# Patient Record
Sex: Male | Born: 1935 | Race: White | Hispanic: No | Marital: Married | Smoking: Former smoker
Health system: Southern US, Community
[De-identification: ages and names within clinical notes are randomized; demographics above are authoritative.]

## PROBLEM LIST (undated history)

## (undated) DIAGNOSIS — R32 Unspecified urinary incontinence: Secondary | ICD-10-CM

## (undated) DIAGNOSIS — I10 Essential (primary) hypertension: Secondary | ICD-10-CM

## (undated) DIAGNOSIS — I5032 Chronic diastolic (congestive) heart failure: Secondary | ICD-10-CM

## (undated) DIAGNOSIS — E785 Hyperlipidemia, unspecified: Secondary | ICD-10-CM

## (undated) DIAGNOSIS — I350 Nonrheumatic aortic (valve) stenosis: Secondary | ICD-10-CM

## (undated) DIAGNOSIS — M1990 Unspecified osteoarthritis, unspecified site: Secondary | ICD-10-CM

## (undated) DIAGNOSIS — R5382 Chronic fatigue, unspecified: Secondary | ICD-10-CM

## (undated) DIAGNOSIS — F0150 Vascular dementia without behavioral disturbance: Secondary | ICD-10-CM

## (undated) DIAGNOSIS — M1712 Unilateral primary osteoarthritis, left knee: Secondary | ICD-10-CM

## (undated) DIAGNOSIS — I351 Nonrheumatic aortic (valve) insufficiency: Secondary | ICD-10-CM

## (undated) DIAGNOSIS — F0390 Unspecified dementia without behavioral disturbance: Secondary | ICD-10-CM

## (undated) DIAGNOSIS — R0989 Other specified symptoms and signs involving the circulatory and respiratory systems: Secondary | ICD-10-CM

## (undated) DIAGNOSIS — N3281 Overactive bladder: Secondary | ICD-10-CM

## (undated) DIAGNOSIS — R233 Spontaneous ecchymoses: Secondary | ICD-10-CM

## (undated) DIAGNOSIS — R238 Other skin changes: Secondary | ICD-10-CM

## (undated) DIAGNOSIS — F419 Anxiety disorder, unspecified: Secondary | ICD-10-CM

## (undated) DIAGNOSIS — E7800 Pure hypercholesterolemia, unspecified: Secondary | ICD-10-CM

## (undated) DIAGNOSIS — N400 Enlarged prostate without lower urinary tract symptoms: Secondary | ICD-10-CM

## (undated) DIAGNOSIS — J309 Allergic rhinitis, unspecified: Secondary | ICD-10-CM

## (undated) DIAGNOSIS — I2510 Atherosclerotic heart disease of native coronary artery without angina pectoris: Secondary | ICD-10-CM

## (undated) DIAGNOSIS — R2681 Unsteadiness on feet: Secondary | ICD-10-CM

## (undated) DIAGNOSIS — Z952 Presence of prosthetic heart valve: Secondary | ICD-10-CM

## (undated) DIAGNOSIS — R51 Headache: Secondary | ICD-10-CM

## (undated) DIAGNOSIS — K219 Gastro-esophageal reflux disease without esophagitis: Secondary | ICD-10-CM

## (undated) HISTORY — DX: Overactive bladder: N32.81

## (undated) HISTORY — DX: Pure hypercholesterolemia, unspecified: E78.00

## (undated) HISTORY — DX: Other specified symptoms and signs involving the circulatory and respiratory systems: R09.89

## (undated) HISTORY — DX: Unilateral primary osteoarthritis, left knee: M17.12

## (undated) HISTORY — PX: TRANSURETHRAL RESECTION OF PROSTATE: SHX73

## (undated) HISTORY — DX: Essential (primary) hypertension: I10

## (undated) HISTORY — PX: OTHER SURGICAL HISTORY: SHX169

## (undated) HISTORY — PX: CARDIAC CATHETERIZATION: SHX172

## (undated) HISTORY — DX: Nonrheumatic aortic (valve) stenosis: I35.0

## (undated) HISTORY — PX: TURP VAPORIZATION: SUR1397

## (undated) HISTORY — PX: HEMORROIDECTOMY: SUR656

## (undated) HISTORY — DX: Vascular dementia, unspecified severity, without behavioral disturbance, psychotic disturbance, mood disturbance, and anxiety: F01.50

## (undated) HISTORY — PX: EYE SURGERY: SHX253

## (undated) HISTORY — DX: Chronic diastolic (congestive) heart failure: I50.32

## (undated) HISTORY — DX: Hyperlipidemia, unspecified: E78.5

## (undated) HISTORY — DX: Allergic rhinitis, unspecified: J30.9

## (undated) HISTORY — DX: Chronic fatigue, unspecified: R53.82

## (undated) HISTORY — PX: COLONOSCOPY W/ POLYPECTOMY: SHX1380

## (undated) HISTORY — DX: Nonrheumatic aortic (valve) insufficiency: I35.1

## (undated) HISTORY — PX: TONSILLECTOMY: SUR1361

---

## 2003-07-18 ENCOUNTER — Encounter: Admission: RE | Admit: 2003-07-18 | Discharge: 2003-07-18 | Payer: Self-pay | Admitting: Family Medicine

## 2003-09-14 ENCOUNTER — Ambulatory Visit (HOSPITAL_COMMUNITY): Admission: RE | Admit: 2003-09-14 | Discharge: 2003-09-16 | Payer: Self-pay | Admitting: Cardiology

## 2004-06-03 ENCOUNTER — Ambulatory Visit (HOSPITAL_COMMUNITY): Admission: RE | Admit: 2004-06-03 | Discharge: 2004-06-03 | Payer: Self-pay | Admitting: Cardiology

## 2005-05-27 ENCOUNTER — Encounter: Admission: RE | Admit: 2005-05-27 | Discharge: 2005-05-27 | Payer: Self-pay | Admitting: Family Medicine

## 2005-07-15 ENCOUNTER — Ambulatory Visit (HOSPITAL_COMMUNITY): Admission: RE | Admit: 2005-07-15 | Discharge: 2005-07-15 | Payer: Self-pay | Admitting: Cardiology

## 2005-07-15 ENCOUNTER — Encounter: Payer: Self-pay | Admitting: Vascular Surgery

## 2005-12-08 ENCOUNTER — Encounter (INDEPENDENT_AMBULATORY_CARE_PROVIDER_SITE_OTHER): Payer: Self-pay | Admitting: *Deleted

## 2005-12-08 ENCOUNTER — Ambulatory Visit (HOSPITAL_COMMUNITY): Admission: RE | Admit: 2005-12-08 | Discharge: 2005-12-10 | Payer: Self-pay | Admitting: Urology

## 2005-12-11 ENCOUNTER — Emergency Department (HOSPITAL_COMMUNITY): Admission: EM | Admit: 2005-12-11 | Discharge: 2005-12-11 | Payer: Self-pay | Admitting: Emergency Medicine

## 2007-08-12 ENCOUNTER — Inpatient Hospital Stay (HOSPITAL_BASED_OUTPATIENT_CLINIC_OR_DEPARTMENT_OTHER): Admission: RE | Admit: 2007-08-12 | Discharge: 2007-08-12 | Payer: Self-pay | Admitting: Cardiology

## 2007-08-18 ENCOUNTER — Ambulatory Visit (HOSPITAL_COMMUNITY): Admission: AD | Admit: 2007-08-18 | Discharge: 2007-08-19 | Payer: Self-pay | Admitting: Cardiovascular Disease

## 2010-06-25 NOTE — Cardiovascular Report (Signed)
NAMEROOK, MAUE               ACCOUNT NO.:  [id number]   MEDICAL RECORD NO.:  [id number]         PATIENT TYPE:  JCAR   LOCATION:                                 FACILITY:   PHYSICIAN:  Armanda Magic, M.D.     DATE OF BIRTH:  05/09/1933   DATE OF PROCEDURE:  DATE OF DISCHARGE:                            CARDIAC CATHETERIZATION   REFERRING PHYSICIAN:  Donia Guiles, MD   PROCEDURE:  Coronary angiography.   OPERATOR:  Trace Turner, MD   INDICATION:  Exertional shortness of breath and abnormal Cardiolite with  inferoseptal ischemia.   COMPLICATIONS:  None.   IV ACCESS:  Via right femoral artery, 4-French sheath.   IV MEDICATIONS:  1. Versed 1 mg.  2. Fentanyl 25 mcg.   This is a 77 year old male who has a history of coronary artery disease  status post PTCA with stenting of the LAD and circumflex in 2005, who  now presents with exertional shortness of breath.  Stress Cardiolite  study revealed an inferoseptal ischemia.  He now presents for cardiac  catheterization.   PROCEDURE:  The patient was brought to cardiac catheterization  laboratory in a fasting nonsedated state.  Informed consent was  obtained.  The patient was connected to continuous heart rate and pulse  oximetry monitoring and blood pressure monitoring.  The right groin was  prepped and draped in a sterile fashion.  A 1% Xylocaine was used for  local anesthesia.  Using the modified Seldinger technique, a 4-French  sheath was placed in the right femoral artery.  Under fluoroscopic  guidance, a 4-French JL-4 catheter was placed in the vicinity of the  left main coronary artery, but did not engage the ostium.  The catheter  was exchanged out over a guidewire for a 4-French JL-5 catheter, which  successfully engaged the coronary ostium.  Multiple cine films were  taken at 30-degree RAO and 40-degree LAO views.  The catheter was  exchanged out over a guidewire for a 4-French 3-D RCA catheter, which  successfully  engaged the right coronary artery.  Multiple cine films  were taken at 30-degree RAO and 40-degree LAO views.  This catheter was  then exchanged out over a guidewire for a 4-French angled pigtail  catheter, which was placed under fluoroscopic guidance with thinning of  the aortic valve.  After multiple attempts at probing the aortic valve,  the catheter could not cross the aortic valve into the left ventricular  cavity.  The catheter was removed over a guidewire.  At the end of the  procedure, all catheters and sheaths were removed.  Manual compression  was performed until adequate hemostasis was obtained.  The patient was  transferred back to room in stable condition.   RESULTS:  The left main coronary artery is widely patent and bifurcates  into left anterior descending artery and left circumflex artery.   The left anterior descending artery has a stent in the proximal portion  that is widely patent.  It gives rise to a very large first diagonal.  The first diagonal bifurcates into 2 daughter vessels.  The superior  branch then bifurcates again.  The inferior branch also bifurcates  again, and they are all widely patent.  The ongoing LAD has luminal  irregularities and is widely patent and traverses the apex.   The left circumflex gives rise to a very high first obtuse marginal  branch which is widely patent and rather large.  The ongoing circumflex  has luminal irregularities and traverses the AV groove.  It gives rise  to a small obtuse marginal branch and then terminates in a third obtuse  marginal branch, which is widely patent.   The right coronary artery is widely patent throughout the course with  luminal irregularities.  Distally, it bifurcates in a posterior  descending artery and posterior lateral artery.  The posterior lateral  artery is widely patent.  The posterior descending artery shows a 70%  stenosis in the proximal portion.  This does correlate with where the   patient's ischemia was in the inferior septum.   Left ventriculography was not performed because we could not cross the  aortic valve.   ASSESSMENT:  1. One-vessel obstructive coronary artery disease in the posterior      descending artery.  2. Normal left ventricular function by stress Cardiolite study.  3. Aortic valve calcification under fluoroscopy with history of aortic      valve sclerosis and aortic insufficiency.  I could not cross the      aortic valve after multiple attempts.   PLAN:  1. We will discharge to home after IV fluid and bedrest.  2. Check a 2-D echocardiogram to assess the aortic valve as an      outpatient.  3. We will review the films with Dr. Amil Amen in regards to the      posterior descending artery stenosis.  Otherwise, he will follow up      with my nurse practitioner in 2 weeks.      Armanda Magic, M.D.  Electronically Signed     TT/MEDQ  D:  08/12/2007  T:  08/13/2007  Job:  [id number]   cc:   Donia Guiles, M.D.

## 2010-06-28 NOTE — Op Note (Signed)
[name], [name]               ACCOUNT NO.:  [id number]   MEDICAL RECORD NO.:  [id number]          PATIENT TYPE:  OIB   LOCATION:  1416                         FACILITY:  [facility]   PHYSICIAN:  Bertram Millard. Dahlstedt, M.D.DATE OF BIRTH:  November 01, 1933   DATE OF PROCEDURE:  12/08/2005  DATE OF DISCHARGE:                                 OPERATIVE REPORT   PREOPERATIVE DIAGNOSIS:  Benign prostatic hypertrophy.   POSTOPERATIVE DIAGNOSIS:  Benign prostatic hypertrophy.   PROCEDURE:  TURP.   SURGEON:  Bertram Millard. Dahlstedt, M.D.   ANESTHESIA:  General with LMA.   COMPLICATIONS:  None.   SPECIMEN:  Prostate chips to pathology.   ESTIMATED BLOOD LOSS:  Minimal.   BRIEF HISTORY:  A 77 year old gentleman who is status post a TUNA procedure  3 years ago for BPH.  He had mild symptomatic relief.  Since that time, he  has had significant symptoms of an overactive bladder.  Oral  anticholinergics have not been much help.  He recently underwent urodynamic  studies which have shown the patient to have obstruction, but also bladder  instability.  Normally, he does not leak urine.   As the patient has had recurrent symptoms of prostatic outlet obstruction  since his TUNA, it was recommended that we proceed with a more aggressive  gold standard approach.  TURP has been discussed with the patient at  length.  He has received a recent brochure on this, is aware of the risks  and complications which include but are not limited to bleeding, infection,  regrowth of tissue with need for procedure down the road, urinary  incontinence, and overactive bladder symptoms which might necessitate  prolonged use of anticholinergics.  He understands these and desires to  proceed.   DESCRIPTION OF PROCEDURE:  The patient was identified in the holding area,  and received preoperative IV antibiotics there.  He was taken to the  operating room where general anesthetic was administered using LMA.  He was  placed  in the dorsal lithotomy position.  Genitalia and perineum were  prepped and draped.  His urethral meatus was calibrated to 30-French with  Sissy Hoff sounds.  The 28-French resectoscope sheath was placed.  The  bladder was inspected.  Minor trabeculations were noted.  Ureteral orifices  were inspected.  The resectoscope was then placed, and TURP was begun.  The  medium-sized median lobe was resected first from bladder neck to the  verumontanum, preserving the verumontanum as landmark for dissection.  The  dissection was carried down to the surgical capsule.  There was a fair  amount of bleeding.  Before the lateral lobes were taken down hemostasis was  achieved.  The right lobe and anterior prostate were then resected down to  the surgical capsule.  There was a fair amount of apical tissue that was  resected.  Following this, the resection was carried out on the left  prostatic lobe.  Again, dissection was carried down to the surgical capsule.  Chips were evacuated from the bladder.  Careful inspection of the prostatic  fossa was then carried out, and small  bleeders were carefully  electrocoagulated.  Reinspection of the bladder revealed no further chips  present.  The prostatic fossa was again cauterized.  At this point the scope  was removed.  The 22-French Foley catheter with three-way irrigation was  then placed and hooked to CBI.   The patient tolerated procedure well.  Chips were sent for pathologic  review.  He was then awakened and taken to PACU in stable condition.      Bertram Millard. Dahlstedt, M.D.  Electronically Signed     SMD/MEDQ  D:  12/08/2005  T:  12/08/2005  Job:  [id number]

## 2010-06-28 NOTE — Cardiovascular Report (Signed)
[name], [name]               ACCOUNT NO.:  [id number]   MEDICAL RECORD NO.:  [id number]          PATIENT TYPE:  OIB   LOCATION:  2856                         FACILITY:  [facility]   PHYSICIAN:  Armanda Magic, M.D.     DATE OF BIRTH:  August 26, 1933   DATE OF PROCEDURE:  06/04/2004  DATE OF DISCHARGE:  06/03/2004                              CARDIAC CATHETERIZATION   PROCEDURE:  Left heart catheterization ,coronary angiography, left  ventriculography.   OPERATOR:  Armanda Magic, M.D.   INDICATIONS:  Coronary disease. Repeat cardiac catheterization for research  protocol for Costar study.   COMPLICATIONS:  None.   IV ACCESS:  Via right femoral artery 6-French sheath.   This is a very pleasant 77 year old white male with a history of coronary  disease status post PTCA stenting of the mid LAD and proximal OM for  obstructive coronary disease. The patient has been enrolled in the Cozaar  study and now presents for routine cardiac catheterization 9 months status  post angioplasty per the Costar protocol.   The patient is brought to cardiac catheterization laboratory in a fasting  nonsedated state. Informed consent was obtained. The patient was connected  to continuous heart rate and pulse oximetry monitoring and intermittent  blood pressure monitoring. The right groin was prepped and draped in sterile  fashion. One percent Xylocaine was used for local anesthesia. Using the  modified Seldinger technique, a 6-French sheath was placed in the right  femoral artery. Under fluoroscopic guidance, a 6-French JL-4 catheter was  placed in left coronary artery. Multiple cine films were taken in the 30-  degree RAO and 40-degree LAO views. This catheter was exchanged out over a  guidewire for a 6-French JR-4 catheter which was placed under fluoroscopic  guidance in the right coronary artery. Multiple cine films were taken at 30-  degree RAO and 40-degree LAO views. This catheter was exchanged out  over a  guidewire for 6-French angled pigtail catheter which was placed under  fluoroscopic guidance in the left ventricular cavity. Left ventriculography  was performed in 30-degree RAO view using a total of 30 cc of contrast at 15  cc per second. The catheter was then pulled back across the aortic valve  with no significant gradient noted. At the end of procedure, all catheters  and sheaths were removed. Manual compression was performed until adequate  hemostasis was obtained. The patient was transferred back to the room in  stable condition.   RESULTS:  Left main coronary is widely patent and bifurcates into left  anterior descending artery and left circumflex artery. Left anterior  descending artery on initial engagement, the catheter had significant  vasospasm at the ostium, up to about 70%, but on further injection of  contrast, vasospasm resolved, but there was still about a 40-50% narrowing  of the ostial LAD. The ongoing LAD gave rise to two diagonal branches, both  of which are widely patent, and the rest of the LAD traversed the apex and  was widely patent. In  between the first and second diagonals, there is  evidence of a stent in  the mid LAD with a 30% in-stent restenosis. The left  circumflex is widely patent throughout its course, giving rise to a very  high obtuse marginal or ramus branch which has a stent in the ostium and  proximal vessel with 20% in-stent restenosis. The ongoing circumflex is  widely patent and traverses the AV groove, giving rise to a small obtuse  marginal 2 and then a larger obtuse marginal 3 branch which bifurcates into  two daughter branches and is widely patent.   The right coronary is widely patent throughout its course with luminal  irregularities up to 10-20%. It bifurcates distally in a posterior  descending artery and posterior lateral artery, both of which are widely  patent.   Left ventriculography shows normal LV systolic function, EF  60%. Aortic  pressure 120/73 mmHg, LV pressure 131 over 9 mmHg.   ASSESSMENT:  1.  Nonobstructive coronary disease.  2.  Normal left ventricular function.   PLAN:  Discharge home after IV fluid and bedrest. Follow up with nurse  practitioner in 2 weeks for groin check.       TT/MEDQ  D:  06/04/2004  T:  06/04/2004  Job:  [id number]   cc:   [employer]

## 2010-06-28 NOTE — Cardiovascular Report (Signed)
[name], [name]                         ACCOUNT NO.:  [id number]   MEDICAL RECORD NO.:  [id number]                   PATIENT TYPE:  OIB   LOCATION:  6523                                 FACILITY:  [facility]   PHYSICIAN:  Francisca December, M.D.               DATE OF BIRTH:  1933/04/05   DATE OF PROCEDURE:  09/14/2003  DATE OF DISCHARGE:                              CARDIAC CATHETERIZATION   PROCEDURE PERFORMED:  1. PCI/drug-eluting stent implantation mid left anterior descending.  2. PCI/drug-eluting stent implantation proximal ramus intermedius.  3. Percutaneous closure of right femoral artery.   INDICATIONS:  Mr. Viggo Perko is a 77 year old man who Dr. Armanda Magic  has recently completed coronary angiography on revealing a subtotal stenosis  in the mid to distal LAD of the ramus branch.  He has exertional dyspnea and  ischemia by Cardiolite.  He was brought to the catheterization laboratory at  this time to undergo percutaneous revascularization.   PROCEDURAL NOTE:  Through the previously placed 6-French catheter sheath a 6-  French #3.5 CLS guiding catheter was advanced to the ascending aorta where  the left coronary os was engaged.  The patient received initially 5700 units  of heparin resulting in an ACT of 220 and a subsequent dose of 2000 units of  heparin resulting in an ACT of 302 seconds.  He also received a 25  mcg/kg/minute bolus dose of Aggrastat followed by a 0.15 mg/kg/minute  maintenance infusion.  A 0.014 inch SciMed Luge intracoronary guidewire was  passed across the lesion in the mid LAD without difficulty.  Initial balloon  dilatation was performed with a 2.5 x 20 mm SciMed Maverick intracoronary  balloon inflated to 6 atmospheres.  This was deflated and removed.  A 2.75 x  16 mm SciMed Taxus intracoronary stent was advanced into place, carefully  positioned.  It was deployed there to a peak pressure of 9 atmospheres for  approximately 45 seconds.  The Taxus  balloon was then deflated and removed  and a 2.75 x 8 mm SciMed Quantum Maverick intracoronary balloon advanced  into place.  Inflated twice to a peak pressure of 16 atmospheres for not  greater than 40 seconds.  Following this, intravascular ultrasound was  performed with the SciMed Atlantis catheter.  A single mechanical pullback  was obtained while imaging throughout the stented segment.   The Luge wire was then withdrawn and advanced down the ramus intermedius  across the 90% lesion there.  Again, this was without difficulty.  Initial  balloon dilatation was performed with a 2.5 x 20 mm SciMed Maverick  intracoronary balloon.  This was inflated to 6 atmospheres for 55 seconds.  This was deflated and removed and a 2.75 x 24 mm SciMed Taxus intracoronary  drug-eluting stent was advanced into place.  This was carefully positioned  such that the proximal end was at the orifice of the ramus intermedius.  It  was inflated to a peak pressure of 14 atmospheres for 40 seconds.  The stent  balloon was deflated and removed and a 3.0 x 15 mm SciMed Quantum Maverick  intracoronary balloon was advanced into place.  This was inflated to 12  atmospheres for 55 seconds.  Again, intravascular ultrasound was completed  with the Atlantis catheter.  Following confirmation of adequate patency in  orthogonal views both with and without the guidewire in place for both  lesions, the guiding catheter was removed.  A right femoral arteriogram in a  45 degree angulation was performed via the right catheter femoral artery  sheath.  It revealed the femoral artery to be widely patent and the  arteriotomy site to be well above the bifurcation into the profunda femoris  and superficial femoral arteries.  Percutaneous closure was then performed  with the AngioSeal device which provided excellent hemostasis.  The patient  was transported to the recovery area in stable condition with an intact  distal pulse.    Angiography as mentioned.  The initial lesion treated was in the mid portion  of the anterior descending artery and was 80-90% stenotic.  It ended just  before the origin of a moderate sized second diagonal branch.  Following  balloon dilatation and stent implantation there was no visual residual  stenosis.  For the ramus intermedius this was a long lesion measuring about  20 mm in length from the orifice of the vessel down to the mid segment.  The  more tightly stenotic portion which was 90% stenotic was in the distal  portion.  Following balloon dilatation and stent implantation, again, there  was no residual stenosis visible.   Intravascular ultrasound revealed a 2.2 x 2.4 mm lumen distally in the mid  LAD stent.  It was 2.6 x 2.4 proximally in the LAD stent.  In the distal  portion of the ramus intermedius stent it was 2.7 x 2.6 mm and proximally it  was 2.7 x 2.5 mm.  All stents were well opposed to the arterial wall.   FINAL IMPRESSION:  1. Atherosclerotic coronary vascular disease, two vessel.  2. Status post successful PCI and drug-eluting stent implantation mid left     anterior descending and proximal ramus intermedius.  3. Typical angina was not reproduced with device insertion or balloon     inflation.                                               Francisca December, M.D.    JHE/MEDQ  D:  09/14/2003  T:  09/15/2003  Job:  [id number]   cc:   Donia Guiles, M.D.  [address] [place]  [place] [address]  Fax: [phone]

## 2010-06-28 NOTE — Cardiovascular Report (Signed)
NAMEBRYCE, CHEEVER                         ACCOUNT NO.:  [id number]   MEDICAL RECORD NO.:  [id number]                   PATIENT TYPE:  OIB   LOCATION:  2899                                 FACILITY:  [facility]   PHYSICIAN:  Armanda Magic, M.D.                  DATE OF BIRTH:  Apr 17, 1933   DATE OF PROCEDURE:  DATE OF DISCHARGE:                              CARDIAC CATHETERIZATION   REFERRING PHYSICIAN:  Donia Guiles, M.D.   PROCEDURE:  Left heart catheterization, coronary angiography, left  ventriculography.   SURGEON:  Armanda Magic, M.D.   INDICATIONS FOR PROCEDURE:  Shortness of breath, abnormal Cardiolite.   COMPLICATIONS:  None.   IV ACCESS:  Via right femoral artery 6 French sheath.   This is a very pleasant 77 year old white male with a history of  hypertension and chronic fatigue who has been having problems with shortness  of breath recently and was found to have an abnormal Cardiolite.  He was  also found to have some mild to moderate aortic insufficiency by 2-D  echocardiogram.  He now presents for cardiac catheterization because of  abnormal Cardiolite.   The patient was brought to the cardiac catheterization laboratory in a  fasting nonsedated state.  Informed consent was obtained.  The patient was  connected to continuous heart rate and pulse oximeter monitoring and  intermittent blood pressure monitoring.  The right groin was prepped and  draped in a sterile fashion.  Xylocaine 1% was used for local anesthesia.  Using a modified Seldinger technique, a 6 sheath was placed in the right  femoral artery.  Under fluoroscopic guidance, a 6 [place] JL 4 catheter was  placed in the left coronary artery.  Model was made and films were taken at  30 degree RAO, 40 degree LAO views.  Catheter was then exchanged out of a  guidewire for 6 [place] JR4 catheter which was placed under fluoroscopic  guidance in the right coronary artery.  Model was made, films were taken at  30  degree RAO and 40 degree LAO views.  Catheter was then exchanged out of a  guidewire for 6 French angled pigtail catheter which was placed under  fluoroscopy guidance in left ventricular cavity.  Left ventriculography was  performed in 30 degree RAO using a total of 30 mL of contrast at 15  cc/second.  Catheter was then pulled back across the aortic valve with no  significant gradient noted.  By the end of the procedure, the patient went  on to PCI of the LAD and ramus.   RESULTS:  Left main coronary artery is widely patent and trifurcates into a  ramus, left anterior descending and left circumflex branches.  The left  anterior descending coronary artery is patent in its proximal portion and  gives rise to a first small diagonal and then there was a 90% narrowing  between the first and second diagonal.  The second diagonal is patent.  The  ongoing left anterior descending to the apex is patent.  The ramus has a 70%  mid lesion and a moderate size vessel.  The left circumflex is widely patent  throughout its course and gives rise to an obtuse marginal branch which is  fairly large which has a 30% proximal narrowing and bifurcates distally into  two daughter branches.   The right coronary artery has  luminal irregularities up to 20% and  bifurcates distally in a posterior descending artery and posterolateral  artery, both of which are widely patent.   Left ventriculography shows normal left ventricular systolic function,  aortic pressure 133/83 mmHg.  Left ventricular pressure 142/9 mmHg.  LVEDP  in 14 mmHg.   ASSESSMENT:  1. Two vessel obstructive coronary artery disease of the left anterior     descending and ramus.  2. Normal left ventricular function.  3. Dyspnea.   PLAN:  Percutaneous coronary intervention of the left anterior descending  plus or minus ramus.  Start Plavix.  Continue aspirin.  Continue his Mavik,  Altace and hydrochlorothiazide and check a fasting lipid  panel.                                               Armanda Magic, M.D.    TT/MEDQ  D:  09/14/2003  T:  09/15/2003  Job:  [id number]   cc:   Donia Guiles, M.D.  [address] [place]  [place] [address]  Fax: [phone]

## 2010-11-07 LAB — BASIC METABOLIC PANEL
BUN: 13
BUN: 13
CO2: 24
CO2: 26
Chloride: 105
Chloride: 106
Creatinine, Ser: 0.85
Glucose, Bld: 117 — ABNORMAL HIGH
Potassium: 4.1

## 2010-11-07 LAB — CBC
HCT: 42.4
Hemoglobin: 14
MCHC: 34.4
MCHC: 34.6
MCHC: 34.6
MCV: 92.6
MCV: 92.8
MCV: 92.9
Platelets: 165
Platelets: 182
RBC: 4.39
RBC: 4.57
RDW: 13.3
WBC: 4.8

## 2011-04-08 ENCOUNTER — Other Ambulatory Visit: Payer: Self-pay | Admitting: Sports Medicine

## 2011-04-09 ENCOUNTER — Ambulatory Visit
Admission: RE | Admit: 2011-04-09 | Discharge: 2011-04-09 | Disposition: A | Discharge status: Home / Self Care | Payer: Medicare Other | Source: Ambulatory Visit | Attending: Sports Medicine | Admitting: Sports Medicine

## 2011-04-11 ENCOUNTER — Other Ambulatory Visit: Payer: Self-pay

## 2011-04-11 HISTORY — PX: ROTATOR CUFF REPAIR: SHX139

## 2011-05-06 ENCOUNTER — Ambulatory Visit (HOSPITAL_COMMUNITY)
Admission: RE | Admit: 2011-05-06 | Discharge: 2011-05-06 | Disposition: A | Discharge status: Home / Self Care | Payer: Medicare Other | Source: Ambulatory Visit | Attending: Anesthesiology | Admitting: Anesthesiology

## 2011-05-06 ENCOUNTER — Encounter (HOSPITAL_COMMUNITY): Payer: Self-pay

## 2011-05-06 ENCOUNTER — Encounter (HOSPITAL_COMMUNITY)
Admission: RE | Admit: 2011-05-06 | Discharge: 2011-05-06 | Disposition: A | Discharge status: Home / Self Care | Payer: Medicare Other | Source: Ambulatory Visit | Attending: Orthopedic Surgery | Admitting: Orthopedic Surgery

## 2011-05-06 DIAGNOSIS — I517 Cardiomegaly: Secondary | ICD-10-CM | POA: Insufficient documentation

## 2011-05-06 DIAGNOSIS — Z01818 Encounter for other preprocedural examination: Secondary | ICD-10-CM | POA: Insufficient documentation

## 2011-05-06 DIAGNOSIS — Z01812 Encounter for preprocedural laboratory examination: Secondary | ICD-10-CM | POA: Insufficient documentation

## 2011-05-06 DIAGNOSIS — M47814 Spondylosis without myelopathy or radiculopathy, thoracic region: Secondary | ICD-10-CM | POA: Insufficient documentation

## 2011-05-06 HISTORY — DX: Gastro-esophageal reflux disease without esophagitis: K21.9

## 2011-05-06 HISTORY — DX: Headache: R51

## 2011-05-06 HISTORY — DX: Atherosclerotic heart disease of native coronary artery without angina pectoris: I25.10

## 2011-05-06 HISTORY — DX: Essential (primary) hypertension: I10

## 2011-05-06 HISTORY — DX: Anxiety disorder, unspecified: F41.9

## 2011-05-06 HISTORY — DX: Benign prostatic hyperplasia without lower urinary tract symptoms: N40.0

## 2011-05-06 LAB — COMPREHENSIVE METABOLIC PANEL
BUN: 13 mg/dL (ref 6–23)
CO2: 28 mEq/L (ref 19–32)
Calcium: 9.6 mg/dL (ref 8.4–10.5)
Creatinine, Ser: 0.81 mg/dL (ref 0.50–1.35)
GFR calc Af Amer: >90 mL/min (ref >90)
GFR calc non Af Amer: 84 mL/min — ABNORMAL LOW (ref >90)
Glucose, Bld: 107 mg/dL — ABNORMAL HIGH (ref 70–99)
Total Bilirubin: 1.3 mg/dL — ABNORMAL HIGH (ref 0.3–1.2)

## 2011-05-06 LAB — CBC
Hemoglobin: 15.5 g/dL (ref 13.0–17.0)
MCHC: 35.4 g/dL (ref 30.0–36.0)
Platelets: 211 10*3/uL (ref 150–400)
RBC: 4.79 MIL/uL (ref 4.22–5.81)

## 2011-05-06 NOTE — Pre-Procedure Instructions (Signed)
20 [name]  05/06/2011   Your procedure is scheduled on:  May 08, 2011  Report to [facility] at 0800 AM.  Call this number if you have problems the morning of surgery: [phone]   Remember:   Do not eat food:After Midnight.  May have clear liquids: up to 4 Hours before arrival.  Clear liquids include soda, tea, black coffee, apple or grape juice, broth.  Take these medicines the morning of surgery with A SIP OF WATER: Norvasc, Coreg, Prilosec,    Do not wear jewelry, make-up or nail polish.  Do not wear lotions, powders, or perfumes. You may wear deodorant.  Do not shave 48 hours prior to surgery.  Do not bring valuables to the hospital.  Contacts, dentures or bridgework may not be worn into surgery.  Leave suitcase in the car. After surgery it may be brought to your room.  For patients admitted to the hospital, checkout time is 11:00 AM the day of discharge.   Patients discharged the day of surgery will not be allowed to drive home.  Name and phone number of your driver: Francesco Provencal  Special Instructions: Incentive Spirometry - Practice and bring it with you on the day of surgery. and CHG Shower Use Special Wash: 1/2 bottle night before surgery and 1/2 bottle morning of surgery.   Please read over the following fact sheets that you were given: Pain Booklet, Coughing and Deep Breathing and Surgical Site Infection Prevention

## 2011-05-07 MED ORDER — CEFAZOLIN SODIUM-DEXTROSE 2-3 GM-% IV SOLR
2.0000 g | INTRAVENOUS | Status: AC
  Administered 2011-05-08: 2 g via INTRAVENOUS
  Filled 2011-05-07: qty 50

## 2011-05-07 MED ORDER — LACTATED RINGERS IV SOLN
INTRAVENOUS | Status: DC
  Administered 2011-05-08: 50 mL/h via INTRAVENOUS

## 2011-05-07 MED ORDER — CHLORHEXIDINE GLUCONATE 4 % EX LIQD: 60.0000 mL | Freq: Once | CUTANEOUS | Status: DC

## 2011-05-08 ENCOUNTER — Encounter (HOSPITAL_COMMUNITY): Payer: Self-pay | Admitting: Anesthesiology

## 2011-05-08 ENCOUNTER — Encounter (HOSPITAL_COMMUNITY): Payer: Self-pay | Admitting: Surgery

## 2011-05-08 ENCOUNTER — Other Ambulatory Visit: Payer: Self-pay

## 2011-05-08 ENCOUNTER — Ambulatory Visit (HOSPITAL_COMMUNITY)
Admission: RE | Admit: 2011-05-08 | Discharge: 2011-05-08 | Disposition: A | Discharge status: Home / Self Care | Payer: Medicare Other | Source: Ambulatory Visit | Attending: Orthopedic Surgery | Admitting: Orthopedic Surgery

## 2011-05-08 ENCOUNTER — Ambulatory Visit (HOSPITAL_COMMUNITY): Payer: Medicare Other | Admitting: Anesthesiology

## 2011-05-08 ENCOUNTER — Encounter (HOSPITAL_COMMUNITY)
Admission: RE | Disposition: A | Discharge status: Home / Self Care | Payer: Self-pay | Source: Ambulatory Visit | Attending: Orthopedic Surgery

## 2011-05-08 DIAGNOSIS — I10 Essential (primary) hypertension: Secondary | ICD-10-CM | POA: Insufficient documentation

## 2011-05-08 DIAGNOSIS — K219 Gastro-esophageal reflux disease without esophagitis: Secondary | ICD-10-CM | POA: Insufficient documentation

## 2011-05-08 DIAGNOSIS — Z9889 Other specified postprocedural states: Secondary | ICD-10-CM

## 2011-05-08 DIAGNOSIS — I359 Nonrheumatic aortic valve disorder, unspecified: Secondary | ICD-10-CM | POA: Insufficient documentation

## 2011-05-08 DIAGNOSIS — M25819 Other specified joint disorders, unspecified shoulder: Secondary | ICD-10-CM | POA: Insufficient documentation

## 2011-05-08 DIAGNOSIS — M249 Joint derangement, unspecified: Secondary | ICD-10-CM | POA: Insufficient documentation

## 2011-05-08 DIAGNOSIS — I2510 Atherosclerotic heart disease of native coronary artery without angina pectoris: Secondary | ICD-10-CM | POA: Insufficient documentation

## 2011-05-08 DIAGNOSIS — M719 Bursopathy, unspecified: Secondary | ICD-10-CM | POA: Insufficient documentation

## 2011-05-08 DIAGNOSIS — M19019 Primary osteoarthritis, unspecified shoulder: Secondary | ICD-10-CM | POA: Insufficient documentation

## 2011-05-08 DIAGNOSIS — R51 Headache: Secondary | ICD-10-CM | POA: Insufficient documentation

## 2011-05-08 DIAGNOSIS — F411 Generalized anxiety disorder: Secondary | ICD-10-CM | POA: Insufficient documentation

## 2011-05-08 DIAGNOSIS — M67919 Unspecified disorder of synovium and tendon, unspecified shoulder: Secondary | ICD-10-CM | POA: Insufficient documentation

## 2011-05-08 DIAGNOSIS — M24119 Other articular cartilage disorders, unspecified shoulder: Secondary | ICD-10-CM | POA: Insufficient documentation

## 2011-05-08 SURGERY — SHOULDER ARTHROSCOPY WITH ROTATOR CUFF REPAIR AND SUBACROMIAL DECOMPRESSION
Anesthesia: General | Site: Shoulder | Laterality: Right | Wound class: Clean

## 2011-05-08 MED ORDER — MIDAZOLAM HCL 5 MG/5ML IJ SOLN
INTRAMUSCULAR | Status: DC | PRN
  Administered 2011-05-08 (×2): 1 mg via INTRAVENOUS

## 2011-05-08 MED ORDER — CYCLOBENZAPRINE HCL 10 MG PO TABS: 10.0000 mg | ORAL_TABLET | Freq: Three times a day (TID) | ORAL | Status: AC | PRN

## 2011-05-08 MED ORDER — TEMAZEPAM 30 MG PO CAPS: 30.0000 mg | ORAL_CAPSULE | Freq: Every evening | ORAL | Status: DC | PRN

## 2011-05-08 MED ORDER — HYDROMORPHONE HCL PF 1 MG/ML IJ SOLN
0.2500 mg | INTRAMUSCULAR | Status: DC | PRN
  Administered 2011-05-08: 0.5 mg via INTRAVENOUS

## 2011-05-08 MED ORDER — MIDAZOLAM HCL 2 MG/2ML IJ SOLN
INTRAMUSCULAR | Status: AC
  Filled 2011-05-08: qty 2

## 2011-05-08 MED ORDER — GLYCOPYRROLATE 0.2 MG/ML IJ SOLN
INTRAMUSCULAR | Status: DC | PRN
  Administered 2011-05-08: 0.4 mg via INTRAVENOUS

## 2011-05-08 MED ORDER — ROPIVACAINE HCL 5 MG/ML IJ SOLN
INTRAMUSCULAR | Status: DC | PRN
  Administered 2011-05-08: 150 mg via EPIDURAL

## 2011-05-08 MED ORDER — ONDANSETRON HCL 4 MG/2ML IJ SOLN
INTRAMUSCULAR | Status: DC | PRN
  Administered 2011-05-08: 4 mg via INTRAVENOUS

## 2011-05-08 MED ORDER — NEOSTIGMINE METHYLSULFATE 1 MG/ML IJ SOLN
INTRAMUSCULAR | Status: DC | PRN
  Administered 2011-05-08: 3 mg via INTRAVENOUS

## 2011-05-08 MED ORDER — OXYCODONE-ACETAMINOPHEN 5-325 MG PO TABS: 1.0000 | ORAL_TABLET | ORAL | Status: AC | PRN

## 2011-05-08 MED ORDER — PROPOFOL 10 MG/ML IV EMUL
INTRAVENOUS | Status: DC | PRN
  Administered 2011-05-08: 200 mg via INTRAVENOUS

## 2011-05-08 MED ORDER — LACTATED RINGERS IV SOLN
INTRAVENOUS | Status: DC | PRN
  Administered 2011-05-08: 10:00:00 via INTRAVENOUS

## 2011-05-08 MED ORDER — NAPROXEN 500 MG PO TABS: 500.0000 mg | ORAL_TABLET | Freq: Two times a day (BID) | ORAL | Status: DC

## 2011-05-08 MED ORDER — EPINEPHRINE HCL 0.1 MG/ML IJ SOLN
INTRAMUSCULAR | Status: DC | PRN
  Administered 2011-05-08: 15 ug via INTRAVENOUS

## 2011-05-08 MED ORDER — ROCURONIUM BROMIDE 100 MG/10ML IV SOLN
INTRAVENOUS | Status: DC | PRN
  Administered 2011-05-08: 50 mg via INTRAVENOUS

## 2011-05-08 MED ORDER — PHENYLEPHRINE HCL 10 MG/ML IJ SOLN
INTRAMUSCULAR | Status: DC | PRN
  Administered 2011-05-08 (×3): 80 ug via INTRAVENOUS

## 2011-05-08 MED ORDER — FENTANYL CITRATE 0.05 MG/ML IJ SOLN
INTRAMUSCULAR | Status: DC | PRN
  Administered 2011-05-08: 100 ug via INTRAVENOUS
  Administered 2011-05-08: 50 ug via INTRAVENOUS
  Administered 2011-05-08: 100 ug via INTRAVENOUS

## 2011-05-08 MED ORDER — DROPERIDOL 2.5 MG/ML IJ SOLN: 0.6250 mg | INTRAMUSCULAR | Status: DC | PRN

## 2011-05-08 MED ORDER — FENTANYL CITRATE 0.05 MG/ML IJ SOLN
INTRAMUSCULAR | Status: AC
  Filled 2011-05-08: qty 2

## 2011-05-08 SURGICAL SUPPLY — 62 items
ANCH SUT 2 FT CRKSW 14.7X5.5 (Anchor) ×2 IMPLANT
ANCH SUT SWLK 19.1X5.5 CLS EL (Anchor) ×2 IMPLANT
ANCHOR CORKSCREW FIBER 5.5X15 (Anchor) ×2 IMPLANT
ANCHOR PEEK SWIVEL LOCK 5.5 (Anchor) ×2 IMPLANT
BLADE CUTTER GATOR 3.5 (BLADE) ×2 IMPLANT
BLADE GREAT WHITE 4.2 (BLADE) ×2 IMPLANT
BLADE SURG 11 STRL SS (BLADE) ×2 IMPLANT
BOOTCOVER CLEANROOM LRG (PROTECTIVE WEAR) ×4 IMPLANT
BUR 3.5 LG SPHERICAL (BURR) IMPLANT
BUR OVAL 4.0 (BURR) ×2 IMPLANT
BURR 3.5 LG SPHERICAL (BURR)
CANISTER SUCT LVC 12 LTR MEDI- (MISCELLANEOUS) ×2 IMPLANT
CANNULA ACUFLEX KIT 5X76 (CANNULA) ×2 IMPLANT
CANNULA DRILOCK 5.0X75 (CANNULA) ×4 IMPLANT
CLOTH BEACON ORANGE TIMEOUT ST (SAFETY) ×2 IMPLANT
CLSR STERI-STRIP ANTIMIC 1/2X4 (GAUZE/BANDAGES/DRESSINGS) ×1 IMPLANT
CONNECTOR 5 IN 1 STRAIGHT STRL (MISCELLANEOUS) ×2 IMPLANT
DRAPE INCISE 23X17 IOBAN STRL (DRAPES) ×1
DRAPE INCISE 23X17 STRL (DRAPES) ×1 IMPLANT
DRAPE INCISE IOBAN 23X17 STRL (DRAPES) ×1 IMPLANT
DRAPE STERI 35X30 U-POUCH (DRAPES) ×2 IMPLANT
DRAPE SURG 17X11 SM STRL (DRAPES) ×2 IMPLANT
DRAPE U-SHAPE 47X51 STRL (DRAPES) ×2 IMPLANT
DRSG PAD ABDOMINAL 8X10 ST (GAUZE/BANDAGES/DRESSINGS) ×5 IMPLANT
DURAPREP 26ML APPLICATOR (WOUND CARE) ×2 IMPLANT
ELECT REM PT RETURN 9FT ADLT (ELECTROSURGICAL) ×2
ELECTRODE REM PT RTRN 9FT ADLT (ELECTROSURGICAL) ×1 IMPLANT
GLOVE BIO SURGEON STRL SZ7.5 (GLOVE) ×2 IMPLANT
GLOVE BIO SURGEON STRL SZ8 (GLOVE) ×2 IMPLANT
GLOVE EUDERMIC 7 POWDERFREE (GLOVE) ×2 IMPLANT
GLOVE SS BIOGEL STRL SZ 7.5 (GLOVE) ×1 IMPLANT
GLOVE SUPERSENSE BIOGEL SZ 7.5 (GLOVE) ×1
GOWN STRL NON-REIN LRG LVL3 (GOWN DISPOSABLE) ×2 IMPLANT
GOWN STRL REIN XL XLG (GOWN DISPOSABLE) ×8 IMPLANT
KIT BASIN OR (CUSTOM PROCEDURE TRAY) ×2 IMPLANT
KIT ROOM TURNOVER OR (KITS) ×2 IMPLANT
KIT SHOULDER TRACTION (DRAPES) ×2 IMPLANT
MANIFOLD NEPTUNE II (INSTRUMENTS) ×2 IMPLANT
NDL SPNL 18GX3.5 QUINCKE PK (NEEDLE) ×1 IMPLANT
NDL SUT 6 .5 CRC .975X.05 MAYO (NEEDLE) IMPLANT
NEEDLE MAYO TAPER (NEEDLE)
NEEDLE SPNL 18GX3.5 QUINCKE PK (NEEDLE) ×2 IMPLANT
NS IRRIG 1000ML POUR BTL (IV SOLUTION) ×2 IMPLANT
PACK SHOULDER (CUSTOM PROCEDURE TRAY) ×2 IMPLANT
PAD ARMBOARD 7.5X6 YLW CONV (MISCELLANEOUS) ×4 IMPLANT
SET ARTHROSCOPY TUBING (MISCELLANEOUS) ×2
SET ARTHROSCOPY TUBING LN (MISCELLANEOUS) ×1 IMPLANT
SLING ARM IMMOBILIZER LRG (SOFTGOODS) ×2 IMPLANT
SLING ARM IMMOBILIZER MED (SOFTGOODS) IMPLANT
SPONGE GAUZE 4X4 12PLY (GAUZE/BANDAGES/DRESSINGS) ×2 IMPLANT
SPONGE LAP 4X18 X RAY DECT (DISPOSABLE) ×2 IMPLANT
STRIP CLOSURE SKIN 1/2X4 (GAUZE/BANDAGES/DRESSINGS) ×2 IMPLANT
SUT MNCRL AB 3-0 PS2 18 (SUTURE) ×2 IMPLANT
SUT PDS AB 0 CT 36 (SUTURE) IMPLANT
SUT RETRIEVER GRASP 30 DEG (SUTURE) ×1 IMPLANT
SYR 20CC LL (SYRINGE) ×2 IMPLANT
TAPE CLOTH SURG 6X10 WHT LF (GAUZE/BANDAGES/DRESSINGS) ×1 IMPLANT
TAPE PAPER 3X10 WHT MICROPORE (GAUZE/BANDAGES/DRESSINGS) ×2 IMPLANT
TOWEL OR 17X24 6PK STRL BLUE (TOWEL DISPOSABLE) ×2 IMPLANT
TOWEL OR 17X26 10 PK STRL BLUE (TOWEL DISPOSABLE) ×2 IMPLANT
WAND SUCTION MAX 4MM 90S (SURGICAL WAND) ×2 IMPLANT
WATER STERILE IRR 1000ML POUR (IV SOLUTION) ×2 IMPLANT

## 2011-05-08 NOTE — Anesthesia Preprocedure Evaluation (Addendum)
Anesthesia Evaluation    Airway Mallampati: II TM Distance: >3 FB Neck ROM: Full    Dental  (+) Teeth Intact and Dental Advisory Given   Pulmonary    Pulmonary exam normal       Cardiovascular hypertension, Pt. on medications + CAD + Valvular Problems/Murmurs AS Rhythm:Regular Rate:Normal - Systolic murmurs    Neuro/Psych  Headaches, Anxiety    GI/Hepatic Neg liver ROS, GERD-  Controlled,  Endo/Other    Renal/GU negative Renal ROS     Musculoskeletal   Abdominal   Peds  Hematology   Anesthesia Other Findings   Reproductive/Obstetrics                          Anesthesia Physical Anesthesia Plan  ASA: III  Anesthesia Plan: General   Post-op Pain Management:    Induction: Intravenous  Airway Management Planned: Oral ETT  Additional Equipment:   Intra-op Plan:   Post-operative Plan:   Informed Consent: I have reviewed the patients History and Physical, chart, labs and discussed the procedure including the risks, benefits and alternatives for the proposed anesthesia with the patient or authorized representative who has indicated his/her understanding and acceptance.   Dental advisory given  Plan Discussed with: CRNA, Anesthesiologist and Surgeon  Anesthesia Plan Comments:         Anesthesia Quick Evaluation

## 2011-05-08 NOTE — Discharge Instructions (Signed)
Vania Rea. Supple, M.D., F.A.A.O.S. Orthopaedic Surgery Specializing in Arthroscopic and Reconstructive Surgery of the Shoulder and Knee [phone] [address] [place], [place] [address] - Fax [phone]  POST-OP SHOULDER ARTHROSCOPIC ROTATOR CUFF AND/OR LABRAL REPAIR INSTRUCTIONS  1. Call the office at [phone] to schedule your first post-op appointment 7-10 days from the date of your surgery.  2. Leave the steri-strips in place over your incisions when performing dressing changes and showering. You may remove your dressings and begin showering 72 hours from surgery. You can expect drainage that is clear to bloody in nature that occasionally will soak through your dressings. If this occurs go ahead and perform a dressing change. The drainage should lessen daily and when there is no drainage from your incisions feel free to go without a dressing.  3. Wear your sling/immobilizer at all times except to perform the exercises below or to occasionally let your arm dangle by your side to stretch your elbow. You also need to sleep in your sling immobilizer until instructed otherwise.  4. Range of motion to your elbow, wrist, and hand are encouraged 3-5 times daily. Exercise to your hand and fingers helps to reduce swelling you may experience.  5. Utilize ice to the shoulder 3-4 times minimum a day and additionally if you are experiencing pain.  6. You may one-armed drive when safely off of narcotics and muscle relaxants. You may use your hand that is in the sling to support the steering wheel only. However, should it be your right arm that is in the sling it is not to be used for gear shifting in a manual transmission.  7. If you had a block pre-operatively to provide post-op pain relief you may want to go ahead and begin utilizing your pain meds as your arm begins to wake up. Blocks can sometimes last up to 16-18 hours. If you are still pain-free prior to going to bed you may  want to strongly consider taking a pain medication to avoid being awakened in the night with the onset of pain. A muscle relaxant is also provided for you should you experience muscle spasms. It is recommended that if you are experiencing pain that your pain medication alone is not controlling, add the muscle relaxant along with the pain medication which can give additional pain relief. The first one to two days is generally the most severe of your pain and then should gradually decrease. As your pain lessens it is recommended that you decrease your use of the pain medications to an "as needed basis" only and to always comply with the recommended dosages of the pain medications.  8. Pain medications can produce constipation along with their use. If you experience this, the use of an over the counter stool softener or laxative daily is recommended.   9. For additional questions or concerns, please do not hesitate to call the office. If after hours there is an answering service to forward your concerns to the physician on call.  POST-OP EXERCISES  Pendulum Exercises  Perform pendulum exercises while standing and bending at the waist. Support your uninvolved arm on a table or chair and allow your operated arm to hang freely. Make sure to do these exercises passively - not using you shoulder muscle.  Repeat 20 times. Do 3 sessions per day.   Instructions Following General Anesthetic, Adult A nurse specialized in giving anesthesia (anesthetist) or a doctor specialized in giving anesthesia (anesthesiologist) gave you a medicine that made  you sleep while a procedure was performed. For as long as 24 hours following this procedure, you may feel:  Dizzy.   Weak.   Drowsy.  AFTER THE PROCEDURE After surgery, you will be taken to the recovery area where a nurse will monitor your progress. You will be allowed to go home when you are awake, stable, taking fluids well, and without complications. For the  first 24 hours following an anesthetic:  Have a responsible person with you.   Do not drive a car. If you are alone, do not take public transportation.   Do not drink alcohol.   Do not take medicine that has not been prescribed by your caregiver.   Do not sign important papers or make important decisions.   You may resume normal diet and activities as directed.   Change bandages (dressings) as directed.   Only take over-the-counter or prescription medicines for pain, discomfort, or fever as directed by your caregiver.  If you have questions or problems that seem related to the anesthetic, call the hospital and ask for the anesthetist or anesthesiologist on call. SEEK IMMEDIATE MEDICAL CARE IF:   You develop a rash.   You have difficulty breathing.   You have chest pain.   You develop any allergic problems.  Document Released: 05/05/2000 Document Revised: 01/16/2011 Document Reviewed: 12/14/2006 [facility] Patient Information 2012 [place], [place].

## 2011-05-08 NOTE — Anesthesia Postprocedure Evaluation (Signed)
Anesthesia Post Note  Patient: [name]  Procedure(s) Performed: Procedure(s) (LRB): SHOULDER ARTHROSCOPY WITH ROTATOR CUFF REPAIR AND SUBACROMIAL DECOMPRESSION (Right)  Anesthesia type: general  Patient location: PACU  Post pain: Pain level controlled  Post assessment: Patient's Cardiovascular Status Stable  Last Vitals:  Filed Vitals:   05/08/11 1415  BP: 114/97  Pulse: 82  Temp:   Resp: 18    Post vital signs: Reviewed and stable  Level of consciousness: sedated  Complications: No apparent anesthesia complications

## 2011-05-08 NOTE — Preoperative (Signed)
Beta Blockers   Reason not to administer Beta Blockers:Not Applicable 

## 2011-05-08 NOTE — H&P (Signed)
[name]    Chief Complaint: right shoulder inpingment  HPI: The patient is a 78 y.o. male with large retracted right shoulder rotator cuff tear  Past Medical History  Diagnosis Date  . Hypertension   . Heart murmur   . BPH (benign prostatic hypertrophy)   . GERD (gastroesophageal reflux disease)   . Anxiety   . Coronary artery disease     Dr. Mayford Knife cardiologist, echo 02/17/11 stress 12/11/09  . Headache     relieved with OTC's  . Rotator cuff tear, right     Past Surgical History  Procedure Date  . Transurethral resection of prostate   . Turp vaporization   . Cardiac catheterization     3 stents first cath 2 stents placed then additional stent placed  . Hemorroidectomy     Family History  Problem Relation Age of Onset  . Anesthesia problems Neg Hx     Social History:  reports that he quit smoking about 20 years ago. His smoking use included Cigars. He does not have any smokeless tobacco history on file. He reports that he drinks about 3 ounces of alcohol per week. He reports that he does not use illicit drugs.  Allergies:  Allergies  Allergen Reactions  . Sulfa Antibiotics Hives and Rash    Medications Prior to Admission  Medication Dose Route Frequency Provider Last Rate Last Dose  . ceFAZolin (ANCEF) IVPB 2 g/50 mL premix  2 g Intravenous 60 min Pre-Op Ralene Bathe, PA      . chlorhexidine (HIBICLENS) 4 % liquid 4 application  60 mL Topical Once [employer], PA      . lactated ringers infusion   Intravenous Continuous Ralene Bathe, PA       No current outpatient prescriptions on file as of 05/08/2011.     Physical Exam: Right shoulder with painful and restricted right shoulder motion with aexam as noted at 04/16/11 office visit  Vitals  Temp:  [97.6 F (36.4 C)] 97.6 F (36.4 C) (03/28 0810) Pulse Rate:  [61] 61  (03/28 0810) Resp:  [18] 18  (03/28 0810) BP: (160)/(81) 160/81 mmHg (03/28 0810) SpO2:  [97 %] 97 % (03/28  0810)  Assessment/Plan  Impression: right shoulder inpingment with rotator cuff tear  Plan of Action: Procedure(s): SHOULDER ARTHROSCOPY WITH ROTATOR CUFF REPAIR AND SUBACROMIAL DECOMPRESSION, DCR  Deren Degrazia M 05/08/2011, 9:31 AM

## 2011-05-08 NOTE — Transfer of Care (Signed)
Immediate Anesthesia Transfer of Care Note  Patient: [name]  Procedure(s) Performed: Procedure(s) (LRB): SHOULDER ARTHROSCOPY WITH ROTATOR CUFF REPAIR AND SUBACROMIAL DECOMPRESSION (Right)  Patient Location: PACU  Anesthesia Type: General  Level of Consciousness: awake, sedated and patient cooperative  Airway & Oxygen Therapy: Patient Spontanous Breathing and Patient connected to face mask oxygen  Post-op Assessment: Report given to PACU RN, Post -op Vital signs reviewed and stable and Patient moving all extremities X 4  Post vital signs: Reviewed and stable  Complications: No apparent anesthesia complications

## 2011-05-08 NOTE — Op Note (Signed)
05/08/2011  12:22 PM  PATIENT:   [name]  78 y.o. male  PRE-OPERATIVE DIAGNOSIS:  right shoulder impingement   POST-OPERATIVE DIAGNOSIS:  Same with rotator cuff tear and AC joint OA and labral tear and bicep tear PROCEDURE:  RSA,bicep tenotomy,labral debridement, SAD, DCR, RCR SURGEON:  Loree Shehata, Vania Rea M.D.  ASSISTANTS: Shuford pac   ANESTHESIA:   GET + ISB  EBL: min  SPECIMEN:  none  Drains: none   PATIENT DISPOSITION:  PACU - hemodynamically stable.    PLAN OF CARE: Discharge to home after PACU  Dictation# [id number]

## 2011-05-08 NOTE — Progress Notes (Signed)
Dr [name] at bedside and aware pt o2 sats drop to 88 at times.   States pt need to stay in pacu another hour to be monitored.

## 2011-05-08 NOTE — Anesthesia Procedure Notes (Addendum)
Anesthesia Regional Block:  Interscalene brachial plexus block  Pre-Anesthetic Checklist: ,, timeout performed, Correct Patient, Correct Site, Correct Laterality, Correct Procedure,, site marked, risks and benefits discussed, Surgical consent,  Pre-op evaluation,  At surgeon's request and post-op pain management  Laterality: Right  Prep: chloraprep       Needles:  Injection technique: Single-shot  Needle Type: Echogenic Stimulator Needle     Needle Length: 5cm 5 cm Needle Gauge: 22 and 22 G    Additional Needles:  Procedures: ultrasound guided and nerve stimulator Interscalene brachial plexus block  Nerve Stimulator or Paresthesia:  Response: bicep contraction, 0.45 mA,   Additional Responses:   Narrative:  Start time: 05/08/2011 9:34 AM End time: 05/08/2011 9:44 AM Injection made incrementally with aspirations every 5 mL.  Performed by: Personally  Anesthesiologist: J. Adonis Huguenin, MD  Additional Notes: Functioning IV was confirmed and monitors applied.  A 50mm 22ga echogenic arrow stimulator was used. Sterile prep and drape,hand hygiene and sterile gloves were used.Ultrasound guidance: relevent anatomy identified, needle position confirmed, local anesthetic spread visualized around nerve(s)., vascular puncture avoided.  Image printed for medical record.  Negative aspiration and negative test dose prior to incremental administration of local anesthetic. The patient tolerated the procedure well.  Interscalene brachial plexus block Procedure Name: Intubation Date/Time: 05/08/2011 10:23 AM Performed by: Sharlene Dory E Pre-anesthesia Checklist: Patient identified, Emergency Drugs available, Suction available, Patient being monitored and Timeout performed Patient Re-evaluated:Patient Re-evaluated prior to inductionOxygen Delivery Method: Circle system utilized Preoxygenation: Pre-oxygenation with 100% oxygen Intubation Type: IV induction Ventilation: Mask ventilation without  difficulty and Oral airway inserted - appropriate to patient size Laryngoscope Size: Mac and 3 Grade View: Grade II Tube type: Oral Tube size: 7.5 mm Number of attempts: 1 Airway Equipment and Method: Stylet Placement Confirmation: ETT inserted through vocal cords under direct vision,  breath sounds checked- equal and bilateral and positive ETCO2 Secured at: 22 cm Tube secured with: Tape Dental Injury: Injury to lip  Comments: Intubation per Marion Downer, RN. ([facility] nurse)

## 2011-05-09 NOTE — Op Note (Signed)
NAMEMALIKE, FOGLIO               ACCOUNT NO.:  [id number]  MEDICAL RECORD NO.:  [id number]  LOCATION:  [facility]                         FACILITY:  [facility]  PHYSICIAN:  Vania Rea. Brielle Moro, M.D.  DATE OF BIRTH:  [birth year]  DATE OF PROCEDURE:  05/08/2011 DATE OF DISCHARGE:  05/08/2011                              OPERATIVE REPORT   PREOPERATIVE DIAGNOSES: 1. Large retracted right shoulder rotator cuff tear. 2. Right shoulder impingement syndrome. 3. Right shoulder acromioclavicular joint arthrosis.  POSTOPERATIVE DIAGNOSES: 1. Large retracted right shoulder rotator cuff tear. 2. Right shoulder impingement syndrome. 3. Right shoulder acromioclavicular joint arthrosis. 4. Right shoulder biceps tendon tear. 5. Right shoulder labral tear.  PROCEDURE: 1. Right shoulder examination under anesthesia. 2. Right shoulder glenoid joint diagnostic arthroscopy. 3. Arthroscopic biceps tendon tenotomy. 4. Debridement of complex and extensive labral tear. 5. Arthroscopic subacromial decompression bursectomy. 6. Arthroscopic distal clavicle resection. 7. Arthroscopic rotator cuff repair using a double-row suture-bridge     repair construct.  SURGEON:  Vania Rea. Haskell Rihn, M.D.  Threasa HeadsFrench Ana A. Shuford, PA-C.  ANESTHESIA:  General endotracheal as well as an interscalene block.  ESTIMATED BLOOD LOSS:  Minimal.  DRAINS:  None.  HISTORY:  Mr. Chura is a 78 year old gentleman who has had persistent right shoulder pain after an injury in which he sustained a large retracted tear of the rotator cuff.  He subsequently brought to the operating at this time for planned right shoulder arthroscopy as described below.  Preoperatively, I counseled Mr. Shropshire on treatment options as well as risks versus benefits thereof.  Possible surgical complications were reviewed including the potential for bleeding, infection, neurovascular injury, persistent pain, loss of motion, anesthetic  complication, recurrence of rotator cuff tear, possible need for additional surgery. He understands and accepts and agrees with our planned procedure.  PROCEDURE IN DETAIL:  After undergoing routine preop evaluation, the patient received prophylactic antibiotics, and an interscalene block was established in the holding area by the Anesthesia Department.  Placed supine on the op table, underwent smooth induction of general endotracheal anesthesia.  Turned to left lateral decubitus position on the beanbag and appropriately padded and protected.  Right shoulder examination under anesthesia revealed full motion, no instability patterns were noted.  Right arm was suspended at 70 degrees of abduction with 10 pounds of traction.  The right shoulder girdle region was sterilely prepped and draped in standard fashion.  Time-out was called. Posterior portal was established in glenohumeral joint and anterior portal was established under direct visualization.  The articular surfaces were in good condition.  There was, however, severe degenerative tearing of the midportion of the intra-articular segment of the biceps tendon, and given the degree of degeneration, fraying and tearing, we proceeded with biceps tenotomy and completed with the Stryker wand.  There was also a very large posteroinferior labral tear, which we debrided with a shaver back to stable margin.  Diffuse synovitis was noted in the glenohumeral joint and in addition, we found a very large rotator cuff tear involving the entire supra and infraspinatus extending into the upper teres minor.  At this point, fluid and instruments were removed from the glenohumeral joint.  The arm was  dropped down to 30 degrees of abduction with the arthroscope introduced in the subacromial space through the posterior portal and a direct lateral port was established in the subacromial space.  Abundant dense bursal tissue and multiple adhesions were  encountered, and these were divided and excised with the combination of shaver and Stryker wand.  The wand was then used to remove the periosteum from the undersurface of the anterior half of the acromion.  The subacromial decompression was performed with a bur creating type 1 morphology. Portal was then established directly anterior to the distal clavicle and distal clavicle resection was performed with a bur.  Care was taken to confirm visualization of the entire circumference of distal clavicle to ensure adequate removal of bone.  We then completed the subacromial/subdeltoid bursectomy.  We then mobilized the rotator cuff by releasing adhesions on both the bursal and articular sides. Extensive releases were necessary to gain proper immobilization.  We were ultimately able to bring the margin of the rotator cuff tear back to the tuberosity.  We then established an accessory portal device from the 3-step drill in the lateral margin of the acromion and placed 2 Arthrex PEEK Corkscrew suture anchors.  The series of suture limbs were then passed through the free margin of the rotator cuff in a horizontal- mattress pattern traversing the width of the tear.  These all were then tied sequentially from posterior to anterior with sliding-locking knots followed by multiple overhand throws and alternating posts.  We then created a "suture bridge" with 2 SwiveLock suture anchors incorporating all of the suture limbs at the lateral aspect of the tuberosity, which reinforced repair and compression of the margin of the rotator cuff against the bony-bed and tuberosity.  Overall construct was much to our satisfaction.  At this point, final inspection and irrigation was completed.  Suture limbs were clipped.  Fluid and instruments were removed.  Ralene Bathe, PA-C, was utilized throughout this case, essential for surgical assistant to assist with positioning of the extremity, management of the  arthroscopic equipment, tissue management, suture management, wound closure, and intraoperative decision making.  The wounds were closed with Monocryl and Steri-Strips.  A bulky dry dressing was then taped at the right shoulder.  Right arm was placed in a sling.  The patient was awakened, extubated, and taken to the recovery room in stable condition.    Vania Rea. Dylin Breeden, M.D.    KMS/MEDQ  D:  05/08/2011  T:  05/09/2011  Job:  [id number]

## 2011-05-13 ENCOUNTER — Ambulatory Visit: Payer: Medicare Other | Attending: Orthopedic Surgery | Admitting: Physical Therapy

## 2011-05-13 DIAGNOSIS — M25619 Stiffness of unspecified shoulder, not elsewhere classified: Secondary | ICD-10-CM | POA: Insufficient documentation

## 2011-05-13 DIAGNOSIS — R5381 Other malaise: Secondary | ICD-10-CM | POA: Insufficient documentation

## 2011-05-13 DIAGNOSIS — M25519 Pain in unspecified shoulder: Secondary | ICD-10-CM | POA: Insufficient documentation

## 2011-05-13 DIAGNOSIS — IMO0001 Reserved for inherently not codable concepts without codable children: Secondary | ICD-10-CM | POA: Insufficient documentation

## 2011-05-16 ENCOUNTER — Ambulatory Visit: Payer: Medicare Other | Admitting: *Deleted

## 2011-05-19 ENCOUNTER — Ambulatory Visit: Payer: Medicare Other | Admitting: Physical Therapy

## 2011-05-21 ENCOUNTER — Ambulatory Visit: Payer: Medicare Other | Admitting: Physical Therapy

## 2011-05-23 ENCOUNTER — Ambulatory Visit: Payer: Medicare Other | Admitting: *Deleted

## 2011-05-26 ENCOUNTER — Ambulatory Visit: Payer: Medicare Other | Admitting: Physical Therapy

## 2011-05-28 ENCOUNTER — Encounter: Payer: Medicare Other | Admitting: Physical Therapy

## 2011-05-28 ENCOUNTER — Ambulatory Visit: Payer: Medicare Other | Admitting: Physical Therapy

## 2011-05-30 ENCOUNTER — Ambulatory Visit: Payer: Medicare Other | Admitting: Physical Therapy

## 2011-06-02 ENCOUNTER — Ambulatory Visit: Payer: Medicare Other | Admitting: Physical Therapy

## 2011-06-04 ENCOUNTER — Ambulatory Visit: Payer: Medicare Other | Admitting: Physical Therapy

## 2011-06-06 ENCOUNTER — Ambulatory Visit: Payer: Medicare Other | Admitting: *Deleted

## 2011-06-09 ENCOUNTER — Ambulatory Visit: Payer: Medicare Other | Admitting: Physical Therapy

## 2011-06-11 ENCOUNTER — Ambulatory Visit: Payer: Medicare Other | Attending: Orthopedic Surgery | Admitting: Physical Therapy

## 2011-06-11 DIAGNOSIS — R5381 Other malaise: Secondary | ICD-10-CM | POA: Insufficient documentation

## 2011-06-11 DIAGNOSIS — M25519 Pain in unspecified shoulder: Secondary | ICD-10-CM | POA: Insufficient documentation

## 2011-06-11 DIAGNOSIS — IMO0001 Reserved for inherently not codable concepts without codable children: Secondary | ICD-10-CM | POA: Insufficient documentation

## 2011-06-11 DIAGNOSIS — M25619 Stiffness of unspecified shoulder, not elsewhere classified: Secondary | ICD-10-CM | POA: Insufficient documentation

## 2011-06-13 ENCOUNTER — Ambulatory Visit: Payer: Medicare Other | Admitting: Physical Therapy

## 2011-06-16 ENCOUNTER — Ambulatory Visit: Payer: Medicare Other | Admitting: Physical Therapy

## 2011-06-18 ENCOUNTER — Ambulatory Visit: Payer: Medicare Other | Admitting: Physical Therapy

## 2011-06-20 ENCOUNTER — Encounter: Payer: Medicare Other | Admitting: *Deleted

## 2011-06-23 ENCOUNTER — Ambulatory Visit: Payer: Medicare Other | Admitting: Physical Therapy

## 2011-06-25 ENCOUNTER — Ambulatory Visit: Payer: Medicare Other | Admitting: Physical Therapy

## 2011-06-27 ENCOUNTER — Encounter: Payer: Medicare Other | Admitting: Physical Therapy

## 2011-06-30 ENCOUNTER — Encounter: Payer: Medicare Other | Admitting: Physical Therapy

## 2011-07-02 ENCOUNTER — Ambulatory Visit: Payer: Medicare Other | Admitting: Physical Therapy

## 2011-07-04 ENCOUNTER — Ambulatory Visit: Payer: Medicare Other | Admitting: Physical Therapy

## 2011-07-09 ENCOUNTER — Encounter: Payer: Medicare Other | Admitting: Physical Therapy

## 2011-07-11 ENCOUNTER — Ambulatory Visit: Payer: Medicare Other | Admitting: Physical Therapy

## 2011-08-05 ENCOUNTER — Other Ambulatory Visit: Payer: Self-pay | Admitting: Family Medicine

## 2011-08-05 DIAGNOSIS — R270 Ataxia, unspecified: Secondary | ICD-10-CM

## 2011-08-07 ENCOUNTER — Ambulatory Visit
Admission: RE | Admit: 2011-08-07 | Discharge: 2011-08-07 | Disposition: A | Discharge status: Home / Self Care | Payer: Medicare Other | Source: Ambulatory Visit | Attending: Family Medicine | Admitting: Family Medicine

## 2011-08-07 DIAGNOSIS — R270 Ataxia, unspecified: Secondary | ICD-10-CM

## 2011-08-07 MED ORDER — GADOBENATE DIMEGLUMINE 529 MG/ML IV SOLN
20.0000 mL | Freq: Once | INTRAVENOUS | Status: AC | PRN
  Administered 2011-08-07: 20 mL via INTRAVENOUS

## 2011-08-21 ENCOUNTER — Emergency Department (HOSPITAL_BASED_OUTPATIENT_CLINIC_OR_DEPARTMENT_OTHER): Payer: Medicare Other

## 2011-08-21 ENCOUNTER — Emergency Department (HOSPITAL_BASED_OUTPATIENT_CLINIC_OR_DEPARTMENT_OTHER)
Admission: EM | Admit: 2011-08-21 | Discharge: 2011-08-21 | Disposition: A | Discharge status: Home / Self Care | Payer: Medicare Other | Attending: Emergency Medicine | Admitting: Emergency Medicine

## 2011-08-21 ENCOUNTER — Encounter (HOSPITAL_BASED_OUTPATIENT_CLINIC_OR_DEPARTMENT_OTHER): Payer: Self-pay

## 2011-08-21 DIAGNOSIS — Z87891 Personal history of nicotine dependence: Secondary | ICD-10-CM | POA: Insufficient documentation

## 2011-08-21 DIAGNOSIS — M25569 Pain in unspecified knee: Secondary | ICD-10-CM | POA: Insufficient documentation

## 2011-08-21 DIAGNOSIS — I2510 Atherosclerotic heart disease of native coronary artery without angina pectoris: Secondary | ICD-10-CM | POA: Insufficient documentation

## 2011-08-21 DIAGNOSIS — F411 Generalized anxiety disorder: Secondary | ICD-10-CM | POA: Insufficient documentation

## 2011-08-21 DIAGNOSIS — K219 Gastro-esophageal reflux disease without esophagitis: Secondary | ICD-10-CM | POA: Insufficient documentation

## 2011-08-21 DIAGNOSIS — Z7982 Long term (current) use of aspirin: Secondary | ICD-10-CM | POA: Insufficient documentation

## 2011-08-21 DIAGNOSIS — I10 Essential (primary) hypertension: Secondary | ICD-10-CM | POA: Insufficient documentation

## 2011-08-21 DIAGNOSIS — Z79899 Other long term (current) drug therapy: Secondary | ICD-10-CM | POA: Insufficient documentation

## 2011-08-21 LAB — BASIC METABOLIC PANEL
CO2: 24 mEq/L (ref 19–32)
Calcium: 9.1 mg/dL (ref 8.4–10.5)
Creatinine, Ser: 0.9 mg/dL (ref 0.50–1.35)
GFR calc non Af Amer: 81 mL/min — ABNORMAL LOW (ref >90)
Sodium: 143 mEq/L (ref 135–145)

## 2011-08-21 LAB — CBC WITH DIFFERENTIAL/PLATELET
Basophils Absolute: 0 10*3/uL (ref 0.0–0.1)
Eosinophils Absolute: 0.2 10*3/uL (ref 0.0–0.7)
Eosinophils Relative: 3 % (ref 0–5)
HCT: 43.3 % (ref 39.0–52.0)
Lymphocytes Relative: 21 % (ref 12–46)
MCH: 30.5 pg (ref 26.0–34.0)
MCHC: 34.9 g/dL (ref 30.0–36.0)
MCV: 87.5 fL (ref 78.0–100.0)
Monocytes Absolute: 0.6 10*3/uL (ref 0.1–1.0)
Platelets: 199 10*3/uL (ref 150–400)
RDW: 13.8 % (ref 11.5–15.5)

## 2011-08-21 MED ORDER — OXYCODONE-ACETAMINOPHEN 5-325 MG PO TABS: 1.0000 | ORAL_TABLET | Freq: Four times a day (QID) | ORAL | Status: AC | PRN

## 2011-08-21 MED ORDER — OXYCODONE-ACETAMINOPHEN 5-325 MG PO TABS
1.0000 | ORAL_TABLET | Freq: Once | ORAL | Status: AC
  Administered 2011-08-21: 1 via ORAL
  Filled 2011-08-21: qty 1

## 2011-08-21 NOTE — ED Provider Notes (Signed)
History     CSN: [id number]  Arrival date & time 08/21/11  1458   First MD Initiated Contact with Patient 08/21/11 1542      Chief Complaint  Patient presents with  . Leg Pain    (Consider location/radiation/quality/duration/timing/severity/associated sxs/prior treatment) Patient is a 78 y.o. male presenting with leg pain. The history is provided by the patient.  Leg Pain  The incident occurred 6 to 12 hours ago. The incident occurred at home. There was no injury mechanism. The pain is present in the left knee. The quality of the pain is described as aching and sharp. The pain is at a severity of 10/10. The pain is moderate. The pain has been constant since onset. Associated symptoms include inability to bear weight. Pertinent negatives include no numbness, no loss of motion, no muscle weakness, no loss of sensation and no tingling.   patient with acute onset of left knee pain is more to the medial side today no injury no past problem with the knee has had some trouble with some bilateral leg pain. Has had a recent evaluation by his primary care Dr. identified multiple small strokes in the past but none that are known to the patient. Patient and family concerned about a stroke or either blood clot.  Past Medical History  Diagnosis Date  . Hypertension   . Heart murmur   . BPH (benign prostatic hypertrophy)   . GERD (gastroesophageal reflux disease)   . Anxiety   . Coronary artery disease     Dr. Mayford Knife cardiologist, echo 02/17/11 stress 12/11/09  . Headache     relieved with OTC's  . Rotator cuff tear, right     Past Surgical History  Procedure Date  . Transurethral resection of prostate   . Turp vaporization   . Cardiac catheterization     3 stents first cath 2 stents placed then additional stent placed  . Hemorroidectomy     Family History  Problem Relation Age of Onset  . Anesthesia problems Neg Hx     History  Substance Use Topics  . Smoking status: Former Smoker    Quit date: 02/11/1991  . Smokeless tobacco: Not on file  . Alcohol Use: 3.0 oz/week    5 Glasses of wine per week     weekly      Review of Systems  Constitutional: Negative for fever and chills.  HENT: Negative for neck pain.   Eyes: Positive for visual disturbance.  Respiratory: Negative for shortness of breath.   Cardiovascular: Negative for chest pain.  Gastrointestinal: Negative for abdominal pain.  Genitourinary: Negative for dysuria.  Musculoskeletal: Positive for joint swelling and gait problem. Negative for myalgias and back pain.  Skin: Negative for rash.  Neurological: Negative for tingling, numbness and headaches.  Hematological: Does not bruise/bleed easily.    Allergies  Sulfa antibiotics  Home Medications   Current Outpatient Rx  Name Route Sig Dispense Refill  . AMLODIPINE BESYLATE 10 MG PO TABS Oral Take 10 mg by mouth daily.    . ASPIRIN 81 MG PO TABS Oral Take 81 mg by mouth daily.    [name] Kitchen CARVEDILOL 12.5 MG PO TABS Oral Take 12.5 mg by mouth 2 (two) times daily with a meal.    . CLOPIDOGREL BISULFATE 75 MG PO TABS Oral Take 75 mg by mouth daily.    [name] Kitchen HYDROCHLOROTHIAZIDE 25 MG PO TABS Oral Take 25 mg by mouth daily.    . IBUPROFEN 200 MG PO TABS Oral  Take 600 mg by mouth every 6 (six) hours as needed. For pain    . MIRABEGRON ER 25 MG PO TB24 Oral Take 50 mg by mouth daily.    . ADULT MULTIVITAMIN W/MINERALS CH Oral Take 1 tablet by mouth daily.    [name] Kitchen OMEPRAZOLE 40 MG PO CPDR Oral Take 40 mg by mouth daily.    . OXYCODONE-ACETAMINOPHEN 5-325 MG PO TABS Oral Take 1 tablet by mouth every 4 (four) hours as needed. For pain    . POTASSIUM CHLORIDE CRYS ER 20 MEQ PO TBCR Oral Take 20 mEq by mouth daily.    [name] Kitchen ROSUVASTATIN CALCIUM 20 MG PO TABS Oral Take 20 mg by mouth daily.    . SERTRALINE HCL 50 MG PO TABS Oral Take 50 mg by mouth daily.     [name] Kitchen NITROGLYCERIN 0.4 MG SL SUBL Sublingual Place 0.4 mg under the tongue every 5 (five) minutes as needed. For chest pain     . OXYCODONE-ACETAMINOPHEN 5-325 MG PO TABS Oral Take 1 tablet by mouth every 6 (six) hours as needed for pain. 15 tablet 0    BP 156/71  Pulse 61  Temp 98.7 F (37.1 C) (Oral)  Resp 16  Ht 6\' 2"  (1.88 m)  Wt 240 lb (108.863 kg)  BMI 30.81 kg/m2  SpO2 97%  Physical Exam  Nursing note and vitals reviewed. Constitutional: He is oriented to person, place, and time. He appears well-developed and well-nourished. No distress.  HENT:  Head: Normocephalic and atraumatic.  Mouth/Throat: Oropharynx is clear and moist.  Eyes: Conjunctivae and EOM are normal. Pupils are equal, round, and reactive to light.  Neck: Normal range of motion. Neck supple.  Cardiovascular: Normal rate, regular rhythm, normal heart sounds and intact distal pulses.   No murmur heard. Abdominal: Soft. Bowel sounds are normal. There is no tenderness.  Musculoskeletal: Normal range of motion. He exhibits tenderness.       Left knee no redness but increased warmth no effusion no swelling pain with range of motion. Distally patient has good cap refill sensation intact motor intact.  Neurological: He is alert and oriented to person, place, and time. No cranial nerve deficit. He exhibits normal muscle tone. Coordination normal.  Skin: Skin is warm. No rash noted. No erythema.    ED Course  Procedures (including critical care time)  Labs Reviewed  BASIC METABOLIC PANEL - Abnormal; Notable for the following:    Glucose, Bld 103 (*)     GFR calc non Af Amer 81 (*)     All other components within normal limits  CBC WITH DIFFERENTIAL   US Venous Img Lower Unilateral Left  08/21/2011  *RADIOLOGY REPORT*  Clinical Data: Left knee pain.  LEFT LOWER EXTREMITY VENOUS DUPLEX ULTRASOUND  Technique:  Gray-scale sonography with graded compression, as well as color Doppler and duplex ultrasound were performed to evaluate the deep venous system of the lower extremity from the level of the common femoral vein through the popliteal and  proximal calf veins. Spectral Doppler was utilized to evaluate flow at rest and with distal augmentation maneuvers.  Comparison:  None.  Findings:  Normal compressibility of the common femoral, superficial femoral, and popliteal veins is demonstrated, as well as the visualized proximal calf veins.  No filling defects to suggest DVT on grayscale or color Doppler imaging.  Doppler waveforms show normal direction of venous flow, normal respiratory phasicity and response to augmentation. The left great saphenous vein is compressible.  There may be a  small amount of subcutaneous edema or fluid in the posterior knee region.  IMPRESSION: No evidence of left lower extremity deep vein thrombosis.  Question a small amount of fluid or edema in the posterior knee.  Original Report Authenticated By: Richarda Overlie, M.D.   Dg Knee Complete 4 Views Left  08/21/2011  *RADIOLOGY REPORT*  Clinical Data: 78 year old male with sudden onset knee pain.  No known injury.  LEFT KNEE - COMPLETE 4+ VIEW  Comparison: None.  Findings: Evidence of a small suprapatellar joint effusion.  There may be a small calcified loose body within the lateral joint space anteriorly.  Joint spaces are preserved.  Patella intact.  No acute fracture.  Calcified atherosclerosis in the left lower extremity.  IMPRESSION: Possible small joint effusion and lateral joint space loose body. No acute osseous abnormality identified about the left knee.  Original Report Authenticated By: Harley Hallmark, M.D.     1. Knee pain, acute       MDM  Workup of the left knee pain is most likely consistent with an arthritic process whether that be gout or pseudogout or inflammatory arthritis or chronic changes within the knee. No evidence of deep vein thrombosis. Patient's pain improved with Percocet in the emergency part. We'll treat with knee immobilizer and referral to his orthopedic doctor Dr. supple for further followup.      Shelda Jakes, MD 08/21/11  [phone]

## 2011-08-21 NOTE — ED Notes (Signed)
Pt reports bilateral leg pain x 3 weeks and developed severe left leg pain this am.

## 2011-09-03 ENCOUNTER — Other Ambulatory Visit (HOSPITAL_COMMUNITY): Payer: Self-pay | Admitting: Neurology

## 2011-09-03 DIAGNOSIS — I2510 Atherosclerotic heart disease of native coronary artery without angina pectoris: Secondary | ICD-10-CM

## 2011-09-04 ENCOUNTER — Ambulatory Visit (HOSPITAL_COMMUNITY): Payer: Medicare Other | Attending: Cardiology | Admitting: Radiology

## 2011-09-04 DIAGNOSIS — I379 Nonrheumatic pulmonary valve disorder, unspecified: Secondary | ICD-10-CM | POA: Insufficient documentation

## 2011-09-04 DIAGNOSIS — I10 Essential (primary) hypertension: Secondary | ICD-10-CM | POA: Insufficient documentation

## 2011-09-04 DIAGNOSIS — I079 Rheumatic tricuspid valve disease, unspecified: Secondary | ICD-10-CM | POA: Insufficient documentation

## 2011-09-04 DIAGNOSIS — E785 Hyperlipidemia, unspecified: Secondary | ICD-10-CM | POA: Insufficient documentation

## 2011-09-04 DIAGNOSIS — G459 Transient cerebral ischemic attack, unspecified: Secondary | ICD-10-CM | POA: Insufficient documentation

## 2011-09-04 DIAGNOSIS — I2510 Atherosclerotic heart disease of native coronary artery without angina pectoris: Secondary | ICD-10-CM

## 2011-09-04 DIAGNOSIS — I359 Nonrheumatic aortic valve disorder, unspecified: Secondary | ICD-10-CM | POA: Insufficient documentation

## 2011-09-04 DIAGNOSIS — E669 Obesity, unspecified: Secondary | ICD-10-CM | POA: Insufficient documentation

## 2011-09-04 NOTE — Progress Notes (Signed)
Echocardiogram performed.  

## 2011-09-08 ENCOUNTER — Encounter (HOSPITAL_COMMUNITY): Payer: Self-pay | Admitting: Neurology

## 2011-09-18 ENCOUNTER — Encounter (HOSPITAL_COMMUNITY): Payer: Self-pay | Admitting: Pharmacy Technician

## 2011-09-18 ENCOUNTER — Encounter (HOSPITAL_COMMUNITY)
Admission: RE | Admit: 2011-09-18 | Discharge: 2011-09-18 | Disposition: A | Discharge status: Home / Self Care | Payer: Medicare Other | Source: Ambulatory Visit | Attending: Orthopedic Surgery | Admitting: Orthopedic Surgery

## 2011-09-18 ENCOUNTER — Encounter (HOSPITAL_COMMUNITY): Payer: Self-pay

## 2011-09-18 HISTORY — DX: Spontaneous ecchymoses: R23.3

## 2011-09-18 HISTORY — DX: Unspecified dementia, unspecified severity, without behavioral disturbance, psychotic disturbance, mood disturbance, and anxiety: F03.90

## 2011-09-18 HISTORY — DX: Other skin changes: R23.8

## 2011-09-18 HISTORY — DX: Unsteadiness on feet: R26.81

## 2011-09-18 LAB — CBC
HCT: 47.8 % (ref 39.0–52.0)
Hemoglobin: 16.6 g/dL (ref 13.0–17.0)
MCH: 30.6 pg (ref 26.0–34.0)
MCHC: 34.7 g/dL (ref 30.0–36.0)
RDW: 13.8 % (ref 11.5–15.5)

## 2011-09-18 LAB — SURGICAL PCR SCREEN: MRSA, PCR: NEGATIVE

## 2011-09-18 NOTE — Progress Notes (Signed)
09/18/11 1014  OBSTRUCTIVE SLEEP APNEA  Have you ever been diagnosed with sleep apnea through a sleep study? No  Do you snore loudly (loud enough to be heard through closed doors)?  0  Do you often feel tired, fatigued, or sleepy during the daytime? 1  Has anyone observed you stop breathing during your sleep? 0  Do you have, or are you being treated for high blood pressure? 1  BMI more than 35 kg/m2? 1  Age over 75 years old? 1  Neck circumference greater than 40 cm/18 inches? 0  Gender: 1  Obstructive Sleep Apnea Score 5   Score 4 or greater  Updated health history;Results sent to PCP

## 2011-09-18 NOTE — H&P (Signed)
  [name] DOB: Jul 20, 1933  Chief Complaint: left knee pain   History of Present Illness The patient is a 78 year old male who is scheduled for a left knee arthroscopy with medial menisectomy by Dr. Darrelyn Hillock on September 19, 2011. The patient is being followed for their left knee pain. They are now 4 weeks out from when symptoms began. Symptoms reported today include pain and pain with weightbearing.  The patient has not gotten any relief of their symptoms with Cortisone injections.  He states he squatted down 4 weeks ago and when he went to stand he felt a sharp pain medially. He has fallen several times since June. MIR revealed medial meniscus tear in the left knee.    Problem List/Past Medical S/P rotator cuff repair (V45.89) Sprain/strain rotator cuff, traumatic (840.4) Bleeding disorder Gastroesophageal Reflux Disease Coronary artery disease Hypercholesterolemia Hypertension Heart Murmur   Allergies Sulfa Drugs. Hives.   Family History Hypertension. brother Diabetes Mellitus. brother Cancer. father   Social History Illicit drug use. no Exercise. Exercises weekly; does other Marital status. married Tobacco / smoke exposure. no Current work status. retired Number of flights of stairs before winded. 2-3 Drug/Alcohol Rehab (Previously). no Drug/Alcohol Rehab (Currently). no Pain Contract. no Alcohol use. current drinker; drinks beer and wine; only occasionally per week Children. 3 Living situation. live with spouse Tobacco use. former smoker   Past Surgical History Colon Polyp Removal - Colonoscopy Prostatectomy; Transurethral Heart Stents Hemorrhoidectomy     Review of Systems General:Present- Fatigue. Not Present- Chills, Fever, Night Sweats, Appetite Loss, Feeling sick, Weight Gain and Weight Loss. Skin:Not Present- Itching, Rash, Skin Color Changes, Ulcer, Psoriasis and Change in Hair or Nails. HEENT:Not  Present- Sensitivity to light, Hearing problems, Nose Bleed and Ringing in the Ears. Neck:Not Present- Swollen Glands and Neck Mass. Respiratory:Present- Snoring and Dyspnea. Not Present- Chronic Cough and Bloody sputum. Cardiovascular:Present- Shortness of Breath and Swelling of Extremities. Not Present- Chest Pain, Leg Cramps and Palpitations. Gastrointestinal:Not Present- Bloody Stool, Heartburn, Abdominal Pain, Vomiting, Nausea and Incontinence of Stool. Male Genitourinary:Present- Frequency, Incontinence and Nocturia. Not Present- Blood in Urine. Musculoskeletal:Present- Muscle Weakness, Muscle Pain, Joint Stiffness and Joint Pain. Not Present- Joint Swelling and Back Pain. Neurological:Present- Burning. Not Present- Tingling, Numbness, Tremor, Headaches and Dizziness. Psychiatric:Not Present- Anxiety, Depression and Memory Loss. Endocrine:Present- Excessive Thirst. Not Present- Cold Intolerance, Heat Intolerance and Excessive hunger. Hematology:Present- Abnormal Bleeding and Easy Bruising. Not Present- Anemia and Blood Clots.  Vitals BP: 135/88 Wt: 246 lbs  Physical Exam He is in significant pain in the left knee. He has a knee effusion. He is reluctant to flex and extend his knee. His cruciate and collateral ligaments appear to be intact. The popliteal space is fine.  Skin: He has marked venous stasis changes in his skin distally. He does have an excellent posterior tibial pulse. Lungs: Clear. Heart: Normal sinus rhythm .  Oral cavity is negative. Neck: supple, no caroid bruits Abdomen: soft and nontender   Assessment & Plan Complex tear of the medial meniscus of left knee Left knee arthroscopy with medial menisectomy       Dimitri Ped, PA-C

## 2011-09-18 NOTE — Patient Instructions (Signed)
22 [name]  09/18/2011   Your procedure is scheduled on:  09/19/11  Friday  Surgery 1400-1500  Report to [facility] at  1130     AM.  Call this number if you have problems the morning of surgery: [phone]     Or PST   [id number]  [facility]   Remember:   Do not eat food:After Midnight.tonight  May have clear liquids:until  0530   Friday  THEN NONE   Clear liquids include soda, tea, black coffee, apple or grape juice, broth.  Take these medicines the morning of surgery with A SIP OF WATER:CARVEDILOL, PROLISEC, SERTALINE                                               MAY TAKE NORCO, NITROGLYCERIN IF NEEDED   Do not wear jewelry, make-up or nail polish.  Do not wear lotions, powders, or perfumes. You may wear deodorant.  Do not shave 48 hours prior to surgery.  Do not bring valuables to the hospital.  Contacts, dentures or bridgework may not be worn into surgery.  Leave suitcase in the car. After surgery it may be brought to your room.  For patients admitted to the hospital, checkout time is 11:00 AM the day of discharge.   Patients discharged the day of surgery will not be allowed to drive home.  Name and phone number of your driver:  wife                                                                    Special Instructions: CHG Shower Use Special Wash: 1/2 bottle night before surgery and 1/2 bottle morning of surgery. REGULAR SOAP FACE AND PRIVATES                              MEN-MAY SHAVE FACE MORNING OF SURGERY  Please read over the following fact sheets that you were given: MRSA Information

## 2011-09-18 NOTE — Pre-Procedure Instructions (Signed)
Clearance note with LOV Dr Mayford Knife 06/23/11 on chart, eccho  1/13 and stress test 11/11 on chart

## 2011-09-19 ENCOUNTER — Ambulatory Visit (HOSPITAL_COMMUNITY): Payer: Medicare Other | Admitting: Anesthesiology

## 2011-09-19 ENCOUNTER — Encounter (HOSPITAL_COMMUNITY): Payer: Self-pay | Admitting: Anesthesiology

## 2011-09-19 ENCOUNTER — Ambulatory Visit (HOSPITAL_COMMUNITY)
Admission: RE | Admit: 2011-09-19 | Discharge: 2011-09-19 | Disposition: A | Discharge status: Home / Self Care | Payer: Medicare Other | Source: Ambulatory Visit | Attending: Orthopedic Surgery | Admitting: Orthopedic Surgery

## 2011-09-19 ENCOUNTER — Encounter (HOSPITAL_COMMUNITY)
Admission: RE | Disposition: A | Discharge status: Home / Self Care | Payer: Self-pay | Source: Ambulatory Visit | Attending: Orthopedic Surgery

## 2011-09-19 ENCOUNTER — Encounter (HOSPITAL_COMMUNITY): Payer: Self-pay | Admitting: *Deleted

## 2011-09-19 DIAGNOSIS — IMO0002 Reserved for concepts with insufficient information to code with codable children: Secondary | ICD-10-CM | POA: Insufficient documentation

## 2011-09-19 DIAGNOSIS — R011 Cardiac murmur, unspecified: Secondary | ICD-10-CM | POA: Insufficient documentation

## 2011-09-19 DIAGNOSIS — I2510 Atherosclerotic heart disease of native coronary artery without angina pectoris: Secondary | ICD-10-CM | POA: Insufficient documentation

## 2011-09-19 DIAGNOSIS — S83289A Other tear of lateral meniscus, current injury, unspecified knee, initial encounter: Secondary | ICD-10-CM | POA: Diagnosis present

## 2011-09-19 DIAGNOSIS — K219 Gastro-esophageal reflux disease without esophagitis: Secondary | ICD-10-CM | POA: Insufficient documentation

## 2011-09-19 DIAGNOSIS — X58XXXA Exposure to other specified factors, initial encounter: Secondary | ICD-10-CM | POA: Insufficient documentation

## 2011-09-19 DIAGNOSIS — E7800 Pure hypercholesterolemia, unspecified: Secondary | ICD-10-CM | POA: Insufficient documentation

## 2011-09-19 DIAGNOSIS — Z01812 Encounter for preprocedural laboratory examination: Secondary | ICD-10-CM | POA: Insufficient documentation

## 2011-09-19 DIAGNOSIS — M1710 Unilateral primary osteoarthritis, unspecified knee: Secondary | ICD-10-CM | POA: Insufficient documentation

## 2011-09-19 DIAGNOSIS — M23329 Other meniscus derangements, posterior horn of medial meniscus, unspecified knee: Secondary | ICD-10-CM | POA: Diagnosis present

## 2011-09-19 DIAGNOSIS — I10 Essential (primary) hypertension: Secondary | ICD-10-CM | POA: Insufficient documentation

## 2011-09-19 DIAGNOSIS — M1712 Unilateral primary osteoarthritis, left knee: Secondary | ICD-10-CM | POA: Diagnosis present

## 2011-09-19 HISTORY — PX: KNEE ARTHROSCOPY: SHX127

## 2011-09-19 SURGERY — ARTHROSCOPY, KNEE
Anesthesia: General | Site: Knee | Laterality: Left | Wound class: Clean

## 2011-09-19 MED ORDER — LIDOCAINE HCL (CARDIAC) 20 MG/ML IV SOLN
INTRAVENOUS | Status: DC | PRN
  Administered 2011-09-19: 75 mg via INTRAVENOUS

## 2011-09-19 MED ORDER — FENTANYL CITRATE 0.05 MG/ML IJ SOLN
INTRAMUSCULAR | Status: DC | PRN
  Administered 2011-09-19 (×2): 100 ug via INTRAVENOUS
  Administered 2011-09-19: 50 ug via INTRAVENOUS

## 2011-09-19 MED ORDER — ONDANSETRON HCL 4 MG/2ML IJ SOLN
INTRAMUSCULAR | Status: DC | PRN
  Administered 2011-09-19: 4 mg via INTRAVENOUS

## 2011-09-19 MED ORDER — LACTATED RINGERS IR SOLN
Status: DC | PRN
  Administered 2011-09-19: 3000 mL

## 2011-09-19 MED ORDER — FENTANYL CITRATE 0.05 MG/ML IJ SOLN: 25.0000 ug | INTRAMUSCULAR | Status: DC | PRN

## 2011-09-19 MED ORDER — CEFAZOLIN SODIUM-DEXTROSE 2-3 GM-% IV SOLR
2.0000 g | INTRAVENOUS | Status: AC
  Administered 2011-09-19: 2 g via INTRAVENOUS

## 2011-09-19 MED ORDER — LACTATED RINGERS IV SOLN
INTRAVENOUS | Status: DC
  Administered 2011-09-19: 1000 mL via INTRAVENOUS

## 2011-09-19 MED ORDER — PROMETHAZINE HCL 25 MG/ML IJ SOLN: 6.2500 mg | INTRAMUSCULAR | Status: DC | PRN

## 2011-09-19 MED ORDER — BUPIVACAINE-EPINEPHRINE 0.25% -1:200000 IJ SOLN
INTRAMUSCULAR | Status: DC | PRN
  Administered 2011-09-19: 20 mL

## 2011-09-19 MED ORDER — BACITRACIN ZINC 500 UNIT/GM EX OINT
TOPICAL_OINTMENT | CUTANEOUS | Status: DC | PRN
  Administered 2011-09-19: 1 via TOPICAL

## 2011-09-19 MED ORDER — ACETAMINOPHEN 10 MG/ML IV SOLN
INTRAVENOUS | Status: AC
  Filled 2011-09-19: qty 100

## 2011-09-19 MED ORDER — CEFAZOLIN SODIUM-DEXTROSE 2-3 GM-% IV SOLR
INTRAVENOUS | Status: AC
  Filled 2011-09-19: qty 50

## 2011-09-19 MED ORDER — BUPIVACAINE-EPINEPHRINE PF 0.25-1:200000 % IJ SOLN
INTRAMUSCULAR | Status: AC
  Filled 2011-09-19: qty 30

## 2011-09-19 MED ORDER — BACITRACIN ZINC 500 UNIT/GM EX OINT
TOPICAL_OINTMENT | CUTANEOUS | Status: AC
  Filled 2011-09-19: qty 15

## 2011-09-19 MED ORDER — LACTATED RINGERS IV SOLN: INTRAVENOUS | Status: DC

## 2011-09-19 MED ORDER — OXYCODONE-ACETAMINOPHEN 10-325 MG PO TABS: 1.0000 | ORAL_TABLET | ORAL | Status: AC | PRN

## 2011-09-19 MED ORDER — MEPERIDINE HCL 50 MG/ML IJ SOLN: 6.2500 mg | INTRAMUSCULAR | Status: DC | PRN

## 2011-09-19 MED ORDER — ACETAMINOPHEN 10 MG/ML IV SOLN
INTRAVENOUS | Status: DC | PRN
  Administered 2011-09-19: 1000 mg via INTRAVENOUS

## 2011-09-19 MED ORDER — PROPOFOL 10 MG/ML IV BOLUS
INTRAVENOUS | Status: DC | PRN
  Administered 2011-09-19: 200 mg via INTRAVENOUS

## 2011-09-19 SURGICAL SUPPLY — 26 items
BANDAGE ELASTIC 4 VELCRO ST LF (GAUZE/BANDAGES/DRESSINGS) ×2 IMPLANT
BANDAGE ELASTIC 6 VELCRO ST LF (GAUZE/BANDAGES/DRESSINGS) ×1 IMPLANT
BANDAGE GAUZE ELAST BULKY 4 IN (GAUZE/BANDAGES/DRESSINGS) ×1 IMPLANT
BLADE GREAT WHITE 4.2 (BLADE) ×2 IMPLANT
BNDG COHESIVE 6X5 TAN STRL LF (GAUZE/BANDAGES/DRESSINGS) ×2 IMPLANT
CLOTH BEACON ORANGE TIMEOUT ST (SAFETY) ×2 IMPLANT
DRAPE LG THREE QUARTER DISP (DRAPES) ×2 IMPLANT
DRSG EMULSION OIL 3X3 NADH (GAUZE/BANDAGES/DRESSINGS) ×1 IMPLANT
DRSG PAD ABDOMINAL 8X10 ST (GAUZE/BANDAGES/DRESSINGS) ×5 IMPLANT
DURAPREP 26ML APPLICATOR (WOUND CARE) ×2 IMPLANT
GLOVE BIOGEL PI IND STRL 8 (GLOVE) ×1 IMPLANT
GLOVE BIOGEL PI INDICATOR 8 (GLOVE) ×1
GLOVE ECLIPSE 8.0 STRL XLNG CF (GLOVE) ×2 IMPLANT
GOWN STRL REIN XL XLG (GOWN DISPOSABLE) ×4 IMPLANT
MANIFOLD NEPTUNE II (INSTRUMENTS) ×2 IMPLANT
PACK ARTHROSCOPY WL (CUSTOM PROCEDURE TRAY) ×2 IMPLANT
PACK ICE MAXI GEL EZY WRAP (MISCELLANEOUS) ×2 IMPLANT
PAD MASON LEG HOLDER (PIN) ×2 IMPLANT
SET ARTHROSCOPY TUBING (MISCELLANEOUS) ×2
SET ARTHROSCOPY TUBING LN (MISCELLANEOUS) ×1 IMPLANT
SPONGE GAUZE 4X4 12PLY (GAUZE/BANDAGES/DRESSINGS) ×1 IMPLANT
SUT ETHILON 3 0 PS 1 (SUTURE) ×2 IMPLANT
TOWEL OR 17X26 10 PK STRL BLUE (TOWEL DISPOSABLE) ×6 IMPLANT
TUBING CONNECTING 10 (TUBING) ×2 IMPLANT
WAND 90 DEG TURBOVAC W/CORD (SURGICAL WAND) IMPLANT
WRAP KNEE MAXI GEL POST OP (GAUZE/BANDAGES/DRESSINGS) ×6 IMPLANT

## 2011-09-19 NOTE — Anesthesia Postprocedure Evaluation (Signed)
  Anesthesia Post-op Note  Patient: [name]  Procedure(s) Performed: Procedure(s) (LRB): ARTHROSCOPY KNEE (Left)  Patient Location: PACU  Anesthesia Type: General  Level of Consciousness: awake and alert   Airway and Oxygen Therapy: Patient Spontanous Breathing  Post-op Pain: mild  Post-op Assessment: Post-op Vital signs reviewed, Patient's Cardiovascular Status Stable, Respiratory Function Stable, Patent Airway and No signs of Nausea or vomiting  Post-op Vital Signs: stable  Complications: No apparent anesthesia complications

## 2011-09-19 NOTE — Anesthesia Preprocedure Evaluation (Addendum)
Anesthesia Evaluation  Patient identified by MRN, date of birth, ID band Patient awake    Reviewed: Allergy & Precautions, H&P , NPO status , Patient's Chart, lab work & pertinent test results  Airway Mallampati: II TM Distance: >3 FB Neck ROM: Full    Dental  (+) Teeth Intact and Dental Advisory Given   Pulmonary neg pulmonary ROS, former smoker,    Pulmonary exam normal       Cardiovascular hypertension, Pt. on medications + CAD and + Cardiac Stents negative cardio ROS  + Valvular Problems/Murmurs (moderate) AS Rhythm:Regular Rate:Normal - Systolic murmurs    Neuro/Psych  Headaches, Anxiety TIAnegative neurological ROS  negative psych ROS   GI/Hepatic negative GI ROS, Neg liver ROS, GERD-  Controlled and Medicated,  Endo/Other  negative endocrine ROS  Renal/GU negative Renal ROS  negative genitourinary   Musculoskeletal negative musculoskeletal ROS (+)   Abdominal   Peds negative pediatric ROS (+)  Hematology negative hematology ROS (+)   Anesthesia Other Findings   Reproductive/Obstetrics negative OB ROS                         Anesthesia Physical  Anesthesia Plan  ASA: III  Anesthesia Plan: General   Post-op Pain Management:    Induction: Intravenous  Airway Management Planned: LMA  Additional Equipment:   Intra-op Plan:   Post-operative Plan:   Informed Consent: I have reviewed the patients History and Physical, chart, labs and discussed the procedure including the risks, benefits and alternatives for the proposed anesthesia with the patient or authorized representative who has indicated his/her understanding and acceptance.   Dental advisory given  Plan Discussed with: CRNA, Anesthesiologist and Surgeon  Anesthesia Plan Comments:       Anesthesia Quick Evaluation

## 2011-09-19 NOTE — Transfer of Care (Signed)
Immediate Anesthesia Transfer of Care Note  Patient: [name]  Procedure(s) Performed: Procedure(s) (LRB): ARTHROSCOPY KNEE (Left)  Patient Location: PACU  Anesthesia Type: General  Level of Consciousness: awake, sedated and patient cooperative  Airway & Oxygen Therapy: Patient Spontanous Breathing and Patient connected to face mask oxygen  Post-op Assessment: Report given to PACU RN and Post -op Vital signs reviewed and stable  Post vital signs: Reviewed and stable  Complications: No apparent anesthesia complications

## 2011-09-19 NOTE — Interval H&P Note (Signed)
History and Physical Interval Note:  09/19/2011 1:45 PM  [name]  has presented today for surgery, with the diagnosis of left knee medial mensical tear  The various methods of treatment have been discussed with the patient and family. After consideration of risks, benefits and other options for treatment, the patient has consented to  Procedure(s) (LRB): ARTHROSCOPY KNEE (Left) as a surgical intervention .  The patient's history has been reviewed, patient examined, no change in status, stable for surgery.  I have reviewed the patient's chart and labs.  Questions were answered to the patient's satisfaction.     Vikrant Pryce A

## 2011-09-19 NOTE — Brief Op Note (Signed)
09/19/2011  3:20 PM  PATIENT:  [name]  78 y.o. male  PRE-OPERATIVE DIAGNOSIS:  left knee medial mensical tear and lateral Meniscus Tear and osteoarthritis  POST-OPERATIVE DIAGNOSIS:  left knee medial mensical tear and Lateral Meniscus tear and Osteoarthritis PROCEDURE:  Procedure(s) (LRB): ARTHROSCOPY KNEE (Left)  SURGEON:  Surgeon(s) and Role:    * Jacki Cones, MD - Primary  :   ASSISTANTS: OR Tech  ANESTHESIA:   general  EBL:     BLOOD ADMINISTERED:none  DRAINS: none   LOCAL MEDICATIONS USED:30cc   MARCAINE 0.25% with Epinephrine   SPECIMEN:  No Specimen  DISPOSITION OF SPECIMEN:  N/A  COUNTS:  YES  TOURNIQUET:  * No tourniquets in log *  DICTATION: .Other Dictation: Dictation Number [id number]  PLAN OF CARE: Discharge to home after PACU  PATIENT DISPOSITION:  PACU - hemodynamically stable.   Delay start of Pharmacological VTE agent (>24hrs) due to surgical blood loss or risk of bleeding: yes

## 2011-09-20 NOTE — Op Note (Signed)
NAMEWILLLIAM, [name]               ACCOUNT NO.:  [id number]  MEDICAL RECORD NO.:  [id number]  LOCATION:  [facility]                         FACILITY:  [facility]  PHYSICIAN:  Georges Lynch. Oddie Kuhlmann, M.D.DATE OF BIRTH:  April 23, 1933  DATE OF PROCEDURE:  09/19/2011 DATE OF DISCHARGE:  09/19/2011                              OPERATIVE REPORT   SURGEON:  Windy Fast A. Darrelyn Hillock, MD  ASSISTANT:  Nurse.  PREOPERATIVE DIAGNOSES: 1. Degenerative arthritis of the left knee. 2. Complex tear of the posterior horn, medial meniscus, left knee. 3. Partial tear of the lateral meniscus, left knee.  POSTOPERATIVE DIAGNOSES: 1. Degenerative arthritis of the left knee. 2. Complex tear of the posterior horn, medial meniscus, left knee. 3. Partial tear of the lateral meniscus, left knee.  PROCEDURES: 1. Diagnostic arthroscopy, left knee. 2. Medial meniscectomy, left knee. 3. Lateral meniscectomy, left knee. 4. Abrasion chondroplasty, medial femoral condyle, left knee. 5. Abrasion chondroplasty of patella, left knee.  PROCEDURE:  Under general anesthesia, routine orthopedic prepping and draping the left lower extremity was carried out.  He had 2 g of IV Ancef.  The appropriate time-out was carried out prior to surgery. Also, marked the appropriate left leg in the holding area.  At this particular time, small punctate incision was made in suprapatellar pouch.  Inflow cannula was inserted and knee was distended with saline. Another small punctate incision was made in the anterolateral joint. The arthroscope was inserted.  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  I did a nice synovectomy as well.  I went down in the lateral joint.  He had peripheral tears in lateral meniscus.  I did a partial lateral meniscectomy.  The lateral joint really showed minimal arthritic changes.   Cruciates were intact.  I went over to medial joint. He had severe chondromalacia of the medial femoral condyle.  I introduced the shaver suction device and did an abrasion chondroplasty of the medial femoral condyle.  He had a severe complex tear of the posterior horn and medial meniscus, and at this particular time, I did a medial meniscectomy.  I thoroughly irrigated out the knee, e reinspected the knee.  There were no other abnormalities noted.  So, brief summary I did: 1. Partial lateral meniscectomy. 2. Partial medial meniscectomy. 3. Abrasion chondroplasty of medial femoral condyle. 4. Abrasion chondroplasty of patella. 5. Synovectomy of suprapatellar pouch.  Following surgery, after irrigating the knee, I removed the fluid.  I closed all 3 punctate incisions with 3-0 nylon suture.  I injected 30 mL of 0.25% Marcaine with epinephrine in the joint.  Postop, he had applied an ice bundle dressing, and he will be maintained on aspirin 325 mg b.i.d. today and for 2 weeks b.i.d. He will be on Percocet 10/250 one every 4 hours p.r.n. for pain.  He will be on a walker or crutches partial weightbearing as tolerated, and I will see him in 10-12 days or prior to if there is a problem.          ______________________________ Georges Lynch Darrelyn Hillock, M.D.  RAG/MEDQ  D:  09/19/2011  T:  09/20/2011  Job:  [id number]

## 2011-09-25 ENCOUNTER — Encounter (HOSPITAL_COMMUNITY): Payer: Self-pay | Admitting: Orthopedic Surgery

## 2012-04-30 ENCOUNTER — Other Ambulatory Visit (HOSPITAL_COMMUNITY): Payer: Self-pay | Admitting: Cardiology

## 2012-04-30 DIAGNOSIS — R0602 Shortness of breath: Secondary | ICD-10-CM

## 2012-05-06 ENCOUNTER — Ambulatory Visit (HOSPITAL_COMMUNITY)
Admission: RE | Admit: 2012-05-06 | Discharge: 2012-05-06 | Disposition: A | Discharge status: Home / Self Care | Payer: Medicare Other | Source: Ambulatory Visit | Attending: Cardiology | Admitting: Cardiology

## 2012-05-06 DIAGNOSIS — R0602 Shortness of breath: Secondary | ICD-10-CM | POA: Insufficient documentation

## 2012-06-22 ENCOUNTER — Other Ambulatory Visit: Payer: Self-pay | Admitting: Gastroenterology

## 2012-11-18 ENCOUNTER — Telehealth: Payer: Self-pay | Admitting: *Deleted

## 2012-11-18 NOTE — Telephone Encounter (Signed)
Pt called requesting refill on Crestor to be sent to [place]. He is out, so he is also asking for a local rx to be sent to [facility] [place].

## 2012-11-19 MED ORDER — ROSUVASTATIN CALCIUM 40 MG PO TABS: 20.0000 mg | ORAL_TABLET | Freq: Every day | ORAL | Status: DC

## 2012-11-19 NOTE — Telephone Encounter (Signed)
Sent both rx in for pt.

## 2012-12-06 ENCOUNTER — Other Ambulatory Visit: Payer: Self-pay | Admitting: Cardiology

## 2012-12-06 NOTE — Telephone Encounter (Signed)
Cancelled Aricept RX with [facility]. They will send to Dr. Clelia Croft.

## 2013-02-18 ENCOUNTER — Other Ambulatory Visit: Payer: Self-pay | Admitting: General Surgery

## 2013-02-18 ENCOUNTER — Telehealth: Payer: Self-pay | Admitting: *Deleted

## 2013-02-18 MED ORDER — CARVEDILOL 12.5 MG PO TABS: 12.5000 mg | ORAL_TABLET | Freq: Two times a day (BID) | ORAL | Status: DC

## 2013-02-18 MED ORDER — ROSUVASTATIN CALCIUM 20 MG PO TABS: 20.0000 mg | ORAL_TABLET | Freq: Every day | ORAL | Status: DC

## 2013-02-18 MED ORDER — LOSARTAN POTASSIUM 50 MG PO TABS: 50.0000 mg | ORAL_TABLET | Freq: Every day | ORAL | Status: DC

## 2013-02-18 MED ORDER — POTASSIUM CHLORIDE CRYS ER 20 MEQ PO TBCR: 20.0000 meq | EXTENDED_RELEASE_TABLET | Freq: Every day | ORAL | Status: DC

## 2013-02-18 MED ORDER — CLOPIDOGREL BISULFATE 75 MG PO TABS: 75.0000 mg | ORAL_TABLET | Freq: Every day | ORAL | Status: DC

## 2013-02-18 MED ORDER — FUROSEMIDE 20 MG PO TABS: 20.0000 mg | ORAL_TABLET | Freq: Every day | ORAL | Status: DC

## 2013-02-18 NOTE — Telephone Encounter (Signed)
Patients wife request a 10 day supply of [facility]ilol [facility] [place] and a 90 day to [facility]. She also requests that his other meds be sent to [facility]. Thanks, MI

## 2013-02-18 NOTE — Telephone Encounter (Signed)
Refilled for pt and pt is aware

## 2013-02-18 NOTE — Telephone Encounter (Signed)
Patients wife request a 10 day supply of [facility]ilol [facility] [place] and a 90 day to [facility]. She also requests that his other meds be sent to [facility]. Thanks, MI  

## 2013-02-23 ENCOUNTER — Ambulatory Visit (HOSPITAL_COMMUNITY): Payer: Medicare Other | Attending: Family Medicine

## 2013-02-23 ENCOUNTER — Other Ambulatory Visit (HOSPITAL_COMMUNITY): Payer: Self-pay | Admitting: Cardiology

## 2013-02-23 DIAGNOSIS — I70219 Atherosclerosis of native arteries of extremities with intermittent claudication, unspecified extremity: Secondary | ICD-10-CM | POA: Insufficient documentation

## 2013-02-23 DIAGNOSIS — I10 Essential (primary) hypertension: Secondary | ICD-10-CM | POA: Insufficient documentation

## 2013-02-23 DIAGNOSIS — M79609 Pain in unspecified limb: Secondary | ICD-10-CM

## 2013-02-23 DIAGNOSIS — E785 Hyperlipidemia, unspecified: Secondary | ICD-10-CM | POA: Insufficient documentation

## 2013-02-23 DIAGNOSIS — I739 Peripheral vascular disease, unspecified: Secondary | ICD-10-CM

## 2013-02-25 ENCOUNTER — Telehealth: Payer: Self-pay

## 2013-02-25 ENCOUNTER — Other Ambulatory Visit: Payer: Self-pay

## 2013-02-25 MED ORDER — ROSUVASTATIN CALCIUM 20 MG PO TABS: ORAL_TABLET | ORAL | Status: DC

## 2013-02-25 NOTE — Telephone Encounter (Signed)
Our old system states Crestor 40 mg 1/2 tablet daily.

## 2013-02-28 ENCOUNTER — Telehealth: Payer: Self-pay

## 2013-03-01 ENCOUNTER — Other Ambulatory Visit: Payer: Self-pay

## 2013-03-01 MED ORDER — ROSUVASTATIN CALCIUM 20 MG PO TABS: ORAL_TABLET | ORAL | Status: DC

## 2013-03-01 NOTE — Telephone Encounter (Signed)
Pt should be taking 20 Mg Daily of Crestor

## 2013-03-03 ENCOUNTER — Other Ambulatory Visit: Payer: Self-pay

## 2013-03-03 MED ORDER — ROSUVASTATIN CALCIUM 20 MG PO TABS: ORAL_TABLET | ORAL | Status: DC

## 2013-03-10 ENCOUNTER — Other Ambulatory Visit: Payer: Self-pay

## 2013-03-10 MED ORDER — ROSUVASTATIN CALCIUM 20 MG PO TABS: ORAL_TABLET | ORAL | Status: DC

## 2013-03-13 ENCOUNTER — Other Ambulatory Visit: Payer: Self-pay | Admitting: *Deleted

## 2013-03-13 DIAGNOSIS — E7800 Pure hypercholesterolemia, unspecified: Secondary | ICD-10-CM

## 2013-03-13 DIAGNOSIS — Z79899 Other long term (current) drug therapy: Secondary | ICD-10-CM

## 2013-03-19 ENCOUNTER — Observation Stay (HOSPITAL_COMMUNITY)
Admission: EM | Admit: 2013-03-19 | Discharge: 2013-03-22 | Disposition: A | Discharge status: Home / Self Care | Payer: Medicare HMO | Attending: Internal Medicine | Admitting: Internal Medicine

## 2013-03-19 ENCOUNTER — Encounter (HOSPITAL_COMMUNITY): Payer: Self-pay | Admitting: Emergency Medicine

## 2013-03-19 ENCOUNTER — Emergency Department (HOSPITAL_COMMUNITY): Payer: Medicare HMO

## 2013-03-19 DIAGNOSIS — Z7902 Long term (current) use of antithrombotics/antiplatelets: Secondary | ICD-10-CM | POA: Insufficient documentation

## 2013-03-19 DIAGNOSIS — J3489 Other specified disorders of nose and nasal sinuses: Secondary | ICD-10-CM | POA: Insufficient documentation

## 2013-03-19 DIAGNOSIS — Z8673 Personal history of transient ischemic attack (TIA), and cerebral infarction without residual deficits: Secondary | ICD-10-CM | POA: Insufficient documentation

## 2013-03-19 DIAGNOSIS — R6889 Other general symptoms and signs: Secondary | ICD-10-CM

## 2013-03-19 DIAGNOSIS — G4730 Sleep apnea, unspecified: Secondary | ICD-10-CM | POA: Insufficient documentation

## 2013-03-19 DIAGNOSIS — I10 Essential (primary) hypertension: Secondary | ICD-10-CM | POA: Insufficient documentation

## 2013-03-19 DIAGNOSIS — N400 Enlarged prostate without lower urinary tract symptoms: Secondary | ICD-10-CM | POA: Insufficient documentation

## 2013-03-19 DIAGNOSIS — R269 Unspecified abnormalities of gait and mobility: Secondary | ICD-10-CM | POA: Insufficient documentation

## 2013-03-19 DIAGNOSIS — R062 Wheezing: Secondary | ICD-10-CM | POA: Insufficient documentation

## 2013-03-19 DIAGNOSIS — J111 Influenza due to unidentified influenza virus with other respiratory manifestations: Secondary | ICD-10-CM | POA: Insufficient documentation

## 2013-03-19 DIAGNOSIS — Z9889 Other specified postprocedural states: Secondary | ICD-10-CM | POA: Insufficient documentation

## 2013-03-19 DIAGNOSIS — R52 Pain, unspecified: Principal | ICD-10-CM | POA: Insufficient documentation

## 2013-03-19 DIAGNOSIS — R0602 Shortness of breath: Secondary | ICD-10-CM | POA: Insufficient documentation

## 2013-03-19 DIAGNOSIS — K219 Gastro-esophageal reflux disease without esophagitis: Secondary | ICD-10-CM | POA: Insufficient documentation

## 2013-03-19 DIAGNOSIS — F0390 Unspecified dementia without behavioral disturbance: Secondary | ICD-10-CM | POA: Insufficient documentation

## 2013-03-19 DIAGNOSIS — Z87891 Personal history of nicotine dependence: Secondary | ICD-10-CM | POA: Insufficient documentation

## 2013-03-19 DIAGNOSIS — R079 Chest pain, unspecified: Secondary | ICD-10-CM | POA: Diagnosis present

## 2013-03-19 DIAGNOSIS — R059 Cough, unspecified: Secondary | ICD-10-CM | POA: Insufficient documentation

## 2013-03-19 DIAGNOSIS — F411 Generalized anxiety disorder: Secondary | ICD-10-CM | POA: Insufficient documentation

## 2013-03-19 DIAGNOSIS — R531 Weakness: Secondary | ICD-10-CM

## 2013-03-19 DIAGNOSIS — R05 Cough: Secondary | ICD-10-CM | POA: Insufficient documentation

## 2013-03-19 DIAGNOSIS — J189 Pneumonia, unspecified organism: Secondary | ICD-10-CM

## 2013-03-19 DIAGNOSIS — R011 Cardiac murmur, unspecified: Secondary | ICD-10-CM | POA: Insufficient documentation

## 2013-03-19 DIAGNOSIS — I2510 Atherosclerotic heart disease of native coronary artery without angina pectoris: Secondary | ICD-10-CM | POA: Insufficient documentation

## 2013-03-19 DIAGNOSIS — Z91040 Latex allergy status: Secondary | ICD-10-CM | POA: Insufficient documentation

## 2013-03-19 DIAGNOSIS — Z79899 Other long term (current) drug therapy: Secondary | ICD-10-CM | POA: Insufficient documentation

## 2013-03-19 LAB — CBC
HCT: 44.3 % (ref 39.0–52.0)
HCT: 43.6 % (ref 39.0–52.0)
HEMOGLOBIN: 15.5 g/dL (ref 13.0–17.0)
Hemoglobin: 15.8 g/dL (ref 13.0–17.0)
MCH: 32.4 pg (ref 26.0–34.0)
MCH: 32.3 pg (ref 26.0–34.0)
MCHC: 35.7 g/dL (ref 30.0–36.0)
MCHC: 35.6 g/dL (ref 30.0–36.0)
MCV: 91 fL (ref 78.0–100.0)
MCV: 90.8 fL (ref 78.0–100.0)
PLATELETS: 135 10*3/uL — AB (ref 150–400)
Platelets: 145 10*3/uL — ABNORMAL LOW (ref 150–400)
RBC: 4.87 MIL/uL (ref 4.22–5.81)
RBC: 4.8 MIL/uL (ref 4.22–5.81)
RDW: 13.7 % (ref 11.5–15.5)
RDW: 13.7 % (ref 11.5–15.5)
WBC: 8.8 10*3/uL (ref 4.0–10.5)
WBC: 9.6 10*3/uL (ref 4.0–10.5)

## 2013-03-19 LAB — URINALYSIS, ROUTINE W REFLEX MICROSCOPIC
Bilirubin Urine: NEGATIVE
Glucose, UA: NEGATIVE mg/dL
Hgb urine dipstick: NEGATIVE
Ketones, ur: 15 mg/dL — AB
Nitrite: NEGATIVE
PROTEIN: 30 mg/dL — AB
Specific Gravity, Urine: 1.022 (ref 1.005–1.030)
UROBILINOGEN UA: 1 mg/dL (ref 0.0–1.0)
pH: 7.5 (ref 5.0–8.0)

## 2013-03-19 LAB — CREATININE, SERUM
CREATININE: 0.72 mg/dL (ref 0.50–1.35)
GFR calc Af Amer: >90 mL/min (ref >90)
GFR calc non Af Amer: 88 mL/min — ABNORMAL LOW (ref >90)

## 2013-03-19 LAB — BASIC METABOLIC PANEL
BUN: 10 mg/dL (ref 6–23)
CO2: 21 meq/L (ref 19–32)
Calcium: 8.6 mg/dL (ref 8.4–10.5)
Chloride: 104 mEq/L (ref 96–112)
Creatinine, Ser: 0.73 mg/dL (ref 0.50–1.35)
GFR calc non Af Amer: 87 mL/min — ABNORMAL LOW (ref >90)
Glucose, Bld: 131 mg/dL — ABNORMAL HIGH (ref 70–99)
Potassium: 3.8 mEq/L (ref 3.7–5.3)
SODIUM: 138 meq/L (ref 137–147)

## 2013-03-19 LAB — URINE MICROSCOPIC-ADD ON

## 2013-03-19 LAB — PRO B NATRIURETIC PEPTIDE: PRO B NATRI PEPTIDE: 517.6 pg/mL — AB (ref 0–450)

## 2013-03-19 LAB — INFLUENZA PANEL BY PCR (TYPE A & B)
H1N1 flu by pcr: NOT DETECTED
Influenza A By PCR: NEGATIVE
Influenza B By PCR: POSITIVE — AB

## 2013-03-19 LAB — POCT I-STAT TROPONIN I: Troponin i, poc: 0 ng/mL (ref 0.00–0.08)

## 2013-03-19 LAB — TROPONIN I
Troponin I: <0.3 ng/mL (ref <0.30)
Troponin I: <0.3 ng/mL (ref <0.30)

## 2013-03-19 MED ORDER — ONDANSETRON HCL 4 MG PO TABS: 4.0000 mg | ORAL_TABLET | Freq: Four times a day (QID) | ORAL | Status: DC | PRN

## 2013-03-19 MED ORDER — CARVEDILOL 12.5 MG PO TABS
12.5000 mg | ORAL_TABLET | Freq: Two times a day (BID) | ORAL | Status: DC
  Administered 2013-03-19 – 2013-03-22 (×6): 12.5 mg via ORAL
  Filled 2013-03-19 (×8): qty 1

## 2013-03-19 MED ORDER — HYDROCODONE-ACETAMINOPHEN 5-325 MG PO TABS: 1.0000 | ORAL_TABLET | ORAL | Status: DC | PRN

## 2013-03-19 MED ORDER — OSELTAMIVIR PHOSPHATE 75 MG PO CAPS
75.0000 mg | ORAL_CAPSULE | Freq: Two times a day (BID) | ORAL | Status: DC
  Administered 2013-03-19 – 2013-03-22 (×6): 75 mg via ORAL
  Filled 2013-03-19 (×8): qty 1

## 2013-03-19 MED ORDER — NITROGLYCERIN 0.4 MG SL SUBL: 0.4000 mg | SUBLINGUAL_TABLET | SUBLINGUAL | Status: DC | PRN

## 2013-03-19 MED ORDER — SODIUM CHLORIDE 0.9 % IJ SOLN
3.0000 mL | Freq: Two times a day (BID) | INTRAMUSCULAR | Status: DC
  Administered 2013-03-20 – 2013-03-22 (×3): 3 mL via INTRAVENOUS

## 2013-03-19 MED ORDER — SODIUM CHLORIDE 0.9 % IV SOLN: INTRAVENOUS | Status: AC

## 2013-03-19 MED ORDER — PANTOPRAZOLE SODIUM 40 MG PO TBEC
40.0000 mg | DELAYED_RELEASE_TABLET | Freq: Every day | ORAL | Status: DC
  Administered 2013-03-19 – 2013-03-22 (×4): 40 mg via ORAL
  Filled 2013-03-19 (×4): qty 1

## 2013-03-19 MED ORDER — SODIUM CHLORIDE 0.9 % IV SOLN
INTRAVENOUS | Status: DC
  Administered 2013-03-19: 09:00:00 via INTRAVENOUS

## 2013-03-19 MED ORDER — ATORVASTATIN CALCIUM 40 MG PO TABS
40.0000 mg | ORAL_TABLET | Freq: Every day | ORAL | Status: DC
  Administered 2013-03-19 – 2013-03-21 (×3): 40 mg via ORAL
  Filled 2013-03-19 (×4): qty 1

## 2013-03-19 MED ORDER — POTASSIUM CHLORIDE CRYS ER 20 MEQ PO TBCR
20.0000 meq | EXTENDED_RELEASE_TABLET | Freq: Every day | ORAL | Status: DC
  Administered 2013-03-19 – 2013-03-22 (×4): 20 meq via ORAL
  Filled 2013-03-19 (×4): qty 1

## 2013-03-19 MED ORDER — OSELTAMIVIR PHOSPHATE 75 MG PO CAPS
75.0000 mg | ORAL_CAPSULE | Freq: Once | ORAL | Status: AC
  Administered 2013-03-19: 75 mg via ORAL
  Filled 2013-03-19: qty 1

## 2013-03-19 MED ORDER — DONEPEZIL HCL 10 MG PO TABS
10.0000 mg | ORAL_TABLET | Freq: Every day | ORAL | Status: DC
  Administered 2013-03-19 – 2013-03-21 (×3): 10 mg via ORAL
  Filled 2013-03-19 (×4): qty 1

## 2013-03-19 MED ORDER — ASPIRIN 81 MG PO CHEW
81.0000 mg | CHEWABLE_TABLET | Freq: Every day | ORAL | Status: DC
  Administered 2013-03-19 – 2013-03-21 (×3): 81 mg via ORAL
  Filled 2013-03-19 (×3): qty 1

## 2013-03-19 MED ORDER — FINASTERIDE 5 MG PO TABS
5.0000 mg | ORAL_TABLET | Freq: Every day | ORAL | Status: DC
  Administered 2013-03-19 – 2013-03-22 (×4): 5 mg via ORAL
  Filled 2013-03-19 (×4): qty 1

## 2013-03-19 MED ORDER — ACETAMINOPHEN 325 MG PO TABS: 650.0000 mg | ORAL_TABLET | Freq: Four times a day (QID) | ORAL | Status: DC | PRN

## 2013-03-19 MED ORDER — HYDROCOD POLST-CHLORPHEN POLST 10-8 MG/5ML PO LQCR
5.0000 mL | Freq: Two times a day (BID) | ORAL | Status: DC | PRN
Start: 2013-03-19 — End: 2013-03-22
  Administered 2013-03-19: 5 mL via ORAL
  Filled 2013-03-19: qty 5

## 2013-03-19 MED ORDER — IPRATROPIUM BROMIDE 0.02 % IN SOLN
0.5000 mg | Freq: Once | RESPIRATORY_TRACT | Status: AC
  Administered 2013-03-19: 0.5 mg via RESPIRATORY_TRACT
  Filled 2013-03-19: qty 2.5

## 2013-03-19 MED ORDER — FUROSEMIDE 20 MG PO TABS
20.0000 mg | ORAL_TABLET | Freq: Every day | ORAL | Status: DC
  Administered 2013-03-19: 20 mg via ORAL
  Filled 2013-03-19 (×3): qty 1

## 2013-03-19 MED ORDER — MIRABEGRON ER 50 MG PO TB24
50.0000 mg | ORAL_TABLET | Freq: Every day | ORAL | Status: DC
  Administered 2013-03-19 – 2013-03-22 (×4): 50 mg via ORAL
  Filled 2013-03-19 (×4): qty 1

## 2013-03-19 MED ORDER — DOCUSATE SODIUM 100 MG PO CAPS
100.0000 mg | ORAL_CAPSULE | Freq: Two times a day (BID) | ORAL | Status: DC
  Administered 2013-03-20 – 2013-03-21 (×3): 100 mg via ORAL
  Filled 2013-03-19 (×8): qty 1

## 2013-03-19 MED ORDER — ALBUTEROL SULFATE (2.5 MG/3ML) 0.083% IN NEBU
5.0000 mg | INHALATION_SOLUTION | Freq: Once | RESPIRATORY_TRACT | Status: AC
  Administered 2013-03-19: 5 mg via RESPIRATORY_TRACT
  Filled 2013-03-19: qty 6

## 2013-03-19 MED ORDER — HEPARIN SODIUM (PORCINE) 5000 UNIT/ML IJ SOLN
5000.0000 [IU] | Freq: Three times a day (TID) | INTRAMUSCULAR | Status: DC
  Filled 2013-03-19 (×9): qty 1

## 2013-03-19 MED ORDER — ONDANSETRON HCL 4 MG/2ML IJ SOLN: 4.0000 mg | Freq: Four times a day (QID) | INTRAMUSCULAR | Status: DC | PRN

## 2013-03-19 MED ORDER — ACETAMINOPHEN 650 MG RE SUPP: 650.0000 mg | Freq: Four times a day (QID) | RECTAL | Status: DC | PRN

## 2013-03-19 MED ORDER — ONDANSETRON HCL 4 MG/2ML IJ SOLN
4.0000 mg | Freq: Once | INTRAMUSCULAR | Status: AC
  Administered 2013-03-19: 4 mg via INTRAVENOUS
  Filled 2013-03-19: qty 2

## 2013-03-19 MED ORDER — LOSARTAN POTASSIUM 50 MG PO TABS
50.0000 mg | ORAL_TABLET | Freq: Every day | ORAL | Status: DC
  Administered 2013-03-19 – 2013-03-22 (×4): 50 mg via ORAL
  Filled 2013-03-19 (×4): qty 1

## 2013-03-19 MED ORDER — SERTRALINE HCL 50 MG PO TABS
50.0000 mg | ORAL_TABLET | Freq: Every day | ORAL | Status: DC
  Administered 2013-03-19 – 2013-03-22 (×4): 50 mg via ORAL
  Filled 2013-03-19 (×4): qty 1

## 2013-03-19 MED ORDER — GUAIFENESIN ER 600 MG PO TB12
600.0000 mg | ORAL_TABLET | Freq: Two times a day (BID) | ORAL | Status: DC
  Administered 2013-03-19 – 2013-03-22 (×6): 600 mg via ORAL
  Filled 2013-03-19 (×8): qty 1

## 2013-03-19 MED ORDER — CLOPIDOGREL BISULFATE 75 MG PO TABS
75.0000 mg | ORAL_TABLET | Freq: Every day | ORAL | Status: DC
  Administered 2013-03-19 – 2013-03-22 (×4): 75 mg via ORAL
  Filled 2013-03-19 (×4): qty 1

## 2013-03-19 MED ORDER — ALBUTEROL SULFATE (2.5 MG/3ML) 0.083% IN NEBU
2.5000 mg | INHALATION_SOLUTION | Freq: Four times a day (QID) | RESPIRATORY_TRACT | Status: DC | PRN
  Administered 2013-03-19 – 2013-03-22 (×7): 2.5 mg via RESPIRATORY_TRACT
  Filled 2013-03-19 (×7): qty 3

## 2013-03-19 NOTE — ED Provider Notes (Signed)
Complaint generalized weakness, cough, shortness of breath onset 2 days ago. Patient had chest pain lasting 5-10 minutes at the Center his chest this morning, results of his without treatment. No nausea or vomiting. On exam patient is ill-appearing, HEENT examination is dry neck supple lungs scant diffuse rhonchi heart regular rate and rhythm abdomen nondistended nontender extremities no edema  Orlie Dakin, MD 03/19/13 [phone]

## 2013-03-19 NOTE — ED Notes (Signed)
Pt states he called 911 because he was having cp, but is currently CP free.  Pt took 324 baby asa prior to EMS arrival.  Pt states he has been having weakness for two weeks, and today he has not been able to walk, but is normally ambulatory.

## 2013-03-19 NOTE — ED Provider Notes (Signed)
Medical screening examination/treatment/procedure(s) were conducted as a shared visit with non-physician practitioner(s) and myself.  I personally evaluated the patient during the encounter.     Orlie Dakin, MD 03/19/13 [phone]

## 2013-03-19 NOTE — ED Provider Notes (Signed)
CSN: [id number]     Arrival date & time 03/19/13  0619 History   First MD Initiated Contact with Patient 03/19/13 0631     Chief Complaint  Patient presents with  . Weakness  . Chest Pain   (Consider location/radiation/quality/duration/timing/severity/associated sxs/prior Treatment) Patient is a 80 y.o. male presenting with weakness and chest pain. The history is provided by the patient and medical records.  Weakness Associated symptoms include chest pain, congestion, coughing and weakness.  Chest Pain Associated symptoms: cough, shortness of breath and weakness    This is a 80 year old male with past medical history significant for hypertension, CAD, anxiety, early dementia, presenting to the ED via EMS for chest pain. Patient states earlier this morning around 0530 he began having midsternal chest pain, described as an ache. Patient states pain lasted less than 10 minutes total. States he took 325 of aspirin prior to EMS arrival. On arrival to the ED patient chest pain-free. Wife notes that he has been having and generalized weakness for the past 2 days.  He has had associated cough, congestion, fever, shortness of breath, wheezing. Some recent orthopnea-- pt has been sleeping upright in recliner for the last several days.  Patient denies recent sick contacts with similar symptoms. He did receive a flu shot this year. He notes decreased by mouth intake over the last several days. Some associated nausea but no vomiting or diarrhea.  This morning pt was unable to ambulate-- usually walks with assistive device.  Pt mildly tachypneic on arrival, VS otherwise stable. Cardiologist-- Dr. Radford Pax PCP-- Dr. Brigitte Pulse  Past Medical History  Diagnosis Date  . Hypertension   . Heart murmur   . BPH (benign prostatic hypertrophy)   . GERD (gastroesophageal reflux disease)   . Anxiety   . Headache(784.0)     relieved with OTC's  . Stroke     possible   . Sleep apnea     STOP BANG SCORE 5  . Dementia    early per pt- recently evaluated at [facility]- eccho7/13  , MRI head EPIC 6/13  . Bruises easily   . Coronary artery disease     eccho 7/13,EKG 3/13, chest 3/13 EPIC, BMET 08/21/11 EPIC  . Unsteady gait    Past Surgical History  Procedure Laterality Date  . Transurethral resection of prostate    . Turp vaporization    . Cardiac catheterization      3 stents first cath 2 stents placed then additional stent placed  . Hemorroidectomy    . Rotator cuff repair  3/13    right  . Tonsillectomy    . Knee arthroscopy  09/19/2011    Procedure: ARTHROSCOPY KNEE;  Surgeon: [name], MD;  Location: [facility];  Service: Orthopedics;  Laterality: Left;   Family History  Problem Relation Age of Onset  . Anesthesia problems Neg Hx    History  Substance Use Topics  . Smoking status: Former Smoker    Quit date: 02/11/1991  . Smokeless tobacco: Never Used  . Alcohol Use: 1.8 oz/week    3 Glasses of wine per week     Comment: weekly/    occ beer    Review of Systems  HENT: Positive for congestion and rhinorrhea.   Respiratory: Positive for cough and shortness of breath.   Cardiovascular: Positive for chest pain.  Neurological: Positive for weakness.  All other systems reviewed and are negative.    Allergies  Latex and Sulfa antibiotics  Home Medications  Current Outpatient Rx  Name  Route  Sig  Dispense  Refill  . carvedilol (COREG) 12.5 MG tablet   Oral   Take 1 tablet (12.5 mg total) by mouth 2 (two) times daily with a meal.   180 tablet   3   . clopidogrel (PLAVIX) 75 MG tablet   Oral   Take 1 tablet (75 mg total) by mouth daily.   90 tablet   3   . donepezil (ARICEPT) 10 MG tablet      TAKE 1 TABLET BY MOUTH EVERY DAY   30 tablet   12   . furosemide (LASIX) 20 MG tablet   Oral   Take 1 tablet (20 mg total) by mouth daily after breakfast.   90 tablet   3   . losartan (COZAAR) 50 MG tablet   Oral   Take 1 tablet (50 mg total) by mouth daily.   90  tablet   3   . mirabegron ER (MYRBETRIQ) 25 MG TB24   Oral   Take 50 mg by mouth daily.         . Multiple Vitamin (MULTIVITAMIN WITH MINERALS) TABS   Oral   Take 1 tablet by mouth daily.         . Multiple Vitamins-Minerals (CENTURY CARDIO HEALTH FORMULA PO)   Oral   Take 1 tablet by mouth 2 (two) times daily.         . nitroGLYCERIN (NITROSTAT) 0.4 MG SL tablet   Sublingual   Place 0.4 mg under the tongue every 5 (five) minutes as needed. For chest pain         . omeprazole (PRILOSEC) 40 MG capsule   Oral   Take 40 mg by mouth daily.         . potassium chloride SA (K-DUR,KLOR-CON) 20 MEQ tablet   Oral   Take 1 tablet (20 mEq total) by mouth daily.   90 tablet   3   . rosuvastatin (CRESTOR) 20 MG tablet      Take  1/2 tablet by mouth  daily   90 tablet   3   . sertraline (ZOLOFT) 50 MG tablet   Oral   Take 50 mg by mouth daily.           BP 160/84  Pulse 82  Temp(Src) 98.8 F (37.1 C) (Oral)  Resp 29  SpO2 93%  Physical Exam  Nursing note and vitals reviewed. Constitutional: He is oriented to person, place, and time. He appears well-developed and well-nourished. No distress.  Appears somewhat pale, weak  HENT:  Head: Normocephalic and atraumatic.  Right Ear: Tympanic membrane and ear canal normal.  Left Ear: Tympanic membrane and ear canal normal.  Nose: Mucosal edema present.  Mouth/Throat: Uvula is midline, oropharynx is clear and moist and mucous membranes are normal. No oropharyngeal exudate, posterior oropharyngeal edema, posterior oropharyngeal erythema or tonsillar abscesses.  Turbinates swollen and erythematous, PND noted in oropharynx  Eyes: Conjunctivae and EOM are normal. Pupils are equal, round, and reactive to light.  Neck: Normal range of motion. Neck supple.  Cardiovascular: Normal rate, regular rhythm and normal heart sounds.   Pulmonary/Chest: Effort normal. No respiratory distress. He has wheezes. He has rhonchi.  End  expiratory wheezes at bilateral bases, slight rhonchi at right base  Abdominal: Soft. Bowel sounds are normal. There is no tenderness. There is no guarding.  Musculoskeletal: Normal range of motion.  Neurological: He is alert and oriented to person, place, and  time. He has normal strength. He displays no tremor. No cranial nerve deficit or sensory deficit. He displays no seizure activity.  AAOx3, answering questions appropriately; equal strength UE and LE bilaterally; CN grossly intact; moves all extremities appropriately without ataxia; no focal neuro deficits or facial asymmetry appreciated  Skin: Skin is warm and dry. He is not diaphoretic.  Psychiatric: He has a normal mood and affect.    ED Course  Procedures (including critical care time) Labs Review Labs Reviewed  CBC - Abnormal; Notable for the following:    Platelets 135 (*)    All other components within normal limits  BASIC METABOLIC PANEL - Abnormal; Notable for the following:    Glucose, Bld 131 (*)    GFR calc non Af Amer 87 (*)    All other components within normal limits  PRO B NATRIURETIC PEPTIDE - Abnormal; Notable for the following:    Pro B Natriuretic peptide (BNP) 517.6 (*)    All other components within normal limits  URINALYSIS, ROUTINE W REFLEX MICROSCOPIC - Abnormal; Notable for the following:    Ketones, ur 15 (*)    Protein, ur 30 (*)    Leukocytes, UA TRACE (*)    All other components within normal limits  INFLUENZA PANEL BY PCR (TYPE A & B, H1N1) - Abnormal; Notable for the following:    Influenza B By PCR POSITIVE (*)    All other components within normal limits  URINE MICROSCOPIC-ADD ON  CBC  CREATININE, SERUM  TROPONIN I  TROPONIN I  TROPONIN I  POCT I-STAT TROPONIN I   Imaging Review Dg Chest 2 View  03/19/2013   CLINICAL DATA:  Chest pain  EXAM: CHEST  2 VIEW  COMPARISON:  CT ABD-PEL WO/W CM dated 02/08/2013; DG CHEST 2 VIEW dated 05/06/2011  FINDINGS: Low lung volumes. Elevated right  hemidiaphragm. No focal regions of consolidation or focal infiltrates. Calcified granuloma projects along the superior lateral apex of the left upper lobe. The osseous structures unremarkable.  IMPRESSION: No active cardiopulmonary disease.   Electronically Signed   By: Margaree Mackintosh M.D.   On: 03/19/2013 07:57    EKG Interpretation   None       MDM   1. Flu-like symptoms   2. Generalized weakness   3. Shortness of breath    EKG NSR with PVC's, no acute ischemic changes.  Trop negative.  CXR clear.  Labs reassuring.  Pt continues to remain CP free, however still appears ill and generally weak.  Will admit to hospitalist for observation.  Discussed with Dr. Daryll Drown-- will evaluate pt in the ED and admit.  Larene Pickett, PA-C 03/19/13 1215

## 2013-03-19 NOTE — H&P (Signed)
[facility] History and Physical  [name] [id number] DOB: [birth year] DOA: 03/19/2013  Referring physician: ED physician PCP: [name], MD   Chief Complaint: Chest pain, cough, congestion  HPI:  [name] is a 80yo man with PMH of HTN, GERD, h/o CVA, CAD, Dementia, GERD who presented due to an episode of chest pain.  During my evaluation, [name] was somewhat sleepy and some of the history was obtained from family (wife in the room) and ED provider.  Per this history, [name] developed midsternal chest pain, early in the morning, which he described as an ache.  He took an aspirin and the pain lasted about 10 minutes.  When he arrived at the ED, he was chest pain free.  Per his family, he wanted to leave the ED at that time.  However, his wife reported that he was also suffering from 2 days of "not being himself."  She notes that he has had subjective fevers, generalized weakness and associated cough, congestion, SOB, wheezing.  He has had to sit in the recliner at night to sleep.  When asked if he had pains or a headache he stated "I ache all over" but he specifically denied neck pain.  Further symptoms include decreased PO intake including fluids, nausea and inability to walk this morning.  His wife also notes that his demeanor is very different than his usual.    Assessment and Plan:  Chest pain, somewhat typical with h/o CAD - Patient not completely clear with history when I saw him, but the symptoms were typically, cleared up with aspirin - CE X 3 for ACS rule out - EKG once on floor - AM EKG - Telemetry - NTG for pain control - may be related to acute illness - Continue home plavix, aspirin.   Influenza with respiratory manifestations - Influenza panel + for flu B - Tamiflu started - Fluids as needed, currently on 125/hr; he has a history of grade 2 diastolic CHF, so will decrease these to 100cc/hr and place a end time.  Can reassess need in AM.  - Allow  plenty of PO intake - Supportive care - O2 if needed to maintain O2 sats > 90% - He does not have an elevated WBC and renal function is stable - Monitor closely - I have a low suspicion that he has meningitis ( tmax 100.3 here, no elevated WBC, no meningeal signs) but would have a low threshold to do LP if he does not improve or acutely worsens.   HTN - BP stable, mildly elevated - Continue home medications including, coreg, lasix, losartan  BPH - Continuing home finasteride b/c BP is stable at the moment, if develops dizziness, orthostasis would stop.   DVT PPx: Heparin SQ   Code Status: Full Family Communication: Pt and family at bedside Disposition Plan: Pending clinical improvement 2-3 days anticipated LOS.    Review of Systems:  Constitutional: Positive for fever, malaise/fatigue. Negative for chills and diaphoresis.  HENT: Negative for hearing loss, ear pain, nosebleeds, sore throat, neck pain, tinnitus and ear discharge.  Positive for congestion. Eyes: Negative for blurred vision, double vision Respiratory: Positive for cough, shortness of breath, wheezing.  Negative for hemoptysis, sputum production , and stridor.   Cardiovascular: Positive for chest pain, orthopnea.  Negative for palpitations, claudication and leg swelling.  Gastrointestinal: Positive for nausea, Negative for vomiting and abdominal pain, heartburn, constipation, blood in stool and melena.  Genitourinary: Negative for dysuria, urgency, frequency Musculoskeletal: Positive for myalgias.  Negative for back pain, joint pain and falls.  Skin: Negative for itching and rash.  Neurological: Positive for weakness, ? Positive for headache. Negative for tingling, tremors, sensory change, speech change, focal weakness, loss of consciousness.   Past Medical History  Diagnosis Date  . Hypertension   . Heart murmur   . BPH (benign prostatic hypertrophy)   . GERD (gastroesophageal reflux disease)   . Anxiety   .  Headache(784.0)     relieved with OTC's  . Stroke     possible   . Sleep apnea     STOP BANG SCORE 5  . Dementia     early per pt- recently evaluated at [facility]- eccho7/13  , MRI head EPIC 6/13  . Bruises easily   . Coronary artery disease     eccho 7/13,EKG 3/13, chest 3/13 EPIC, BMET 08/21/11 EPIC  . Unsteady gait     Past Surgical History  Procedure Laterality Date  . Transurethral resection of prostate    . Turp vaporization    . Cardiac catheterization      3 stents first cath 2 stents placed then additional stent placed  . Hemorroidectomy    . Rotator cuff repair  3/13    right  . Tonsillectomy    . Knee arthroscopy  09/19/2011    Procedure: ARTHROSCOPY KNEE;  Surgeon: [name], MD;  Location: [facility];  Service: Orthopedics;  Laterality: Left;    Social History: former smoker, occasional ETOH, no drugs  Allergies  Allergen Reactions  . Latex Rash  . Sulfa Antibiotics Hives and Rash    Family History  Problem Relation Age of Onset  . Anesthesia problems Neg Hx     Prior to Admission medications   Medication Sig Start Date End Date Taking? Authorizing Provider  aspirin 81 MG chewable tablet Chew 81 mg by mouth daily.   Yes Historical Provider, MD  carvedilol (COREG) 12.5 MG tablet Take 1 tablet (12.5 mg total) by mouth 2 (two) times daily with a meal. 02/18/13  Yes Sueanne Margarita, MD  clopidogrel (PLAVIX) 75 MG tablet Take 1 tablet (75 mg total) by mouth daily. 02/18/13  Yes Sueanne Margarita, MD  diclofenac (VOLTAREN) 75 MG EC tablet Take 1 tablet by mouth 2 (two) times daily. 03/17/13  Yes Historical Provider, MD  donepezil (ARICEPT) 10 MG tablet TAKE 1 TABLET BY MOUTH EVERY DAY 12/06/12  Yes Sueanne Margarita, MD  finasteride (PROSCAR) 5 MG tablet Take 5 mg by mouth daily.   Yes Historical Provider, MD  furosemide (LASIX) 20 MG tablet Take 1 tablet (20 mg total) by mouth daily after breakfast. 02/18/13  Yes Sueanne Margarita, MD  losartan (COZAAR) 50 MG tablet Take  1 tablet (50 mg total) by mouth daily. 02/18/13  Yes Sueanne Margarita, MD  mirabegron ER (MYRBETRIQ) 25 MG TB24 Take 50 mg by mouth daily.   Yes Historical Provider, MD  Multiple Vitamin (MULTIVITAMIN WITH MINERALS) TABS Take 1 tablet by mouth daily.   Yes Historical Provider, MD  Multiple Vitamins-Minerals (CENTURY CARDIO HEALTH FORMULA PO) Take 1 tablet by mouth 2 (two) times daily.   Yes Historical Provider, MD  omeprazole (PRILOSEC) 40 MG capsule Take 40 mg by mouth daily.   Yes Historical Provider, MD  potassium chloride SA (K-DUR,KLOR-CON) 20 MEQ tablet Take 1 tablet (20 mEq total) by mouth daily. 02/18/13  Yes Sueanne Margarita, MD  rosuvastatin (CRESTOR) 20 MG tablet Take  1/2 tablet by mouth  daily 03/10/13  Yes Casandra Doffing, MD  sertraline (ZOLOFT) 50 MG tablet Take 50 mg by mouth daily.    Yes Historical Provider, MD  aspirin 81 MG chewable tablet Chew 324 mg by mouth once.    Historical Provider, MD  nitroGLYCERIN (NITROSTAT) 0.4 MG SL tablet Place 0.4 mg under the tongue every 5 (five) minutes as needed. For chest pain    Historical Provider, MD    Physical Exam: Filed Vitals:   03/19/13 0930 03/19/13 1030 03/19/13 1100 03/19/13 1315  BP: 154/78 152/76 156/80 141/85  Pulse: 96 97 96 78  Temp:    99.2 F (37.3 C)  TempSrc:    Oral  Resp: 25 29 28 21   SpO2: 94% 92% 95% 96%    Physical Exam  Constitutional: Tachypneic, somewhat somnolent, alternating between asking to be left alone and answering questions.  HENT: Normocephalic.  Oropharynx is clear and moist. No meningismus Eyes: Conjunctivae and EOM are normal. PERRLA, no scleral icterus.  Neck: Normal ROM. Neck supple. No JVD that I could appreciate.  No meningismus  CVS: RR, NR, S1/S2 +, no murmurs noted Pulmonary: Somewhat breathing quickly but breath sounds normal, no stridor, rhonchi, wheezes, rales.  Abdominal: Soft. BS +,  no distension, tenderness Musculoskeletal: Normal range of motion, some arthritic changes to joints of  hands and toes. Trace edema and no tenderness.  Lymphadenopathy: No lymphadenopathy noted, cervical Neuro: Somewhat sleepy. Normal muscle tone coordination. No cranial nerve deficit. Non focal, moving all extremities Skin: Skin is warm and dry. No rash noted   Labs on Admission:  Basic Metabolic Panel:  Recent Labs Lab 03/19/13 0644  NA 138  K 3.8  CL 104  CO2 21  GLUCOSE 131*  BUN 10  CREATININE 0.73  CALCIUM 8.6    CBC:  Recent Labs Lab 03/19/13 0644 03/19/13 1345  WBC 8.8 9.6  HGB 15.8 15.5  HCT 44.3 43.6  MCV 91.0 90.8  PLT 135* 145*   Cardiac Enzymes: No results found for this basename: CKTOTAL, CKMB, CKMBINDEX, TROPONINI,  in the last 168 hours BNP: No components found with this basename: POCBNP,  CBG: No results found for this basename: GLUCAP,  in the last 168 hours  Radiological Exams on Admission: Dg Chest 2 View  03/19/2013   CLINICAL DATA:  Chest pain  EXAM: CHEST  2 VIEW  COMPARISON:  CT ABD-PEL WO/W CM dated 02/08/2013; DG CHEST 2 VIEW dated 05/06/2011  FINDINGS: Low lung volumes. Elevated right hemidiaphragm. No focal regions of consolidation or focal infiltrates. Calcified granuloma projects along the superior lateral apex of the left upper lobe. The osseous structures unremarkable.  IMPRESSION: No active cardiopulmonary disease.   Electronically Signed   By: Margaree Mackintosh M.D.   On: 03/19/2013 07:57    EKG: No EKG done, ordered for the floor.   Gilles Chiquito, MD  [facility] Pager [phone]  If 7PM-7AM, please contact night-coverage [email or web address] Password [email or web address] 03/19/2013, 2:13 PM

## 2013-03-19 NOTE — ED Provider Notes (Signed)
Date: 03/19/2013  Rate: 75  Rhythm: normal sinus rhythm  QRS Axis: normal  Intervals: normal  ST/T Wave abnormalities: nonspecific T wave changes  Conduction Disutrbances:none  Narrative Interpretation:   Old EKG Reviewed: PVC new from previous tracing otherwise no significant change as compared to 05/08/2011 interpreted by me   Orlie Dakin, MD 03/19/13 [phone]

## 2013-03-20 ENCOUNTER — Observation Stay (HOSPITAL_COMMUNITY): Payer: Medicare HMO

## 2013-03-20 DIAGNOSIS — R5383 Other fatigue: Secondary | ICD-10-CM

## 2013-03-20 DIAGNOSIS — J111 Influenza due to unidentified influenza virus with other respiratory manifestations: Secondary | ICD-10-CM

## 2013-03-20 DIAGNOSIS — R5381 Other malaise: Secondary | ICD-10-CM

## 2013-03-20 DIAGNOSIS — R0602 Shortness of breath: Secondary | ICD-10-CM

## 2013-03-20 LAB — BASIC METABOLIC PANEL
BUN: 12 mg/dL (ref 6–23)
CHLORIDE: 102 meq/L (ref 96–112)
CO2: 20 meq/L (ref 19–32)
Calcium: 8.5 mg/dL (ref 8.4–10.5)
Creatinine, Ser: 0.72 mg/dL (ref 0.50–1.35)
GFR calc non Af Amer: 88 mL/min — ABNORMAL LOW (ref >90)
GLUCOSE: 127 mg/dL — AB (ref 70–99)
POTASSIUM: 3.6 meq/L — AB (ref 3.7–5.3)
SODIUM: 136 meq/L — AB (ref 137–147)

## 2013-03-20 LAB — CBC
HCT: 42.5 % (ref 39.0–52.0)
Hemoglobin: 14.7 g/dL (ref 13.0–17.0)
MCH: 31.5 pg (ref 26.0–34.0)
MCHC: 34.6 g/dL (ref 30.0–36.0)
MCV: 91.2 fL (ref 78.0–100.0)
PLATELETS: 141 10*3/uL — AB (ref 150–400)
RBC: 4.66 MIL/uL (ref 4.22–5.81)
RDW: 14 % (ref 11.5–15.5)
WBC: 8.6 10*3/uL (ref 4.0–10.5)

## 2013-03-20 LAB — EXPECTORATED SPUTUM ASSESSMENT W REFEX TO RESP CULTURE

## 2013-03-20 LAB — TROPONIN I

## 2013-03-20 LAB — EXPECTORATED SPUTUM ASSESSMENT W GRAM STAIN, RFLX TO RESP C

## 2013-03-20 MED ORDER — POLYETHYLENE GLYCOL 3350 17 G PO PACK
17.0000 g | PACK | Freq: Two times a day (BID) | ORAL | Status: DC
  Administered 2013-03-20: 17 g via ORAL
  Filled 2013-03-20 (×6): qty 1

## 2013-03-20 MED ORDER — POTASSIUM CHLORIDE CRYS ER 20 MEQ PO TBCR
40.0000 meq | EXTENDED_RELEASE_TABLET | Freq: Once | ORAL | Status: AC
  Administered 2013-03-20: 40 meq via ORAL

## 2013-03-20 MED ORDER — DEXTROSE 5 % IV SOLN
500.0000 mg | INTRAVENOUS | Status: DC
  Administered 2013-03-20 – 2013-03-22 (×3): 500 mg via INTRAVENOUS
  Filled 2013-03-20 (×3): qty 500

## 2013-03-20 MED ORDER — FUROSEMIDE 10 MG/ML IJ SOLN
20.0000 mg | Freq: Every day | INTRAMUSCULAR | Status: DC
  Administered 2013-03-20 – 2013-03-22 (×3): 20 mg via INTRAVENOUS
  Filled 2013-03-20 (×2): qty 2

## 2013-03-20 MED ORDER — DEXTROSE 5 % IV SOLN
1.0000 g | INTRAVENOUS | Status: DC
  Administered 2013-03-20 – 2013-03-22 (×3): 1 g via INTRAVENOUS
  Filled 2013-03-20 (×3): qty 10

## 2013-03-20 MED ORDER — FUROSEMIDE 10 MG/ML IJ SOLN
INTRAMUSCULAR | Status: AC
  Filled 2013-03-20: qty 4

## 2013-03-20 NOTE — Progress Notes (Signed)
[facility] PROGRESS NOTE  DORSE LOCY MWU:[id number] DOB: 16-Jul-1933 DOA: 03/19/2013 PCP: [name], MD  Assessment/Plan: Influenza with respiratory manifestations  - Influenza panel + for flu B  - Continue with Tamiflu day 2.  - Supportive care  -Patient alert, now afebrile. MS improved.  -Patient with productive cough, yellow sputum. Will cover for PNA.   Acute Diastolic Heart Failure: crackles on lung exam, Positive JVD. Start IV lasix.   Chest pain, somewhat typical with h/o CAD  - CE X 3 for ACS rule out  - NTG for pain control  - may be related to acute illness  - Continue home plavix, aspirin.   Hypokalemia; replete.  Constipation, abdominal distension; start Miralax. KUB negative.   HTN  - BP stable, mildly elevated  - Continue home medications including, coreg, lasix, losartan.  BPH  - Continuing home finasteride.  DVT PPx: Heparin SQ   Code Status: Full Code.  Family Communication: care discussed with Wife who was at bedside.  Disposition Plan: Remain in the hospital.    Consultants:  None  Procedures:  none  Antibiotics:  Ceftriaxone 2-8  Azithromycin 2-8  Others: Tamiflu.   HPI/Subjective: Feeling weak. Coughing yellowish phlegm. No chest pain. No worsening SOB> no BM for last few days.   Objective: Filed Vitals:   03/20/13 0400  BP: 155/90  Pulse: 77  Temp: 98.2 F (36.8 C)  Resp: 16    Intake/Output Summary (Last 24 hours) at 03/20/13 0945 Last data filed at 03/20/13 0900  Gross per 24 hour  Intake    480 ml  Output    300 ml  Net    180 ml   There were no vitals filed for this visit.  Exam:   General:  No distress. Sick appearing.   Cardiovascular: S 1, S 2 RRR  Respiratory: Crackles bases, bilateral ronchus.   Abdomen: Bs present, soft, NT, mild distended.   Musculoskeletal: trace edema.   Data Reviewed: Basic Metabolic Panel:  Recent Labs Lab 03/19/13 0644 03/19/13 1345 03/20/13 0500  NA  138  --  136*  K 3.8  --  3.6*  CL 104  --  102  CO2 21  --  20  GLUCOSE 131*  --  127*  BUN 10  --  12  CREATININE 0.73 0.72 0.72  CALCIUM 8.6  --  8.5   Liver Function Tests: No results found for this basename: AST, ALT, ALKPHOS, BILITOT, PROT, ALBUMIN,  in the last 168 hours No results found for this basename: LIPASE, AMYLASE,  in the last 168 hours No results found for this basename: AMMONIA,  in the last 168 hours CBC:  Recent Labs Lab 03/19/13 0644 03/19/13 1345 03/20/13 0500  WBC 8.8 9.6 8.6  HGB 15.8 15.5 14.7  HCT 44.3 43.6 42.5  MCV 91.0 90.8 91.2  PLT 135* 145* 141*   Cardiac Enzymes:  Recent Labs Lab 03/19/13 1345 03/19/13 1818 03/19/13 2357  TROPONINI <0.30 <0.30 <0.30   BNP (last 3 results)  Recent Labs  03/19/13 0644  PROBNP 517.6*   CBG: No results found for this basename: GLUCAP,  in the last 168 hours  No results found for this or any previous visit (from the past 240 hour(s)).   Studies: Dg Chest 2 View  03/19/2013   CLINICAL DATA:  Chest pain  EXAM: CHEST  2 VIEW  COMPARISON:  CT ABD-PEL WO/W CM dated 02/08/2013; DG CHEST 2 VIEW dated 05/06/2011  FINDINGS: Low lung volumes. Elevated  right hemidiaphragm. No focal regions of consolidation or focal infiltrates. Calcified granuloma projects along the superior lateral apex of the left upper lobe. The osseous structures unremarkable.  IMPRESSION: No active cardiopulmonary disease.   Electronically Signed   By: Margaree Mackintosh M.D.   On: 03/19/2013 07:57    Scheduled Meds: . aspirin  81 mg Oral Daily  . atorvastatin  40 mg Oral q1800  . azithromycin  500 mg Intravenous Q24H  . carvedilol  12.5 mg Oral BID WC  . cefTRIAXone (ROCEPHIN)  IV  1 g Intravenous Q24H  . clopidogrel  75 mg Oral Daily  . docusate sodium  100 mg Oral BID  . donepezil  10 mg Oral QHS  . finasteride  5 mg Oral Daily  . furosemide  20 mg Intravenous Daily  . guaiFENesin  600 mg Oral BID  . heparin  5,000 Units Subcutaneous  Q8H  . losartan  50 mg Oral Daily  . mirabegron ER  50 mg Oral Daily  . oseltamivir  75 mg Oral BID  . pantoprazole  40 mg Oral Daily  . potassium chloride SA  20 mEq Oral Daily  . potassium chloride  40 mEq Oral Once  . sertraline  50 mg Oral Daily  . sodium chloride  3 mL Intravenous Q12H   Continuous Infusions:   Active Problems:   Chest pain   Influenza with respiratory manifestations    Time spent: 35 minutes.     Tiffony Kite  [facility] Pager [phone]. If 7PM-7AM, please contact night-coverage at [email or web address], password [facility] 03/20/2013, 9:45 AM  LOS: 1 day

## 2013-03-20 NOTE — Progress Notes (Signed)
Utilization Review Completed.Donne Anon T2/09/2013

## 2013-03-21 DIAGNOSIS — R079 Chest pain, unspecified: Secondary | ICD-10-CM

## 2013-03-21 DIAGNOSIS — J189 Pneumonia, unspecified organism: Secondary | ICD-10-CM

## 2013-03-21 LAB — BASIC METABOLIC PANEL
BUN: 12 mg/dL (ref 6–23)
CALCIUM: 8.5 mg/dL (ref 8.4–10.5)
CHLORIDE: 104 meq/L (ref 96–112)
CO2: 24 meq/L (ref 19–32)
Creatinine, Ser: 0.79 mg/dL (ref 0.50–1.35)
GFR calc Af Amer: >90 mL/min (ref >90)
GFR calc non Af Amer: 84 mL/min — ABNORMAL LOW (ref >90)
Glucose, Bld: 142 mg/dL — ABNORMAL HIGH (ref 70–99)
POTASSIUM: 3.7 meq/L (ref 3.7–5.3)
Sodium: 139 mEq/L (ref 137–147)

## 2013-03-21 LAB — CBC
HEMATOCRIT: 44 % (ref 39.0–52.0)
Hemoglobin: 15.5 g/dL (ref 13.0–17.0)
MCH: 32 pg (ref 26.0–34.0)
MCHC: 35.2 g/dL (ref 30.0–36.0)
MCV: 90.9 fL (ref 78.0–100.0)
PLATELETS: 178 10*3/uL (ref 150–400)
RBC: 4.84 MIL/uL (ref 4.22–5.81)
RDW: 13.8 % (ref 11.5–15.5)
WBC: 3.8 10*3/uL — AB (ref 4.0–10.5)

## 2013-03-21 NOTE — Progress Notes (Signed)
[facility] PROGRESS NOTE  [name] WCH:[id number] DOB: 1933-07-23 DOA: 03/19/2013 PCP: [name], MD  Assessment/Plan: Influenza with respiratory manifestations //PNA - Influenza panel + for flu B  - Continue with Tamiflu day 3.  - Supportive care  -Patient alert, now afebrile. MS improved.  -Patient with productive cough, yellow sputum. Will cover for PNA.  -Continue with ceftriaxone and Azithromycin day 2.  -Follow up sputum culture.   Hemoptysis; very small amount. Will hold aspirin. If worse tomorrow will hold plavxi.   Acute Diastolic Heart Failure: crackles on lung exam, Positive JVD. Continue with IV lasix 20 mg daily.   Chest pain, somewhat typical with h/o CAD  - CE X 3 negative - NTG for pain control  - may be related to acute illness  - Continue home plavix, aspirin.   Hypokalemia; replete.  Constipation, abdominal distension; Continue with  Miralax. KUB negative.   HTN  - BP stable.  - Continue home medications including, coreg, lasix, losartan.  BPH  - Continuing home finasteride.  DVT PPx: Heparin SQ   Code Status: Full Code.  Family Communication: care discussed with Wife who was at bedside.  Disposition Plan: Remain in the hospital.    Consultants:  None  Procedures:  none  Antibiotics:  Ceftriaxone 2-8  Azithromycin 2-8  Others: Tamiflu.   HPI/Subjective: Feeling better. Cough improved. Dyspnea better. Chest pain has resolved. Coughing small amount of blood.   Objective: Filed Vitals:   03/21/13 1152  BP: 153/91  Pulse: 59  Temp: 98 F (36.7 C)  Resp: 18    Intake/Output Summary (Last 24 hours) at 03/21/13 1542 Last data filed at 03/21/13 0300  Gross per 24 hour  Intake    360 ml  Output   1150 ml  Net   -790 ml   Filed Weights   03/20/13 0900  Weight: 108.863 kg (240 lb)    Exam:   General:  No distress. Sick appearing.   Cardiovascular: S 1, S 2 RRR  Respiratory: Crackles bases, bilateral  ronchus.   Abdomen: Bs present, soft, NT, mild distended.   Musculoskeletal: trace edema.   Data Reviewed: Basic Metabolic Panel:  Recent Labs Lab 03/19/13 0644 03/19/13 1345 03/20/13 0500 03/21/13 0935  NA 138  --  136* 139  K 3.8  --  3.6* 3.7  CL 104  --  102 104  CO2 21  --  20 24  GLUCOSE 131*  --  127* 142*  BUN 10  --  12 12  CREATININE 0.73 0.72 0.72 0.79  CALCIUM 8.6  --  8.5 8.5   Liver Function Tests: No results found for this basename: AST, ALT, ALKPHOS, BILITOT, PROT, ALBUMIN,  in the last 168 hours No results found for this basename: LIPASE, AMYLASE,  in the last 168 hours No results found for this basename: AMMONIA,  in the last 168 hours CBC:  Recent Labs Lab 03/19/13 0644 03/19/13 1345 03/20/13 0500 03/21/13 1244  WBC 8.8 9.6 8.6 3.8*  HGB 15.8 15.5 14.7 15.5  HCT 44.3 43.6 42.5 44.0  MCV 91.0 90.8 91.2 90.9  PLT 135* 145* 141* 178   Cardiac Enzymes:  Recent Labs Lab 03/19/13 1345 03/19/13 1818 03/19/13 2357  TROPONINI <0.30 <0.30 <0.30   BNP (last 3 results)  Recent Labs  03/19/13 0644  PROBNP 517.6*   CBG: No results found for this basename: GLUCAP,  in the last 168 hours  Recent Results (from the past 240 hour(s))  CULTURE, EXPECTORATED  SPUTUM-ASSESSMENT     Status: None   Collection Time    03/20/13 10:35 AM      Result Value Range Status   Specimen Description SPUTUM   Final   Special Requests NONE   Final   Sputum evaluation     Final   Value: THIS SPECIMEN IS ACCEPTABLE. RESPIRATORY CULTURE REPORT TO FOLLOW.   Report Status 03/20/2013 FINAL   Final  CULTURE, RESPIRATORY (NON-EXPECTORATED)     Status: None   Collection Time    03/20/13 10:35 AM      Result Value Range Status   Specimen Description SPUTUM   Final   Special Requests NONE   Final   Gram Stain     Final   Value: ABUNDANT WBC PRESENT, PREDOMINANTLY PMN     RARE SQUAMOUS EPITHELIAL CELLS PRESENT     FEW GRAM POSITIVE COCCI IN PAIRS     FEW GRAM NEGATIVE  RODS     FEW GRAM POSITIVE RODS   Culture     Final   Value: Culture reincubated for better growth     Performed at [facility]   Report Status PENDING   Incomplete     Studies: Dg Abd 1 View  03/20/2013   CLINICAL DATA:  Abdominal pain  EXAM: ABDOMEN - 1 VIEW  COMPARISON:  None.  FINDINGS: Scattered large and small bowel gas is noted. No obstructive changes are seen. No focal mass lesion or abnormal calcifications are noted.  IMPRESSION: No acute abnormality noted.   Electronically Signed   By: Inez Catalina M.D.   On: 03/20/2013 13:11    Scheduled Meds: . aspirin  81 mg Oral Daily  . atorvastatin  40 mg Oral q1800  . azithromycin  500 mg Intravenous Q24H  . carvedilol  12.5 mg Oral BID WC  . cefTRIAXone (ROCEPHIN)  IV  1 g Intravenous Q24H  . clopidogrel  75 mg Oral Daily  . docusate sodium  100 mg Oral BID  . donepezil  10 mg Oral QHS  . finasteride  5 mg Oral Daily  . furosemide  20 mg Intravenous Daily  . guaiFENesin  600 mg Oral BID  . losartan  50 mg Oral Daily  . mirabegron ER  50 mg Oral Daily  . oseltamivir  75 mg Oral BID  . pantoprazole  40 mg Oral Daily  . polyethylene glycol  17 g Oral BID  . potassium chloride SA  20 mEq Oral Daily  . sertraline  50 mg Oral Daily  . sodium chloride  3 mL Intravenous Q12H   Continuous Infusions:   Active Problems:   Chest pain   Influenza with respiratory manifestations    Time spent: 35 minutes.     Nance Mccombs  [facility] Pager [phone]. If 7PM-7AM, please contact night-coverage at [email or web address], password [facility] 03/21/2013, 3:42 PM  LOS: 2 days

## 2013-03-21 NOTE — Evaluation (Addendum)
Occupational Therapy Evaluation Patient Details Name: [name] MRN: [id number] DOB: 12-Apr-1933 Today's Date: 03/21/2013 Time: 1829-9371 OT Time Calculation (min): 40 min  OT Assessment / Plan / Recommendation History of present illness 80 y.o. male admitted to [facility] on 03/19/13 with PMH of HTN, GERD, h/o CVA, CAD, Dementia, GERD who presented due to an episode of chest pain.  Dx with (+) flu B, acute diastolic CHF, and chest pain.     Clinical Impression   Pt is currently supervision level for selfcare tasks and related functional transfers.  Will have 24 hour supervision from his wife at discharge.  Feel he does have some dynamic balance impairments that can increase his risk of falling, so recommended shower seat for home.  Wife is in agreement and will purchase after discharge.  No further acute or post acute OT needs at this time.  Feel with continued PT his balance will improve to a safer level. Recommend 24 hour supervision for safety at discharge.     OT Assessment  Patient does not need any further OT services    Follow Up Recommendations  No OT follow up       Equipment Recommendations  None recommended by OT          Precautions / Restrictions Precautions Precautions: Fall;Other (comment) Precaution Comments: droplet precautions.  Restrictions Weight Bearing Restrictions: No   Pertinent Vitals/Pain No report of pain, dyspnea 2/4 with standing tasks and short distance mobility.    ADL  Eating/Feeding: Simulated;Independent Where Assessed - Eating/Feeding: Edge of bed Grooming: Simulated;Supervision/safety Where Assessed - Grooming: Unsupported standing Upper Body Bathing: Simulated;Set up Where Assessed - Upper Body Bathing: Unsupported sitting Lower Body Bathing: Simulated;Supervision/safety Where Assessed - Lower Body Bathing: Unsupported sit to stand Upper Body Dressing: Simulated;Supervision/safety Where Assessed - Upper Body Dressing: Unsupported  sitting Lower Body Dressing: Simulated;Supervision/safety Where Assessed - Lower Body Dressing: Unsupported sit to stand Toilet Transfer: Performed;Supervision/safety Toilet Transfer Method: Other (comment) (ambulate without assistive device) Toilet Transfer Equipment: Comfort height toilet;Grab bars Toileting - Clothing Manipulation and Hygiene: Simulated;Supervision/safety Where Assessed - Best boy and Hygiene: Sit to stand from 3-in-1 or toilet Tub/Shower Transfer: Simulated;Supervision/safety Tub/Shower Transfer Method: Ambulating Transfers/Ambulation Related to ADLs: Pt is currently supervision level for mobility in the room to the bathroom and sink with supervision. ADL Comments: Pt with slight balance impairment, more significant with higher level tasks.  Feel he is at supervision level for selfcare tasks and functional transfers at this time.  Recommend shower seat for safety at home.  Wife and step daughter in agreement with this and will look into purchasing one after discharge.      OT Goals(Current goals can be found in the care plan section)    Visit Information  Last OT Received On: 03/21/13 Assistance Needed: +1 History of Present Illness: 80 y.o. male admitted to [facility] on 03/19/13 with PMH of HTN, GERD, h/o CVA, CAD, Dementia, GERD who presented due to an episode of chest pain.  Dx with (+) flu B, acute diastolic CHF, and chest pain.         Prior Functioning     Home Living Available Help at Discharge: Family Type of Home: House Home Access: Stairs to enter [employer] of Steps: 2-3 Entrance Stairs-Rails: Can reach both Home Layout: One level  Lives With: Spouse Prior Function Level of Independence: Independent Comments: pt has walker, cane, scooter for outside or long distance.         Vision/Perception Vision -  History Baseline Vision: No visual deficits Patient Visual Report: No change from baseline Vision -  Assessment Vision Assessment: Vision not tested Perception Perception: Within Functional Limits Praxis Praxis: Intact   Cognition  Cognition Arousal/Alertness: Awake/alert Behavior During Therapy: WFL for tasks assessed/performed Overall Cognitive Status: Within Functional Limits for tasks assessed    Extremity/Trunk Assessment Upper Extremity Assessment Upper Extremity Assessment: Overall WFL for tasks assessed Lower Extremity Assessment Lower Extremity Assessment: Defer to PT evaluation Cervical / Trunk Assessment Cervical / Trunk Assessment: Normal     Mobility Bed Mobility Overal bed mobility: Needs Assistance Bed Mobility: Supine to Sit;Sit to Supine Supine to sit: Supervision Sit to supine: Supervision Transfers Overall transfer level: Needs assistance Transfers: Sit to/from Stand Sit to Stand: Supervision General transfer comment: Pt able to perform sit to stand from the EOB with supervision.        Balance Balance Overall balance assessment: Needs assistance Sitting-balance support: No upper extremity supported Sitting balance-Leahy Scale: Good Standing balance support: During functional activity Standing balance-Leahy Scale: Fair Standing balance comment: Pt able to stand with feet together and eyes open with close supervision.  Needed min assist for standing with feet together and eyes closed after 10 seconds.   End of Session OT - End of Session Activity Tolerance: Patient tolerated treatment well Patient left: in bed;with call bell/phone within reach;with family/visitor present Nurse Communication: Mobility status  GO Functional Assessment Tool Used: clinical judgement Functional Limitation: Self care Self Care Current Status (E3662): At least 1 percent but less than 20 percent impaired, limited or restricted Self Care Goal Status (H4765): At least 1 percent but less than 20 percent impaired, limited or restricted Self Care Discharge Status [phone]): At  least 1 percent but less than 20 percent impaired, limited or restricted   [place] OTR/L 03/21/2013, 4:04 PM

## 2013-03-21 NOTE — Evaluation (Signed)
Physical Therapy Evaluation Patient Details Name: [name] MRN: [id number] DOB: 07-24-1933 Today's Date: 03/21/2013 Time: 1009-1030 PT Time Calculation (min): 21 min  PT Assessment / Plan / Recommendation History of Present Illness  80 y.o. male admitted to [facility] on 03/19/13 with PMH of HTN, GERD, h/o CVA, CAD, Dementia, GERD who presented due to an episode of chest pain.  Dx with (+) flu B, acute diastolic CHF, and chest pain.    Clinical Impression  Pt reports some mild DOE with gait with RW. Limited by fatigue and generalized weakness.  O2 sats and HR stable during activity on RA.  Wife was not present to help with hx.  We will need to check with her to determine baseline and need for equipment/HH services at d/c.   PT to follow acutely for deficits listed below.       PT Assessment  Patient needs continued PT services    Follow Up Recommendations  Home health PT;Supervision/Assistance - 24 hour    Does the patient have the potential to tolerate intense rehabilitation     NA  Barriers to Discharge   None      Equipment Recommendations  None recommended by PT    Recommendations for Other Services   None  Frequency Min 3X/week    Precautions / Restrictions Precautions Precautions: Fall;Other (comment) Precaution Comments: droplet precautions.    Pertinent Vitals/Pain HR 61 O2 sats 97% on RA during gait.  DOE reported as 1/4.      Mobility  Bed Mobility Overal bed mobility: Needs Assistance Bed Mobility: Supine to Sit Supine to sit: Min guard General bed mobility comments: to support trunk during transition to sitting. Tendancy towards posterior LOB.  [place] on bed rail for support.  Transfers Overall transfer level: Needs assistance Transfers: Sit to/from Stand Sit to Stand: Min assist General transfer comment: min assist to support trunk during transitions.  Bed elevated due to pt's height.   Ambulation/Gait Ambulation/Gait assistance: Min guard Ambulation  Distance (Feet): 150 Feet Assistive device: Rolling walker (2 wheeled) Gait Pattern/deviations: Step-through pattern;Staggering left;Staggering right;Shuffle General Gait Details: pt with mildly staggering gait pattern even with RW for support of bil upper extremities.  Reports 1/4 DOE, O2 sats and HR stable on RA during gait.  Needed min assist to help steer RW.  Not sure if he used RW PTA and pt unable to tell me.          PT Diagnosis: Difficulty walking;Generalized weakness;Abnormality of gait  PT Problem List: Decreased strength;Decreased activity tolerance;Decreased balance;Decreased mobility;Decreased cognition;Decreased knowledge of use of DME PT Treatment Interventions: DME instruction;Gait training;Stair training;Functional mobility training;Therapeutic activities;Therapeutic exercise;Balance training;Neuromuscular re-education;Cognitive remediation;Patient/family education     PT Goals(Current goals can be found in the care plan section) Acute Rehab PT Goals Patient Stated Goal: to get better and go home with his wife PT Goal Formulation: With patient Time For Goal Achievement: 04/04/13 Potential to Achieve Goals: Good  Visit Information  Last PT Received On: 03/21/13 Assistance Needed: +1 History of Present Illness: 80 y.o. male admitted to [facility] on 03/19/13 with PMH of HTN, GERD, h/o CVA, CAD, Dementia, GERD who presented due to an episode of chest pain.  Dx with (+) flu B, acute diastolic CHF, and chest pain.         Prior [place] expects to be discharged to:: Private residence Living Arrangements: Spouse/significant other Additional Comments: No family present to confirm hx.  Pt with hx of dementia.  Prior  Function Comments: No family to confirm independence PTA.  Communication Communication: No difficulties    Cognition  Cognition Arousal/Alertness: Awake/alert Behavior During Therapy: WFL for tasks assessed/performed Overall  Cognitive Status: No family/caregiver present to determine baseline cognitive functioning    Extremity/Trunk Assessment Upper Extremity Assessment Upper Extremity Assessment: Defer to OT evaluation Lower Extremity Assessment Lower Extremity Assessment: Generalized weakness Cervical / Trunk Assessment Cervical / Trunk Assessment: Normal   Balance Balance Overall balance assessment: Needs assistance Sitting-balance support: Bilateral upper extremity supported;Feet supported Sitting balance-Leahy Scale: Good Standing balance support: Bilateral upper extremity supported Standing balance-Leahy Scale: Poor  End of Session PT - End of Session Equipment Utilized During Treatment: Gait belt Activity Tolerance: Patient limited by fatigue Patient left: in chair;with call bell/phone within reach Nurse Communication: Mobility status  GP Functional Assessment Tool Used: assist level Functional Limitation: Mobility: Walking and moving around Mobility: Walking and Moving Around Current Status (E3329): At least 20 percent but less than 40 percent impaired, limited or restricted Mobility: Walking and Moving Around Goal Status [phone]): At least 1 percent but less than 20 percent impaired, limited or restricted   Jarin Cornfield B. [place], [place], DPT [phone]   03/21/2013, 12:01 PM

## 2013-03-21 NOTE — Care Management Note (Unsigned)
    Page 1 of 1   03/21/2013     9:57:11 AM   CARE MANAGEMENT NOTE 03/21/2013  Patient:  [name], [name]   Account Number:  [id number]  Date Initiated:  03/21/2013  Documentation initiated by:  GRAVES-BIGELOW,Quirino Kakos  Subjective/Objective Assessment:   Pt admitted for Chest pain, cough & congestion. Pt is from home with wife.     Action/Plan:   CM will continue to monitor for disposition needs.   Anticipated DC Date:  03/23/2013   Anticipated DC Plan:  [place]  CM consult      Choice offered to / List presented to:             Status of service:  In process, will continue to follow Medicare Important Message given?   (If response is "NO", the following Medicare IM given date fields will be blank) Date Medicare IM given:   Date Additional Medicare IM given:    Discharge Disposition:    Per UR Regulation:  Reviewed for med. necessity/level of care/duration of stay  If discussed at [place] of Stay Meetings, dates discussed:    Comments:

## 2013-03-22 DIAGNOSIS — J189 Pneumonia, unspecified organism: Secondary | ICD-10-CM

## 2013-03-22 LAB — BASIC METABOLIC PANEL
BUN: 10 mg/dL (ref 6–23)
CHLORIDE: 107 meq/L (ref 96–112)
CO2: 24 mEq/L (ref 19–32)
Calcium: 8.7 mg/dL (ref 8.4–10.5)
Creatinine, Ser: 0.8 mg/dL (ref 0.50–1.35)
GFR calc Af Amer: >90 mL/min (ref >90)
GFR, EST NON AFRICAN AMERICAN: 84 mL/min — AB (ref >90)
GLUCOSE: 115 mg/dL — AB (ref 70–99)
POTASSIUM: 3.5 meq/L — AB (ref 3.7–5.3)
Sodium: 146 mEq/L (ref 137–147)

## 2013-03-22 LAB — PRO B NATRIURETIC PEPTIDE: Pro B Natriuretic peptide (BNP): 96.2 pg/mL (ref 0–450)

## 2013-03-22 LAB — CBC
HCT: 41.9 % (ref 39.0–52.0)
Hemoglobin: 14.7 g/dL (ref 13.0–17.0)
MCH: 31.5 pg (ref 26.0–34.0)
MCHC: 35.1 g/dL (ref 30.0–36.0)
MCV: 89.7 fL (ref 78.0–100.0)
Platelets: 195 10*3/uL (ref 150–400)
RBC: 4.67 MIL/uL (ref 4.22–5.81)
RDW: 13.6 % (ref 11.5–15.5)
WBC: 3.8 10*3/uL — ABNORMAL LOW (ref 4.0–10.5)

## 2013-03-22 LAB — CULTURE, RESPIRATORY

## 2013-03-22 LAB — CULTURE, RESPIRATORY W GRAM STAIN: Culture: NORMAL

## 2013-03-22 MED ORDER — LEVOFLOXACIN 750 MG PO TABS: 750.0000 mg | ORAL_TABLET | Freq: Every day | ORAL | Status: DC

## 2013-03-22 MED ORDER — POTASSIUM CHLORIDE CRYS ER 20 MEQ PO TBCR
40.0000 meq | EXTENDED_RELEASE_TABLET | Freq: Once | ORAL | Status: AC
  Administered 2013-03-22: 40 meq via ORAL
  Filled 2013-03-22: qty 2

## 2013-03-22 MED ORDER — ALBUTEROL SULFATE HFA 108 (90 BASE) MCG/ACT IN AERS: 2.0000 | INHALATION_SPRAY | Freq: Four times a day (QID) | RESPIRATORY_TRACT | Status: DC | PRN

## 2013-03-22 MED ORDER — GUAIFENESIN ER 600 MG PO TB12: 600.0000 mg | ORAL_TABLET | Freq: Two times a day (BID) | ORAL | Status: DC

## 2013-03-22 MED ORDER — OSELTAMIVIR PHOSPHATE 75 MG PO CAPS: 75.0000 mg | ORAL_CAPSULE | Freq: Two times a day (BID) | ORAL | Status: DC

## 2013-03-22 NOTE — Discharge Instructions (Signed)

## 2013-03-22 NOTE — H&P (Signed)
Physician Discharge Summary  [name] [id number] DOB: [birth year] DOA: 03/19/2013  PCP: [name], MD  Admit date: 03/19/2013 Discharge date: 03/22/2013  Time spent: 35 minutes  Recommendations for Outpatient Follow-up:  1. Follow up with primary cardiologist , consider further evaluation for episode of chest pain.  2. Follow up with PCP; resolution of PNA, hemoptysis.   Discharge Diagnoses:     Influenza with respiratory manifestations   PNA (pneumonia)   Acute Diastolic Heart Failure exacerbation.    Chest pain     Discharge Condition: improved.   Diet recommendation:Heart Healthy.    Filed Weights   03/20/13 0900  Weight: 108.863 kg (240 lb)    History of present illness:  [name] is a 80yo man with PMH of HTN, GERD, h/o CVA, CAD, Dementia, GERD who presented due to an episode of chest pain. During my evaluation, [name] was somewhat sleepy and some of the history was obtained from family (wife in the room) and ED provider. Per this history, [name] developed midsternal chest pain, early in the morning, which he described as an ache. He took an aspirin and the pain lasted about 10 minutes. When he arrived at the ED, he was chest pain free. Per his family, he wanted to leave the ED at that time. However, his wife reported that he was also suffering from 2 days of "not being himself." She notes that he has had subjective fevers, generalized weakness and associated cough, congestion, SOB, wheezing. He has had to sit in the recliner at night to sleep. When asked if he had pains or a headache he stated "I ache all over" but he specifically denied neck pain. Further symptoms include decreased PO intake including fluids, nausea and inability to walk this morning. His wife also notes that his demeanor is very different than his usual.    Hospital Course:  Influenza with respiratory manifestations //PNA  - Influenza panel + for flu B  - Continue with Tamiflu day 4.  Need 2 more days.   - Supportive care  -Patient alert, now afebrile. MS improved.  -Patient with productive cough, yellow sputum. Will cover for PNA.  -Continue with ceftriaxone and Azithromycin day 3. Discharge on Levaquin for 4 more days.   -sputum culture respiratory flora.   Hemoptysis; very small amount. Will hold aspirin. Stable. Advised to stop plavix if it get worse.  Acute Diastolic Heart Failure: crackles on lung exam, Positive JVD. Treated with IV lasix 20 mg daily. BNP today at 99. Transition to home dose of lasix.  Chest pain, somewhat typical with h/o CAD  - CE X 3 negative  - NTG for pain control  - may be related to acute illness  - Continue home plavix, aspirin.  Hypokalemia; replete.  Constipation,  Continue with Miralax. KUB negative. Resolved. Had an episode of diarrhea after laxatives.  HTN  - BP stable.  - Continue home medications including, coreg, lasix, losartan.  BPH  - Continuing home finasteride.  DVT PPx: Heparin SQ   Procedures:  none  Consultations:  none  Discharge Exam: Filed Vitals:   03/22/13 0500  BP: 158/80  Pulse: 58  Temp: 98 F (36.7 C)  Resp:     General: No distress.  Cardiovascular: S 1, S 2 RRR Respiratory: CTA  Discharge Instructions  Discharge Orders   Future Appointments Provider Department Dept Phone   04/13/2013 9:30 AM Sueanne Margarita, MD [place] [phone]   04/27/2013 8:30 AM [facility]  [place] Office [phone]   04/28/2013 2:00 PM [facility] [address] [phone]   Future Orders Complete By Expires   Diet - low sodium heart healthy  As directed    Increase activity slowly  As directed        Medication List    STOP taking these medications       aspirin 81 MG chewable tablet     diclofenac 75 MG EC tablet  Commonly known as:  VOLTAREN      TAKE these medications       albuterol 108 (90 BASE) MCG/ACT inhaler   Commonly known as:  PROVENTIL HFA;VENTOLIN HFA  Inhale 2 puffs into the lungs every 6 (six) hours as needed for wheezing or shortness of breath.     carvedilol 12.5 MG tablet  Commonly known as:  COREG  Take 1 tablet (12.5 mg total) by mouth 2 (two) times daily with a meal.     CENTURY CARDIO HEALTH FORMULA PO  Take 1 tablet by mouth 2 (two) times daily.     clopidogrel 75 MG tablet  Commonly known as:  PLAVIX  Take 1 tablet (75 mg total) by mouth daily.     donepezil 10 MG tablet  Commonly known as:  ARICEPT  TAKE 1 TABLET BY MOUTH EVERY DAY     finasteride 5 MG tablet  Commonly known as:  PROSCAR  Take 5 mg by mouth daily.     furosemide 20 MG tablet  Commonly known as:  LASIX  Take 1 tablet (20 mg total) by mouth daily after breakfast.     guaiFENesin 600 MG 12 hr tablet  Commonly known as:  MUCINEX  Take 1 tablet (600 mg total) by mouth 2 (two) times daily.     levofloxacin 750 MG tablet  Commonly known as:  LEVAQUIN  Take 1 tablet (750 mg total) by mouth daily.     losartan 50 MG tablet  Commonly known as:  COZAAR  Take 1 tablet (50 mg total) by mouth daily.     multivitamin with minerals Tabs tablet  Take 1 tablet by mouth daily.     MYRBETRIQ 25 MG Tb24 tablet  Generic drug:  mirabegron ER  Take 50 mg by mouth daily.     nitroGLYCERIN 0.4 MG SL tablet  Commonly known as:  NITROSTAT  Place 0.4 mg under the tongue every 5 (five) minutes as needed. For chest pain     omeprazole 40 MG capsule  Commonly known as:  PRILOSEC  Take 40 mg by mouth daily.     oseltamivir 75 MG capsule  Commonly known as:  TAMIFLU  Take 1 capsule (75 mg total) by mouth 2 (two) times daily.     potassium chloride SA 20 MEQ tablet  Commonly known as:  K-DUR,KLOR-CON  Take 1 tablet (20 mEq total) by mouth daily.     rosuvastatin 20 MG tablet  Commonly known as:  CRESTOR  Take  1/2 tablet by mouth  daily     sertraline 50 MG tablet  Commonly known as:  ZOLOFT  Take 50 mg  by mouth daily.       Allergies  Allergen Reactions  . Latex Rash  . Sulfa Antibiotics Hives and Rash       Follow-up Information   Follow up with SHAW,KIMBERLEE, MD In 1 week.   Specialty:  Family Medicine   Contact information:   [phone]  [address] [place] [address] [phone]        The results of significant diagnostics from this hospitalization (including imaging, microbiology, ancillary and laboratory) are listed below for reference.    Significant Diagnostic Studies: Dg Chest 2 View  03/19/2013   CLINICAL DATA:  Chest pain  EXAM: CHEST  2 VIEW  COMPARISON:  CT ABD-PEL WO/W CM dated 02/08/2013; DG CHEST 2 VIEW dated 05/06/2011  FINDINGS: Low lung volumes. Elevated right hemidiaphragm. No focal regions of consolidation or focal infiltrates. Calcified granuloma projects along the superior lateral apex of the left upper lobe. The osseous structures unremarkable.  IMPRESSION: No active cardiopulmonary disease.   Electronically Signed   By: Margaree Mackintosh M.D.   On: 03/19/2013 07:57   Dg Abd 1 View  03/20/2013   CLINICAL DATA:  Abdominal pain  EXAM: ABDOMEN - 1 VIEW  COMPARISON:  None.  FINDINGS: Scattered large and small bowel gas is noted. No obstructive changes are seen. No focal mass lesion or abnormal calcifications are noted.  IMPRESSION: No acute abnormality noted.   Electronically Signed   By: Inez Catalina M.D.   On: 03/20/2013 13:11    Microbiology: Recent Results (from the past 240 hour(s))  CULTURE, EXPECTORATED SPUTUM-ASSESSMENT     Status: None   Collection Time    03/20/13 10:35 AM      Result Value Range Status   Specimen Description SPUTUM   Final   Special Requests NONE   Final   Sputum evaluation     Final   Value: THIS SPECIMEN IS ACCEPTABLE. RESPIRATORY CULTURE REPORT TO FOLLOW.   Report Status 03/20/2013 FINAL   Final  CULTURE, RESPIRATORY (NON-EXPECTORATED)     Status: None   Collection Time    03/20/13 10:35 AM      Result Value  Range Status   Specimen Description SPUTUM   Final   Special Requests NONE   Final   Gram Stain     Final   Value: ABUNDANT WBC PRESENT, PREDOMINANTLY PMN     RARE SQUAMOUS EPITHELIAL CELLS PRESENT     FEW GRAM POSITIVE COCCI IN PAIRS     FEW GRAM NEGATIVE RODS     FEW GRAM POSITIVE RODS   Culture     Final   Value: NORMAL OROPHARYNGEAL FLORA     Performed at [facility]   Report Status 03/22/2013 FINAL   Final     Labs: Basic Metabolic Panel:  Recent Labs Lab 03/19/13 0644 03/19/13 1345 03/20/13 0500 03/21/13 0935 03/22/13 0511  NA 138  --  136* 139 146  K 3.8  --  3.6* 3.7 3.5*  CL 104  --  102 104 107  CO2 21  --  20 24 24   GLUCOSE 131*  --  127* 142* 115*  BUN 10  --  12 12 10   CREATININE 0.73 0.72 0.72 0.79 0.80  CALCIUM 8.6  --  8.5 8.5 8.7   Liver Function Tests: No results found for this basename: AST, ALT, ALKPHOS, BILITOT, PROT, ALBUMIN,  in the last 168 hours No results found for this basename: LIPASE, AMYLASE,  in the last 168 hours No results found for this basename: AMMONIA,  in the last 168 hours CBC:  Recent Labs Lab 03/19/13 0644 03/19/13 1345 03/20/13 0500 03/21/13 1244 03/22/13 0511  WBC 8.8 9.6 8.6 3.8* 3.8*  HGB 15.8 15.5 14.7 15.5 14.7  HCT 44.3 43.6 42.5 44.0 41.9  MCV 91.0 90.8 91.2  90.9 89.7  PLT 135* 145* 141* 178 195   Cardiac Enzymes:  Recent Labs Lab 03/19/13 1345 03/19/13 1818 03/19/13 2357  TROPONINI <0.30 <0.30 <0.30   BNP: BNP (last 3 results)  Recent Labs  03/19/13 0644 03/22/13 0511  PROBNP 517.6* 96.2   CBG: No results found for this basename: GLUCAP,  in the last 168 hours     Signed:  Sheria Rosello  [facility] 03/22/2013, 11:43 AM

## 2013-04-05 NOTE — Discharge Summary (Addendum)
Physician Discharge Summary   [name] [id number] DOB: 02-05-1934 DOA: 03/19/2013  PCP: [name], MD  Admit date: 03/19/2013  Discharge date: 03/22/2013  Time spent: 35 minutes   Recommendations for Outpatient Follow-up:  1. Follow up with primary cardiologist , consider further evaluation for episode of chest pain.  2. Follow up with PCP; resolution of PNA, hemoptysis.   Discharge Diagnoses:  Influenza with respiratory manifestations  PNA (pneumonia)  Acute Diastolic Heart Failure exacerbation.  Chest pain   Discharge Condition: improved.   Diet recommendation:Heart Healthy.  Filed Weights    03/20/13 0900   Weight:  108.863 kg (240 lb)    History of present illness:  [name] is a 80yo man with PMH of HTN, GERD, h/o CVA, CAD, Dementia, GERD who presented due to an episode of chest pain. During my evaluation, [name] was somewhat sleepy and some of the history was obtained from family (wife in the room) and ED provider. Per this history, [name] developed midsternal chest pain, early in the morning, which he described as an ache. He took an aspirin and the pain lasted about 10 minutes. When he arrived at the ED, he was chest pain free. Per his family, he wanted to leave the ED at that time. However, his wife reported that he was also suffering from 2 days of "not being himself." She notes that he has had subjective fevers, generalized weakness and associated cough, congestion, SOB, wheezing. He has had to sit in the recliner at night to sleep. When asked if he had pains or a headache he stated "I ache all over" but he specifically denied neck pain. Further symptoms include decreased PO intake including fluids, nausea and inability to walk this morning. His wife also notes that his demeanor is very different than his usual.    Hospital Course:  Influenza with respiratory manifestations //PNA  - Influenza panel + for flu B  - Continue with Tamiflu day 4. Need 2 more  days.  - Supportive care  -Patient alert, now afebrile. MS improved.  -Patient with productive cough, yellow sputum. Will cover for PNA.  -Continue with ceftriaxone and Azithromycin day 3. Discharge on Levaquin for 4 more days.  -sputum culture respiratory flora.   Hemoptysis; very small amount. Will hold aspirin. Stable. Advised to stop plavix if it get worse.   Acute Diastolic Heart Failure: crackles on lung exam, Positive JVD. Treated with IV lasix 20 mg daily. BNP today at 99. Transition to home dose of lasix.   Chest pain, somewhat typical with h/o CAD  - CE X 3 negative  - NTG for pain control  - may be related to acute illness  - Continue home plavix, aspirin.   Hypokalemia; replete.   Constipation, Continue with Miralax. KUB negative. Resolved. Had an episode of diarrhea after laxatives.   HTN  - BP stable.  - Continue home medications including, coreg, lasix, losartan.   BPH  - Continuing home finasteride.   DVT PPx: Heparin SQ   Procedures:  None  Consultations:  None  Discharge Exam:  Filed Vitals:    03/22/13 0500   BP:  158/80   Pulse:  58   Temp:  98 F (36.7 C)   Resp:     General: No distress.  Cardiovascular: S 1, S 2 RRR  Respiratory: CTA   Discharge Instructions  Discharge Orders    Future Appointments  Provider  Department  Dept Phone    04/13/2013 9:30 AM  Sueanne Margarita, MD  [place]  [phone]    04/27/2013 8:30 AM  [facility]  [place] Office  [phone]    04/28/2013 2:00 PM  [facility] Echo 3  [facility] [address]  [phone]    Future Orders  Complete By  Expires    Diet - low sodium heart healthy  As directed     Increase activity slowly  As directed         Medication List     STOP taking these medications       aspirin 81 MG chewable tablet    diclofenac 75 MG EC tablet    Commonly known as: VOLTAREN     TAKE these medications       albuterol 108 (90  BASE) MCG/ACT inhaler    Commonly known as: PROVENTIL HFA;VENTOLIN HFA    Inhale 2 puffs into the lungs every 6 (six) hours as needed for wheezing or shortness of breath.    carvedilol 12.5 MG tablet    Commonly known as: COREG    Take 1 tablet (12.5 mg total) by mouth 2 (two) times daily with a meal.    CENTURY CARDIO HEALTH FORMULA PO    Take 1 tablet by mouth 2 (two) times daily.    clopidogrel 75 MG tablet    Commonly known as: PLAVIX    Take 1 tablet (75 mg total) by mouth daily.    donepezil 10 MG tablet    Commonly known as: ARICEPT    TAKE 1 TABLET BY MOUTH EVERY DAY    finasteride 5 MG tablet    Commonly known as: PROSCAR    Take 5 mg by mouth daily.    furosemide 20 MG tablet    Commonly known as: LASIX    Take 1 tablet (20 mg total) by mouth daily after breakfast.    guaiFENesin 600 MG 12 hr tablet    Commonly known as: MUCINEX    Take 1 tablet (600 mg total) by mouth 2 (two) times daily.    levofloxacin 750 MG tablet    Commonly known as: LEVAQUIN    Take 1 tablet (750 mg total) by mouth daily.    losartan 50 MG tablet    Commonly known as: COZAAR    Take 1 tablet (50 mg total) by mouth daily.    multivitamin with minerals Tabs tablet    Take 1 tablet by mouth daily.    MYRBETRIQ 25 MG Tb24 tablet    Generic drug: mirabegron ER    Take 50 mg by mouth daily.    nitroGLYCERIN 0.4 MG SL tablet    Commonly known as: NITROSTAT    Place 0.4 mg under the tongue every 5 (five) minutes as needed. For chest pain    omeprazole 40 MG capsule    Commonly known as: PRILOSEC    Take 40 mg by mouth daily.    oseltamivir 75 MG capsule    Commonly known as: TAMIFLU    Take 1 capsule (75 mg total) by mouth 2 (two) times daily.    potassium chloride SA 20 MEQ tablet    Commonly known as: K-DUR,KLOR-CON    Take 1 tablet (20 mEq total) by mouth daily.    rosuvastatin 20 MG tablet    Commonly known as: CRESTOR    Take 1/2 tablet by mouth daily    sertraline 50 MG tablet     Commonly  known as: ZOLOFT    Take 50 mg by mouth daily.      Allergies   Allergen  Reactions   .  Latex  Rash   .  Sulfa Antibiotics  Hives and Rash        Follow-up Information    Follow up with SHAW,KIMBERLEE, MD In 1 week.    Specialty: Family Medicine    Contact information:    [address] [place] [address]  [phone]       The results of significant diagnostics from this hospitalization (including imaging, microbiology, ancillary and laboratory) are listed below for reference.   Significant Diagnostic Studies:  Dg Chest 2 View  03/19/2013 CLINICAL DATA: Chest pain EXAM: CHEST 2 VIEW COMPARISON: CT ABD-PEL WO/W CM dated 02/08/2013; DG CHEST 2 VIEW dated 05/06/2011 FINDINGS: Low lung volumes. Elevated right hemidiaphragm. No focal regions of consolidation or focal infiltrates. Calcified granuloma projects along the superior lateral apex of the left upper lobe. The osseous structures unremarkable. IMPRESSION: No active cardiopulmonary disease. Electronically Signed By: Margaree Mackintosh M.D. On: 03/19/2013 07:57  Dg Abd 1 View  03/20/2013 CLINICAL DATA: Abdominal pain EXAM: ABDOMEN - 1 VIEW COMPARISON: None. FINDINGS: Scattered large and small bowel gas is noted. No obstructive changes are seen. No focal mass lesion or abnormal calcifications are noted. IMPRESSION: No acute abnormality noted. Electronically Signed By: Inez Catalina M.D. On: 03/20/2013 13:11  Microbiology:  Recent Results (from the past 240 hour(s))   CULTURE, EXPECTORATED SPUTUM-ASSESSMENT Status: None    Collection Time    03/20/13 10:35 AM   Result  Value  Range  Status    Specimen Description  SPUTUM   Final    Special Requests  NONE   Final    Sputum evaluation    Final    Value:  THIS SPECIMEN IS ACCEPTABLE. RESPIRATORY CULTURE REPORT TO FOLLOW.    Report Status  03/20/2013 FINAL   Final   CULTURE, RESPIRATORY (NON-EXPECTORATED) Status: None    Collection Time    03/20/13 10:35 AM    Result  Value  Range  Status    Specimen Description  SPUTUM   Final    Special Requests  NONE   Final    Gram Stain    Final    Value:  ABUNDANT WBC PRESENT, PREDOMINANTLY PMN     RARE SQUAMOUS EPITHELIAL CELLS PRESENT     FEW GRAM POSITIVE COCCI IN PAIRS     FEW GRAM NEGATIVE RODS     FEW GRAM POSITIVE RODS    Culture    Final    Value:  NORMAL OROPHARYNGEAL FLORA     Performed at [facility]    Report Status  03/22/2013 FINAL   Final    Labs:  Basic Metabolic Panel:   Recent Labs  Lab  03/19/13 0644  03/19/13 1345  03/20/13 0500  03/21/13 0935  03/22/13 0511   NA  138  --  136*  139  146   K  3.8  --  3.6*  3.7  3.5*   CL  104  --  102  104  107   CO2  21  --  20  24  24    GLUCOSE  131*  --  127*  142*  115*   BUN  10  --  12  12  10    CREATININE  0.73  0.72  0.72  0.79  0.80   CALCIUM  8.6  --  8.5  8.5  8.7    Liver Function Tests:  No results found for this basename: AST, ALT, ALKPHOS, BILITOT, PROT, ALBUMIN, in the last 168 hours  No results found for this basename: LIPASE, AMYLASE, in the last 168 hours  No results found for this basename: AMMONIA, in the last 168 hours  CBC:   Recent Labs  Lab  03/19/13 0644  03/19/13 1345  03/20/13 0500  03/21/13 1244  03/22/13 0511   WBC  8.8  9.6  8.6  3.8*  3.8*   HGB  15.8  15.5  14.7  15.5  14.7   HCT  44.3  43.6  42.5  44.0  41.9   MCV  91.0  90.8  91.2  90.9  89.7   PLT  135*  145*  141*  178  195    Cardiac Enzymes:   Recent Labs  Lab  03/19/13 1345  03/19/13 1818  03/19/13 2357   TROPONINI  <0.30  <0.30  <0.30    BNP:  BNP (last 3 results)   Recent Labs   03/19/13 0644  03/22/13 0511   PROBNP  517.6*  96.2    CBG:  No results found for this basename: GLUCAP, in the last 168 hours  Signed:  Zyionna Pesce  [facility]  03/22/2013, 11:43 AM

## 2013-04-09 ENCOUNTER — Encounter: Payer: Self-pay | Admitting: Cardiology

## 2013-04-12 ENCOUNTER — Encounter: Payer: Self-pay | Admitting: General Surgery

## 2013-04-12 DIAGNOSIS — E7800 Pure hypercholesterolemia, unspecified: Secondary | ICD-10-CM

## 2013-04-12 DIAGNOSIS — I10 Essential (primary) hypertension: Secondary | ICD-10-CM | POA: Insufficient documentation

## 2013-04-12 DIAGNOSIS — I2510 Atherosclerotic heart disease of native coronary artery without angina pectoris: Secondary | ICD-10-CM | POA: Insufficient documentation

## 2013-04-12 DIAGNOSIS — I350 Nonrheumatic aortic (valve) stenosis: Secondary | ICD-10-CM | POA: Insufficient documentation

## 2013-04-13 ENCOUNTER — Encounter: Payer: Self-pay | Admitting: General Surgery

## 2013-04-13 ENCOUNTER — Encounter: Payer: Self-pay | Admitting: Cardiology

## 2013-04-13 ENCOUNTER — Ambulatory Visit (INDEPENDENT_AMBULATORY_CARE_PROVIDER_SITE_OTHER): Payer: Commercial Managed Care - HMO | Admitting: Cardiology

## 2013-04-13 ENCOUNTER — Other Ambulatory Visit: Payer: Medicare HMO

## 2013-04-13 VITALS — BP 138/90 | HR 58 | Ht 75.0 in | Wt 247.0 lb

## 2013-04-13 DIAGNOSIS — I10 Essential (primary) hypertension: Secondary | ICD-10-CM

## 2013-04-13 DIAGNOSIS — I350 Nonrheumatic aortic (valve) stenosis: Secondary | ICD-10-CM

## 2013-04-13 DIAGNOSIS — E7800 Pure hypercholesterolemia, unspecified: Secondary | ICD-10-CM

## 2013-04-13 DIAGNOSIS — R609 Edema, unspecified: Secondary | ICD-10-CM | POA: Insufficient documentation

## 2013-04-13 DIAGNOSIS — I359 Nonrheumatic aortic valve disorder, unspecified: Secondary | ICD-10-CM

## 2013-04-13 DIAGNOSIS — I2510 Atherosclerotic heart disease of native coronary artery without angina pectoris: Secondary | ICD-10-CM

## 2013-04-13 LAB — BASIC METABOLIC PANEL
BUN: 13 mg/dL (ref 6–23)
CO2: 23 meq/L (ref 19–32)
CREATININE: 0.8 mg/dL (ref 0.4–1.5)
Calcium: 9.2 mg/dL (ref 8.4–10.5)
Chloride: 112 mEq/L (ref 96–112)
GFR: 95.22 mL/min (ref >60.00)
GLUCOSE: 100 mg/dL — AB (ref 70–99)
Potassium: 4 mEq/L (ref 3.5–5.1)
SODIUM: 142 meq/L (ref 135–145)

## 2013-04-13 NOTE — Patient Instructions (Signed)
Your physician recommends that you continue on your current medications as directed. Please refer to the Current Medication list given to you today.  Your physician recommends that you go to the lab today for a BMET, LIPID, and ALT  Your physician has requested that you regularly monitor and record your blood pressure readings at home. Please use the same machine at the same time of day to check your readings and record them for one week and call us with the results.   Your physician wants you to follow-up in: 6 Months with Dr Mallie Snooks will receive a reminder letter in the mail two months in advance. If you don't receive a letter, please call our office to schedule the follow-up appointment.

## 2013-04-13 NOTE — Progress Notes (Signed)
[place], [name], [place]  [address] Phone: [phone] Fax:  [phone]  Date:  04/13/2013   ID:  [name], DOB 1933/03/03, MRN [id number]  PCP:  [name], MD  Cardiologist:  Fransico Him, MD     History of Present Illness: [name] is a 80 y.o. male with a history of ASCAD s/p remote PCI of left circ/LAD and prox PDA, HTN, dyslipidemia, mild to mod AI and moderate AS who presents today for followup.  He is doing well.  He denies any  dizziness, palpitations or syncope.  He was recently hospitalized with the flu but is doing much better.  He had a pain in his chest in the am he went to the ER.  He says that it was a hurting pain and when he got to the ER he was diagnosed with PNA.  He has not had any pain in his chest since then and had not had any pain prior to that event.  He has chronic DOE which is stable.  He has been feeling tired even before the PNA.  He has chronic LE edema which is stable   Wt Readings from Last 3 Encounters:  04/13/13 247 lb (112.038 kg)  03/20/13 240 lb (108.863 kg)  09/18/11 238 lb (107.956 kg)     Past Medical History  Diagnosis Date  . Hypertension   . Heart murmur   . BPH (benign prostatic hypertrophy)   . GERD (gastroesophageal reflux disease)   . Anxiety   . Headache(784.0)     relieved with OTC's  . Stroke     possible   . Sleep apnea     STOP BANG SCORE 5  . Dementia     early per pt- recently evaluated at [facility]- eccho7/13  , MRI head EPIC 6/13  . Bruises easily   . Unsteady gait   . Hyperlipidemia   . HBP (high blood pressure)   . Allergic rhinitis   . AI (aortic insufficiency)     Mild to Mod, Moderate AS Echo 02/2011  . Chronic fatigue   . Left carotid bruit 03/2008    w minimal obstruction on doppler  . OAB (overactive bladder)   . Dementia, vascular     Dr [name]  . Coronary artery disease     s/p PCI of left cir and LAD 2005 and PCI of prox PDA 7/09    Current Outpatient  Prescriptions  Medication Sig Dispense Refill  . aspirin 81 MG tablet Take 81 mg by mouth daily.      . carvedilol (COREG) 12.5 MG tablet Take 1 tablet (12.5 mg total) by mouth 2 (two) times daily with a meal.  180 tablet  3  . diclofenac (VOLTAREN) 75 MG EC tablet       . donepezil (ARICEPT) 10 MG tablet TAKE 1 TABLET BY MOUTH EVERY DAY  30 tablet  12  . finasteride (PROSCAR) 5 MG tablet Take 5 mg by mouth daily.      . furosemide (LASIX) 20 MG tablet Take 1 tablet (20 mg total) by mouth daily after breakfast.  90 tablet  3  . losartan (COZAAR) 50 MG tablet Take 1 tablet (50 mg total) by mouth daily.  90 tablet  3  . Multiple Vitamin (MULTIVITAMIN WITH MINERALS) TABS Take 1 tablet by mouth daily.      . nitroGLYCERIN (NITROSTAT) 0.4 MG SL tablet Place 0.4 mg under the tongue every 5 (five) minutes  as needed. For chest pain      . omeprazole (PRILOSEC) 40 MG capsule Take 40 mg by mouth daily.      . potassium chloride SA (K-DUR,KLOR-CON) 20 MEQ tablet Take 1 tablet (20 mEq total) by mouth daily.  90 tablet  3  . rosuvastatin (CRESTOR) 20 MG tablet Take  1/2 tablet by mouth  daily  90 tablet  3  . sertraline (ZOLOFT) 50 MG tablet Take 50 mg by mouth daily.       . clopidogrel (PLAVIX) 75 MG tablet Take 1 tablet (75 mg total) by mouth daily.  90 tablet  3   No current facility-administered medications for this visit.    Allergies:    Allergies  Allergen Reactions  . Latex Rash  . Sulfa Antibiotics Hives and Rash    Social History:  The patient  reports that he quit smoking about 22 years ago. He has never used smokeless tobacco. He reports that he drinks about 1.8 ounces of alcohol per week. He reports that he does not use illicit drugs.   Family History:  The patient's family history includes Lung cancer in his father. There is no history of Anesthesia problems.   ROS:  Please see the history of present illness.      All other systems reviewed and negative.   PHYSICAL EXAM: VS:  BP  138/90  Pulse 58  Ht 6\' 3"  (1.905 m)  Wt 247 lb (112.038 kg)  BMI 30.87 kg/m2 Well nourished, well developed, in no acute distress HEENT: normal Neck: no JVD Cardiac:  normal S1, S2; RRR; 2/6 SM at RUSB to LLSB Lungs:  clear to auscultation bilaterally, no wheezing, rhonchi or rales Abd: soft, nontender, no hepatomegaly Ext: trace edema Skin: warm and dry Neuro:  CNs 2-12 intact, no focal abnormalities noted  EKG:  Sinus bradycardia at 58bpm      ASSESSMENT AND PLAN:  1. ASCAD with no angina  - continue ASA/Plavix 2. HTN - mildly elevated  - continue Cozaar/Coreg  - I have asked him to check his BP daily for a week and call with the results 3. Dyslipidemia  - continue crestor  - check fasting lipids and ALT 4. Mild to moderate AI 5. Moderate AS  - 2D echo to reassess AS 6.  Chronic LE edema - stable  - continue Lasix  - check BMET  Followup with  Me in 6 months  Signed, Fransico Him, MD 04/13/2013 9:50 AM

## 2013-04-22 ENCOUNTER — Telehealth: Payer: Self-pay | Admitting: Cardiology

## 2013-04-22 DIAGNOSIS — I10 Essential (primary) hypertension: Secondary | ICD-10-CM

## 2013-04-22 NOTE — Telephone Encounter (Signed)
New message         bp readings for the week of 3/8 to 3/13 Sun 159/75 Mon 198/83 tues 198/84 Wed 165/75 thurs 153/75 fri 154/90

## 2013-04-22 NOTE — Telephone Encounter (Signed)
To Dr Turner to advise 

## 2013-04-23 NOTE — Telephone Encounter (Signed)
Increase Losartan to 100mg  daily and get nurse visit in 1 week with BMET

## 2013-04-25 NOTE — Telephone Encounter (Signed)
LVM on both home and cell for pt to return call

## 2013-04-27 ENCOUNTER — Other Ambulatory Visit: Payer: Medicare Other

## 2013-04-27 NOTE — Telephone Encounter (Signed)
LVM for pt to return call

## 2013-04-28 ENCOUNTER — Other Ambulatory Visit (HOSPITAL_COMMUNITY): Payer: Medicare Other

## 2013-04-28 MED ORDER — LOSARTAN POTASSIUM 50 MG PO TABS: 100.0000 mg | ORAL_TABLET | Freq: Every day | ORAL | Status: DC

## 2013-04-28 NOTE — Telephone Encounter (Signed)
I spoke with pt & he will increase his losartan to 100mg  daily. He has been out of town. Due to distance & driving pt will return to office on 05/09/13 to have a blood pressure check, bmp & already scheduled ECHO  Pt & wife states he has enough of his current prescription to take 2 50mg  tablets daily. He will keep a record of his blood pressure & bring with him to visit.  BP today per pt is :  163/75  Aurther Loft

## 2013-04-28 NOTE — Telephone Encounter (Signed)
Follow up ° ° ° ° ° °Returning [name]'s call °

## 2013-05-06 ENCOUNTER — Other Ambulatory Visit: Payer: Self-pay | Admitting: Cardiology

## 2013-05-06 DIAGNOSIS — I359 Nonrheumatic aortic valve disorder, unspecified: Secondary | ICD-10-CM

## 2013-05-09 ENCOUNTER — Other Ambulatory Visit (INDEPENDENT_AMBULATORY_CARE_PROVIDER_SITE_OTHER): Payer: Commercial Managed Care - HMO

## 2013-05-09 ENCOUNTER — Ambulatory Visit (HOSPITAL_COMMUNITY): Payer: Medicare HMO | Attending: Family Medicine | Admitting: Radiology

## 2013-05-09 ENCOUNTER — Ambulatory Visit (INDEPENDENT_AMBULATORY_CARE_PROVIDER_SITE_OTHER): Payer: Commercial Managed Care - HMO | Admitting: *Deleted

## 2013-05-09 ENCOUNTER — Encounter: Payer: Self-pay | Admitting: *Deleted

## 2013-05-09 ENCOUNTER — Telehealth: Payer: Self-pay | Admitting: General Surgery

## 2013-05-09 VITALS — BP 148/78 | HR 68 | Ht 75.0 in | Wt 243.4 lb

## 2013-05-09 DIAGNOSIS — I2510 Atherosclerotic heart disease of native coronary artery without angina pectoris: Secondary | ICD-10-CM

## 2013-05-09 DIAGNOSIS — I359 Nonrheumatic aortic valve disorder, unspecified: Secondary | ICD-10-CM | POA: Insufficient documentation

## 2013-05-09 DIAGNOSIS — E7800 Pure hypercholesterolemia, unspecified: Secondary | ICD-10-CM

## 2013-05-09 DIAGNOSIS — I10 Essential (primary) hypertension: Secondary | ICD-10-CM

## 2013-05-09 DIAGNOSIS — Z79899 Other long term (current) drug therapy: Secondary | ICD-10-CM

## 2013-05-09 LAB — HEPATIC FUNCTION PANEL
ALK PHOS: 89 U/L (ref 39–117)
ALT: 28 U/L (ref 0–53)
AST: 25 U/L (ref 0–37)
Albumin: 4.1 g/dL (ref 3.5–5.2)
BILIRUBIN DIRECT: 0.1 mg/dL (ref 0.0–0.3)
TOTAL PROTEIN: 6.9 g/dL (ref 6.0–8.3)
Total Bilirubin: 2.1 mg/dL — ABNORMAL HIGH (ref 0.3–1.2)

## 2013-05-09 LAB — LIPID PANEL
CHOL/HDL RATIO: 4
Cholesterol: 169 mg/dL (ref 0–200)
HDL: 46.3 mg/dL (ref >39.00)
LDL Cholesterol: 105 mg/dL — ABNORMAL HIGH (ref 0–99)
Triglycerides: 90 mg/dL (ref 0.0–149.0)
VLDL: 18 mg/dL (ref 0.0–40.0)

## 2013-05-09 NOTE — Telephone Encounter (Signed)
LVm for pt to return call. Dr Radford Pax thinks BP readings were too high and wants pt to start amlodipine 5 MG 1 tablet daily and Check BP daily for one week and then call with the results  Unable to reach pt. I LVM for pt to return call

## 2013-05-09 NOTE — Progress Notes (Signed)
Echocardiogram Performed. 

## 2013-05-09 NOTE — Progress Notes (Signed)
Pt comes in for blood pressure check after increasing Losartan to 100mg  daily on 04/28/13 He has already had bmp drawn & echo is scheduled for this afternoon  Office bp today:  148/78  HR 68  Pt has kept a bp diary after increasing losartan.  All bp are after taking medications  04/29/13  148/78 04/30/13  150/78 05/01/13  130/67 05/02/13  150/77 05/03/13  145/73 05/04/13  161/75 05/05/13  163/75 05/06/13  151/81 05/07/13 149/80 05/09/13 162/76  Debbie Pearline Cables RN

## 2013-05-10 ENCOUNTER — Other Ambulatory Visit: Payer: Self-pay | Admitting: *Deleted

## 2013-05-10 ENCOUNTER — Telehealth: Payer: Self-pay | Admitting: Cardiology

## 2013-05-10 MED ORDER — AMLODIPINE BESYLATE 5 MG PO TABS: 5.0000 mg | ORAL_TABLET | Freq: Every day | ORAL | Status: DC

## 2013-05-10 NOTE — Telephone Encounter (Signed)
New message ° ° ° °Pt is returning a nurses call from yesterday °

## 2013-05-10 NOTE — Telephone Encounter (Signed)
Instructed patient per note 3/30 in chart--begin amlodipine 5 mg by mouth once a day. Sent to [facility] in [place].  Instructed to check BP x 1 week and call with readings. Patient verbalizes understanding and agreement.

## 2013-05-11 NOTE — Telephone Encounter (Signed)
Telephone Encounter Info    Author Note Status Last Update User Last Update Date/Time   Rodman Key, RN Signed Rodman Key, RN 05/10/2013 3:47 PM         Telephone Encounter    Instructed patient per note 3/30 in chart--begin amlodipine 5 mg by mouth once a day.  Sent to [facility] in [place].  Instructed to check BP x 1 week and call with readings.  Patient verbalizes understanding and agreement.

## 2013-05-19 ENCOUNTER — Telehealth: Payer: Self-pay | Admitting: Cardiology

## 2013-05-19 NOTE — Telephone Encounter (Signed)
Please let patient know that his echo showed normal LVF with mildly thickened and stiff heart muscle and moderate AS with mildly leaky AV.  Compared to study 1 year ago the AV is stable.  REpeat echo in 1 year

## 2013-05-20 NOTE — Telephone Encounter (Signed)
LVM for pt to return call

## 2013-05-25 ENCOUNTER — Telehealth: Payer: Self-pay | Admitting: Cardiology

## 2013-05-25 NOTE — Telephone Encounter (Signed)
New message     Returning a nurses call to get echo results.  Pt has been out of town for a couple of days.  Patient will be in/out all day tomorrow.  OK to leave a msg on the vm

## 2013-05-26 NOTE — Telephone Encounter (Signed)
[name] at 05/25/2013 4:56 PM     Status: Signed        New message  Returning a nurses call to get echo results. Pt has been out of town for a couple of days. Patient will be in/out all day tomorrow. OK to leave a

## 2013-05-26 NOTE — Telephone Encounter (Signed)
See other telephone note.  

## 2013-05-27 ENCOUNTER — Other Ambulatory Visit: Payer: Self-pay | Admitting: General Surgery

## 2013-05-27 DIAGNOSIS — I350 Nonrheumatic aortic (valve) stenosis: Secondary | ICD-10-CM

## 2013-05-27 NOTE — Telephone Encounter (Signed)
Made pt aware of the results. ECHO ordered and put in system for pt.

## 2013-08-05 ENCOUNTER — Ambulatory Visit
Admission: RE | Admit: 2013-08-05 | Discharge: 2013-08-05 | Disposition: A | Discharge status: Home / Self Care | Payer: Commercial Managed Care - HMO | Source: Ambulatory Visit | Attending: Family Medicine | Admitting: Family Medicine

## 2013-08-05 ENCOUNTER — Other Ambulatory Visit: Payer: Self-pay | Admitting: Family Medicine

## 2013-08-05 DIAGNOSIS — M79604 Pain in right leg: Secondary | ICD-10-CM

## 2013-09-09 ENCOUNTER — Other Ambulatory Visit: Payer: Self-pay

## 2013-09-09 MED ORDER — LOSARTAN POTASSIUM 50 MG PO TABS: 100.0000 mg | ORAL_TABLET | Freq: Every day | ORAL | Status: DC

## 2013-09-12 ENCOUNTER — Other Ambulatory Visit: Payer: Self-pay | Admitting: Cardiology

## 2013-10-08 ENCOUNTER — Other Ambulatory Visit: Payer: Self-pay | Admitting: Cardiology

## 2013-10-15 ENCOUNTER — Other Ambulatory Visit: Payer: Self-pay | Admitting: Cardiology

## 2013-10-18 ENCOUNTER — Ambulatory Visit: Payer: Medicare HMO | Attending: Family Medicine | Admitting: Physical Therapy

## 2013-10-18 DIAGNOSIS — IMO0001 Reserved for inherently not codable concepts without codable children: Secondary | ICD-10-CM | POA: Diagnosis not present

## 2013-10-18 DIAGNOSIS — M6281 Muscle weakness (generalized): Secondary | ICD-10-CM | POA: Diagnosis not present

## 2013-10-18 DIAGNOSIS — R5381 Other malaise: Secondary | ICD-10-CM | POA: Insufficient documentation

## 2013-10-20 ENCOUNTER — Ambulatory Visit (INDEPENDENT_AMBULATORY_CARE_PROVIDER_SITE_OTHER): Payer: Commercial Managed Care - HMO | Admitting: Cardiology

## 2013-10-20 ENCOUNTER — Encounter: Payer: Self-pay | Admitting: Cardiology

## 2013-10-20 VITALS — BP 138/80 | HR 62 | Ht 75.0 in | Wt 242.0 lb

## 2013-10-20 DIAGNOSIS — I10 Essential (primary) hypertension: Secondary | ICD-10-CM

## 2013-10-20 DIAGNOSIS — R0602 Shortness of breath: Secondary | ICD-10-CM | POA: Insufficient documentation

## 2013-10-20 DIAGNOSIS — G4710 Hypersomnia, unspecified: Secondary | ICD-10-CM | POA: Insufficient documentation

## 2013-10-20 DIAGNOSIS — I2510 Atherosclerotic heart disease of native coronary artery without angina pectoris: Secondary | ICD-10-CM

## 2013-10-20 DIAGNOSIS — I359 Nonrheumatic aortic valve disorder, unspecified: Secondary | ICD-10-CM | POA: Diagnosis not present

## 2013-10-20 DIAGNOSIS — E7800 Pure hypercholesterolemia, unspecified: Secondary | ICD-10-CM

## 2013-10-20 DIAGNOSIS — I350 Nonrheumatic aortic (valve) stenosis: Secondary | ICD-10-CM

## 2013-10-20 MED ORDER — ROSUVASTATIN CALCIUM 20 MG PO TABS: 20.0000 mg | ORAL_TABLET | Freq: Every day | ORAL | Status: DC

## 2013-10-20 NOTE — Patient Instructions (Addendum)
Your physician has recommended you make the following change in your medication: 1. Increase Crestor to 20 MG 1 tablet daily  Your physician recommends that you return for a FASTING lipid profile and ALT in 6 weeks on 12/01/13  Your physician has requested that you have a lexiscan myoview. For further information please visit [email or web address]. Please follow instruction sheet, as given.  Your physician has recommended that you have a sleep study. This test records several body functions during sleep, including: brain activity, eye movement, oxygen and carbon dioxide blood levels, heart rate and rhythm, breathing rate and rhythm, the flow of air through your mouth and nose, snoring, body muscle movements, and chest and belly movement.  Polysomnography (Sleep Studies) Polysomnography (PSG) is a series of tests used for detecting (diagnosing) obstructive sleep apnea and other sleep disorders. The tests measure how some parts of your body are working while you are sleeping. The tests are extensive and expensive. They are done in a sleep lab or hospital, and vary from center to center. Your caregiver may perform other more simple sleep studies and questionnaires before doing more complete and involved testing. Testing may not be covered by insurance. Some of these tests are:  An EEG (Electroencephalogram). This tests your brain waves and stages of sleep.  An EOG (Electrooculogram). This measures the movements of your eyes. It detects periods of REM (rapid eye movement) sleep, which is your dream sleep.  An EKG (Electrocardiogram). This measures your heart rhythm.  EMG (Electromyography). This is a measurement of how the muscles are working in your upper airway and your legs while sleeping.  An oximetry measurement. It measures how much oxygen (air) you are getting while sleeping.  Breathing efforts may be measured. The same test can be interpreted (understood) differently by different caregivers  and centers that study sleep.  Studies may be given an apnea/hypopnea index (AHI). This is a number which is found by counting the times of no breathing or under breathing during the night, and relating those numbers to the amount of time spent in bed. When the AHI is greater than 15, the patient is likely to complain of daytime sleepiness. When the AHI is greater than 30, the patient is at increased risk for heart problems and must be followed more closely. Following the AHI also allows you to know how treatment is working. Simple oximetry (tracking the amount of oxygen that is taken in) can be used for screening patients who:  Do not have symptoms (problems) of OSA.  Have a normal Epworth Sleepiness Scale Score.  Have a low pre-test probability of having OSA.  Have none of the upper airway problems likely to cause apnea.  Oximetry is also used to determine if treatment is effective in patients who showed significant desaturations (not getting enough oxygen) on their home sleep study. One extra measure of safety is to perform additional studies for the person who only snores. This is because no one can predict with absolute certainty who will have OSA. Those who show significant desaturations (not getting enough oxygen) are recommended to have a more detailed sleep study. Document Released: 08/03/2002 Document Revised: 04/21/2011 Document Reviewed: 04/04/2013 [facility] Patient Information 2015 [place], [place]. This information is not intended to replace advice given to you by your health care provider. Make sure you discuss any questions you have with your health care provider.   Your physician wants you to follow-up in: 6 months with Dr Mallie Snooks will receive a reminder letter in the  mail two months in advance. If you don't receive a letter, please call our office to schedule the follow-up appointment.

## 2013-10-20 NOTE — Progress Notes (Signed)
[name], [place], [place]  [address] Phone: [phone] Fax:  [phone]  Date:  10/20/2013   ID:  [name], DOB Mar 08, 1933, MRN [id number]  PCP:  [name], MD  Cardiologist:  Fransico Him, MD     History of Present Illness: [name] is a 80 y.o. male with a history of ASCAD s/p remote PCI of left circ/LAD and prox PDA, HTN, dyslipidemia, mild to mod AI and moderate AS who presents today for followup. He is doing well except for severe fatigue. He denies any chest pain or pressure, dizziness, palpitations or syncope.  He has chronic DOE which his wife thinks is worse. He has chronic LE edema which is stable   Dr. Alroy Dust checked his TSH which was normal and his CBC and CMET were normal as well.  His wife says that he snores.  He feels fatigued in the am when he gets up.  He has problems falling asleep during the day.   Wt Readings from Last 3 Encounters:  10/20/13 242 lb (109.77 kg)  05/09/13 243 lb 6.4 oz (110.406 kg)  04/13/13 247 lb (112.038 kg)     Past Medical History  Diagnosis Date  . Hypertension   . Heart murmur   . BPH (benign prostatic hypertrophy)   . GERD (gastroesophageal reflux disease)   . Anxiety   . Headache(784.0)     relieved with OTC's  . Stroke     possible   . Sleep apnea     STOP BANG SCORE 5  . Dementia     early per pt- recently evaluated at [facility]- eccho7/13  , MRI head EPIC 6/13  . Bruises easily   . Unsteady gait   . Hyperlipidemia   . HBP (high blood pressure)   . Allergic rhinitis   . AI (aortic insufficiency)     Mild to Mod, Moderate AS Echo 02/2011  . Chronic fatigue   . Left carotid bruit 03/2008    w minimal obstruction on doppler  . OAB (overactive bladder)   . Dementia, vascular     Dr [name]  . Coronary artery disease     s/p PCI of left cir and LAD 2005 and PCI of prox PDA 7/09    Current Outpatient Prescriptions  Medication Sig Dispense Refill  . amLODipine (NORVASC) 5 MG tablet  TAKE 1 TABLET (5 MG TOTAL) BY MOUTH DAILY.  30 tablet  0  . aspirin 81 MG tablet Take 81 mg by mouth daily.      . carvedilol (COREG) 12.5 MG tablet Take 1 tablet (12.5 mg total) by mouth 2 (two) times daily with a meal.  180 tablet  3  . clopidogrel (PLAVIX) 75 MG tablet Take 1 tablet (75 mg total) by mouth daily.  90 tablet  3  . diclofenac (VOLTAREN) 75 MG EC tablet       . donepezil (ARICEPT) 10 MG tablet TAKE 1 TABLET BY MOUTH EVERY DAY  30 tablet  12  . finasteride (PROSCAR) 5 MG tablet Take 5 mg by mouth daily.      . furosemide (LASIX) 20 MG tablet Take 1 tablet (20 mg total) by mouth daily after breakfast.  90 tablet  3  . losartan (COZAAR) 50 MG tablet TAKE 2 TABLETS (100 MG TOTAL) BY MOUTH DAILY.  60 tablet  0  . Multiple Vitamin (MULTIVITAMIN WITH MINERALS) TABS Take 1 tablet by mouth daily.      . nitroGLYCERIN (  NITROSTAT) 0.4 MG SL tablet Place 0.4 mg under the tongue every 5 (five) minutes as needed. For chest pain      . omeprazole (PRILOSEC) 40 MG capsule Take 40 mg by mouth daily.      . potassium chloride SA (K-DUR,KLOR-CON) 20 MEQ tablet Take 1 tablet (20 mEq total) by mouth daily.  90 tablet  3  . rosuvastatin (CRESTOR) 20 MG tablet Take  1/2 tablet by mouth  daily  90 tablet  3  . sertraline (ZOLOFT) 50 MG tablet Take 50 mg by mouth daily.       . TOVIAZ 4 MG TB24 tablet Take 4 mg by mouth daily.      . traMADol (ULTRAM) 50 MG tablet Take 50 mg by mouth 2 (two) times daily before a meal. For pain in legs       No current facility-administered medications for this visit.    Allergies:    Allergies  Allergen Reactions  . Latex Rash  . Sulfa Antibiotics Hives and Rash    Social History:  The patient  reports that he quit smoking about 22 years ago. He has never used smokeless tobacco. He reports that he drinks about 1.8 ounces of alcohol per week. He reports that he does not use illicit drugs.   Family History:  The patient's family history includes Lung cancer in his  father. There is no history of Anesthesia problems.   ROS:  Please see the history of present illness.      All other systems reviewed and negative.   PHYSICAL EXAM: VS:  BP 138/80  Pulse 62  Ht 6\' 3"  (1.905 m)  Wt 242 lb (109.77 kg)  BMI 30.25 kg/m2 Well nourished, well developed, in no acute distress HEENT: normal Neck: no JVD Cardiac:  normal S1, S2; RRR; no murmur Lungs:  clear to auscultation bilaterally, no wheezing, rhonchi or rales Abd: soft, nontender, no hepatomegaly Ext: no edema Skin: warm and dry Neuro:  CNs 2-12 intact, no focal abnormalities noted  ASSESSMENT AND PLAN:  1. ASCAD - he is not having any chest pain but he is having worsening SOB and severe exertional fatigue - continue ASA/Plavix  - continue Lexiscan myoview to rule out ischemia 2. HTN - controlled - continue Cozaar/Coreg/amlodipine  3. Dyslipidemia - LDL not at goal on last lipid panel 6/15 which was 89 (goal<70) - Increase Crestor to 20mg  daily - check fasting lipids and ALT in 6 weeks 4. Mild AI by echo 3/15 5. Moderate AS - stable - no change from prior echo - will repeat echo in 6 months to follow for progression       6. Chronic LE edema - stable  - continue Lasix        7.  Hypersomnia with severe fatigue - I will get a split night PSG to rule out OSA  Followup with me in 6 months      Signed, Fransico Him, MD 10/20/2013 9:23 AM

## 2013-10-21 ENCOUNTER — Ambulatory Visit: Payer: Medicare HMO | Admitting: Physical Therapy

## 2013-10-21 DIAGNOSIS — IMO0001 Reserved for inherently not codable concepts without codable children: Secondary | ICD-10-CM | POA: Diagnosis not present

## 2013-10-27 ENCOUNTER — Ambulatory Visit: Payer: Medicare HMO | Admitting: Physical Therapy

## 2013-10-27 DIAGNOSIS — IMO0001 Reserved for inherently not codable concepts without codable children: Secondary | ICD-10-CM | POA: Diagnosis not present

## 2013-10-28 ENCOUNTER — Ambulatory Visit: Payer: Medicare HMO | Admitting: Physical Therapy

## 2013-10-28 DIAGNOSIS — IMO0001 Reserved for inherently not codable concepts without codable children: Secondary | ICD-10-CM | POA: Diagnosis not present

## 2013-11-01 ENCOUNTER — Ambulatory Visit: Payer: Medicare HMO | Admitting: Physical Therapy

## 2013-11-01 DIAGNOSIS — IMO0001 Reserved for inherently not codable concepts without codable children: Secondary | ICD-10-CM | POA: Diagnosis not present

## 2013-11-03 ENCOUNTER — Ambulatory Visit (HOSPITAL_COMMUNITY): Payer: Medicare HMO | Attending: Internal Medicine | Admitting: Radiology

## 2013-11-03 VITALS — BP 154/91 | HR 58 | Ht 75.0 in | Wt 248.0 lb

## 2013-11-03 DIAGNOSIS — I10 Essential (primary) hypertension: Secondary | ICD-10-CM | POA: Insufficient documentation

## 2013-11-03 DIAGNOSIS — R0602 Shortness of breath: Secondary | ICD-10-CM

## 2013-11-03 DIAGNOSIS — R0609 Other forms of dyspnea: Secondary | ICD-10-CM | POA: Insufficient documentation

## 2013-11-03 DIAGNOSIS — R0989 Other specified symptoms and signs involving the circulatory and respiratory systems: Principal | ICD-10-CM | POA: Insufficient documentation

## 2013-11-03 DIAGNOSIS — I2510 Atherosclerotic heart disease of native coronary artery without angina pectoris: Secondary | ICD-10-CM | POA: Insufficient documentation

## 2013-11-03 DIAGNOSIS — R5383 Other fatigue: Secondary | ICD-10-CM

## 2013-11-03 DIAGNOSIS — R5381 Other malaise: Secondary | ICD-10-CM | POA: Diagnosis not present

## 2013-11-03 MED ORDER — REGADENOSON 0.4 MG/5ML IV SOLN
0.4000 mg | Freq: Once | INTRAVENOUS | Status: AC
  Administered 2013-11-03: 0.4 mg via INTRAVENOUS

## 2013-11-03 MED ORDER — TECHNETIUM TC 99M SESTAMIBI GENERIC - CARDIOLITE
10.0000 | Freq: Once | INTRAVENOUS | Status: AC | PRN
  Administered 2013-11-03: 10 via INTRAVENOUS

## 2013-11-03 MED ORDER — TECHNETIUM TC 99M SESTAMIBI GENERIC - CARDIOLITE
30.0000 | Freq: Once | INTRAVENOUS | Status: AC | PRN
  Administered 2013-11-03: 30 via INTRAVENOUS

## 2013-11-03 NOTE — Progress Notes (Signed)
[facility] [address] [place], [place] [address] [phone]    Cardiology Nuclear Med Study  [name] is a 80 y.o. male     MRN : [id number]     DOB: 1933-08-03  Procedure Date: 11/03/2013  Nuclear Med Background Indication for Stress Test:  Evaluation for Ischemia and Follow up CAD History:  CAD, MPI (normal) EF 63% Cardiac Risk Factors: Hypertension  Symptoms:  DOE and Fatigue   Nuclear Pre-Procedure Caffeine/Decaff Intake:  None NPO After: 3 pm   Lungs:  clear O2 Sat: 96% on room air. IV 0.9% NS with Angio Cath:  22g  IV Site: R Antecubital  IV Started by:  Crissie Figures, RN  Chest Size (in):  48 Cup Size: n/a  Height: 6\' 3"  (1.905 m)  Weight:  248 lb (112.492 kg)  BMI:  Body mass index is 31 kg/(m^2). Tech Comments:  N/A    Nuclear Med Study 1 or 2 day study: 1 day  Stress Test Type:  Lexiscan  Reading MD: N/A  Order Authorizing Provider:  Fransico Him, MD  Resting Radionuclide: Technetium 54m Sestamibi  Resting Radionuclide Dose: 11.0 mCi   Stress Radionuclide:  Technetium 50m Sestamibi  Stress Radionuclide Dose: 33.0 mCi           Stress Protocol Rest HR: 58 Stress HR: 78  Rest BP: 154/91 Stress BP: 142/82  Exercise Time (min): n/a METS: n/a           Dose of Adenosine (mg):  n/a Dose of Lexiscan: 0.4 mg  Dose of Atropine (mg): n/a Dose of Dobutamine: n/a mcg/kg/min (at max HR)  Stress Test Technologist: Glade Lloyd, BS-ES  Nuclear Technologist:  Earl Many, CNMT     Rest Procedure:  Myocardial perfusion imaging was performed at rest 45 minutes following the intravenous administration of Technetium 23m Sestamibi. Rest ECG: NSR - Normal EKG  Stress Procedure:  The patient received IV Lexiscan 0.4 mg over 15-seconds.  Technetium 58m Sestamibi injected at 30-seconds.  Quantitative spect images were obtained after a 45 minute delay.  During the infusion of Lexiscan the patient complained of SOB, cough and feeling  warm.  These symptoms resolved in recovery.  Stress ECG: No significant change from baseline ECG  QPS Raw Data Images:  Mild diaphragmatic attenuation.  Normal left ventricular size. Stress Images:  There is decreased uptake in the basal and mid inferior wall. Rest Images:  There is decreased uptake in the mid and basal inferior wall. Subtraction (SDS):  No evidence of ischemia. Transient Ischemic Dilatation (Normal <1.22):  1.03 Lung/Heart Ratio (Normal <0.45):  0.35  Quantitative Gated Spect Images QGS EDV:  94 ml QGS ESV:  43 ml  Impression Exercise Capacity:  Lexiscan with no exercise. BP Response:  Normal blood pressure response. Clinical Symptoms:  There is dyspnea. ECG Impression:  No significant ST segment change suggestive of ischemia. Comparison with Prior Nuclear Study: No images to compare  Overall Impression:  Low risk stress nuclear study with a fixed defect in the mid and basal inferior wall consistent with diaphragmatic attenuation.  No inducible ischemia..  LV Ejection Fraction: 54%.  LV Wall Motion:  NL LV Function; NL Wall Motion   Signed: Fransico Him, MD [facility]

## 2013-11-10 ENCOUNTER — Other Ambulatory Visit: Payer: Self-pay | Admitting: General Surgery

## 2013-11-10 ENCOUNTER — Encounter: Payer: Commercial Managed Care - HMO | Admitting: Physical Therapy

## 2013-11-10 DIAGNOSIS — R0602 Shortness of breath: Secondary | ICD-10-CM

## 2013-11-11 ENCOUNTER — Ambulatory Visit: Payer: Medicare HMO | Attending: Family Medicine | Admitting: Physical Therapy

## 2013-11-11 DIAGNOSIS — R5381 Other malaise: Secondary | ICD-10-CM | POA: Diagnosis not present

## 2013-11-11 DIAGNOSIS — Z5189 Encounter for other specified aftercare: Secondary | ICD-10-CM | POA: Diagnosis not present

## 2013-11-11 DIAGNOSIS — M6281 Muscle weakness (generalized): Secondary | ICD-10-CM | POA: Diagnosis not present

## 2013-11-17 ENCOUNTER — Ambulatory Visit: Payer: Medicare HMO | Admitting: Physical Therapy

## 2013-11-17 ENCOUNTER — Other Ambulatory Visit: Payer: Self-pay | Admitting: Cardiology

## 2013-11-17 DIAGNOSIS — Z5189 Encounter for other specified aftercare: Secondary | ICD-10-CM | POA: Diagnosis not present

## 2013-11-24 ENCOUNTER — Encounter: Payer: Commercial Managed Care - HMO | Admitting: Physical Therapy

## 2013-12-01 ENCOUNTER — Other Ambulatory Visit (HOSPITAL_COMMUNITY): Payer: Commercial Managed Care - HMO

## 2013-12-01 ENCOUNTER — Other Ambulatory Visit: Payer: Commercial Managed Care - HMO

## 2013-12-01 ENCOUNTER — Ambulatory Visit (HOSPITAL_COMMUNITY): Payer: Commercial Managed Care - HMO | Attending: Cardiology | Admitting: Radiology

## 2013-12-01 ENCOUNTER — Other Ambulatory Visit (INDEPENDENT_AMBULATORY_CARE_PROVIDER_SITE_OTHER): Payer: Commercial Managed Care - HMO | Admitting: *Deleted

## 2013-12-01 DIAGNOSIS — R0602 Shortness of breath: Secondary | ICD-10-CM | POA: Insufficient documentation

## 2013-12-01 DIAGNOSIS — E785 Hyperlipidemia, unspecified: Secondary | ICD-10-CM | POA: Insufficient documentation

## 2013-12-01 DIAGNOSIS — I25119 Atherosclerotic heart disease of native coronary artery with unspecified angina pectoris: Secondary | ICD-10-CM

## 2013-12-01 DIAGNOSIS — I350 Nonrheumatic aortic (valve) stenosis: Secondary | ICD-10-CM | POA: Insufficient documentation

## 2013-12-01 DIAGNOSIS — I10 Essential (primary) hypertension: Secondary | ICD-10-CM | POA: Insufficient documentation

## 2013-12-01 DIAGNOSIS — E7800 Pure hypercholesterolemia, unspecified: Secondary | ICD-10-CM

## 2013-12-01 DIAGNOSIS — E669 Obesity, unspecified: Secondary | ICD-10-CM | POA: Diagnosis not present

## 2013-12-01 LAB — LIPID PANEL
Cholesterol: 154 mg/dL (ref 0–200)
HDL: 46.6 mg/dL (ref >39.00)
LDL Cholesterol: 88 mg/dL (ref 0–99)
NONHDL: 107.4
Total CHOL/HDL Ratio: 3
Triglycerides: 98 mg/dL (ref 0.0–149.0)
VLDL: 19.6 mg/dL (ref 0.0–40.0)

## 2013-12-01 LAB — ALT: ALT: 30 U/L (ref 0–53)

## 2013-12-01 LAB — BRAIN NATRIURETIC PEPTIDE: Pro B Natriuretic peptide (BNP): 79 pg/mL (ref 0.0–100.0)

## 2013-12-01 NOTE — Progress Notes (Signed)
Echocardiogram performed.  

## 2013-12-06 ENCOUNTER — Ambulatory Visit: Payer: Medicare HMO | Admitting: Physical Therapy

## 2013-12-06 DIAGNOSIS — Z5189 Encounter for other specified aftercare: Secondary | ICD-10-CM | POA: Diagnosis not present

## 2013-12-07 ENCOUNTER — Telehealth: Payer: Self-pay

## 2013-12-07 ENCOUNTER — Ambulatory Visit: Payer: Medicare HMO | Admitting: Physical Therapy

## 2013-12-07 ENCOUNTER — Other Ambulatory Visit: Payer: Self-pay

## 2013-12-07 DIAGNOSIS — Z5189 Encounter for other specified aftercare: Secondary | ICD-10-CM | POA: Diagnosis not present

## 2013-12-07 DIAGNOSIS — E7800 Pure hypercholesterolemia, unspecified: Secondary | ICD-10-CM

## 2013-12-07 MED ORDER — ROSUVASTATIN CALCIUM 20 MG PO TABS: 40.0000 mg | ORAL_TABLET | Freq: Every day | ORAL | Status: DC

## 2013-12-07 NOTE — Telephone Encounter (Signed)
Patient informed of lab results and new orders from Dr. Radford Pax. Instructed patient to take 40 mg Crestor daily.  Lab appointment made for 12/4 for fasting labs. Patient agrees with treatment plan.

## 2013-12-07 NOTE — Telephone Encounter (Signed)
Message copied by Theodoro Parma on Wed Dec 07, 2013  5:25 PM ------      Message from: Fransico Him R      Created: Thu Dec 01, 2013  5:22 PM       BNP is normal.  LDL is not at  Goal.  Please have patient increase Crestor to 40 mg daily and recheck FLP and ALT in 6 weeks ------

## 2013-12-08 ENCOUNTER — Ambulatory Visit (HOSPITAL_BASED_OUTPATIENT_CLINIC_OR_DEPARTMENT_OTHER): Payer: Medicare HMO | Attending: Cardiology

## 2013-12-08 DIAGNOSIS — G4710 Hypersomnia, unspecified: Secondary | ICD-10-CM | POA: Diagnosis present

## 2013-12-08 DIAGNOSIS — G4761 Periodic limb movement disorder: Secondary | ICD-10-CM | POA: Diagnosis not present

## 2013-12-08 DIAGNOSIS — R0683 Snoring: Secondary | ICD-10-CM | POA: Diagnosis present

## 2013-12-08 DIAGNOSIS — G4730 Sleep apnea, unspecified: Secondary | ICD-10-CM

## 2013-12-12 ENCOUNTER — Telehealth: Payer: Self-pay | Admitting: Cardiology

## 2013-12-12 NOTE — Sleep Study (Signed)
   NAME: [name] DATE OF BIRTH:  1933-08-31 MEDICAL RECORD NUMBER [id number]  LOCATION: [facility]  PHYSICIAN: Heather Streeper R  DATE OF STUDY: 12/08/2013  SLEEP STUDY TYPE: Nocturnal Polysomnogram               REFERRING PHYSICIAN: Sueanne Margarita, MD  INDICATION FOR STUDY: daytime hypersomina, snoring, increased leg movements, frequent awakenings from sleep  EPWORTH SLEEPINESS SCORE: 14 HEIGHT: 6'3" WEIGHT: 243lbs NECK SIZE: 17.5 in. BMI: 30  MEDICATIONS: Reviewed in the chart  SLEEP ARCHITECTURE: The patient slept for a total of 254 minutes with no slow wave sleep.  REM sleep was reduced at 23 minuets.  The onset to sleep latency was prolonged at 46 minutes and onset to REM latency was prolonged at 143 minutes.  The sleep efficiency was reduced at 68%.    RESPIRATORY DATA: There was 1 obstructive apnea and 15 hypopneas during the study.  Most occurred in the supine position during REM sleep.  The overall AHI was 3.8 events per hour.  The RDI was 5.2 events per hour.  He was noted to have mild snoring.  OXYGEN DATA: The O2 sat nadir during REM sleep was 86% and during NREM sleep it was 90%.  Total time with O2 sats <88% was 3.8 minutes.  CARDIAC DATA: The patient maintained NSR with occasional PVC's during the study.  MOVEMENT/PARASOMNIA: There were increased periodic limb movements with a PMLS index markedly increased at 117.2 events per hour.    IMPRESSION/ RECOMMENDATION:   1.  There was no significant obstructive sleep apnea/hypopnea syndrome noted during the PSG and therefore the patient does not qualify for CPAP therapy. 2.  Increased periodic limb movement disorder 3.  Reduced REM sleep and increased latency to REM onset may be due to SSRI therapy.  May need to consider changing to a different antidepressant therapy. 4.  The patient should be counseled on good sleep hygiene and weight loss.  Signed: Sueanne Margarita Diplomate, American Board  of Sleep Medicine  ELECTRONICALLY SIGNED ON:  12/12/2013, 5:35 PM [place] PH: [phone]   FX: [phone] [place]

## 2013-12-12 NOTE — Telephone Encounter (Signed)
Please let patient know that he does not have sleep apnea.  He does have reduced REM sleep that can cause daytime sleepiness and this may be due to the Zoloft he is taking. Please forward a copy of the sleep study to his PCP to consider changing antidepressants.  He has increased periodic limb movements during sleep causing arousals as well.  Please find out if he has problems with restless legs.

## 2013-12-13 ENCOUNTER — Telehealth: Payer: Self-pay | Admitting: *Deleted

## 2013-12-13 NOTE — Telephone Encounter (Signed)
[facility] [place] requests donepezil refill for patient. Please advise. Thanks, MI

## 2013-12-14 ENCOUNTER — Other Ambulatory Visit: Payer: Self-pay | Admitting: Cardiology

## 2013-12-15 ENCOUNTER — Ambulatory Visit: Payer: Commercial Managed Care - HMO | Attending: Family Medicine | Admitting: Physical Therapy

## 2013-12-15 DIAGNOSIS — M6281 Muscle weakness (generalized): Secondary | ICD-10-CM | POA: Insufficient documentation

## 2013-12-15 DIAGNOSIS — Z5189 Encounter for other specified aftercare: Secondary | ICD-10-CM | POA: Insufficient documentation

## 2013-12-15 DIAGNOSIS — R5381 Other malaise: Secondary | ICD-10-CM | POA: Diagnosis not present

## 2013-12-15 NOTE — Telephone Encounter (Signed)
PCP?

## 2013-12-15 NOTE — Telephone Encounter (Signed)
Informed patient he does not have sleep apnea and that his daytime sleepiness may be due to the Zoloft he is taking. Informed patient a copy of his sleep study will be forwarded to his PCP to consider changing antidepressants.  Pt st that his legs hurt all the time for over 2-3 years. He st that doctors tell him "it's all in his mind." He st that his legs do not feel "restless," just painful. Patient st the leg pain is better when he lies down.

## 2013-12-15 NOTE — Telephone Encounter (Signed)
Left message with wife to call back

## 2013-12-16 ENCOUNTER — Other Ambulatory Visit: Payer: Self-pay | Admitting: Cardiology

## 2013-12-16 ENCOUNTER — Telehealth: Payer: Self-pay | Admitting: Cardiology

## 2013-12-16 DIAGNOSIS — E7800 Pure hypercholesterolemia, unspecified: Secondary | ICD-10-CM

## 2013-12-16 NOTE — Telephone Encounter (Signed)
Follow Up        Pt's wife calling back also stating that since pt's Crestor has been increased from 10mg  to 40mg  it is really affecting the pt's legs. Pt can hardly walk due to the pain in his legs. Please call back and advise.

## 2013-12-16 NOTE — Telephone Encounter (Signed)
New Message       Pt's wife calling stating she was told to call Valetta Fuller today in regards to results from a sleep study pt had. Please call back and advise.

## 2013-12-16 NOTE — Telephone Encounter (Signed)
I spoke with pt wife who states since pt Crestor was increased from 10mg  to 40mg  within the last 2 weeks his leg discomfort has become unbearable making it diffcult for him to move around. Wife states that she has been giving him some "of my hydrocodone left over from my surgery" Advised wife to not give pt her medication  Advised wife to not give pt her pain medication, decrease crestor back to 10 mg ( bc he could not tolerate 20mg  in the past), use a heating pad for the soreness & extra strength tylenol as directed.  She agrees with this plan & will call our office next week with an update on leg discomfort & if there needs to be a medication change to decrease the LDL further. She states Crestor is the only statin the pt has been on. Reassurance given.  Horton Chin RN

## 2013-12-19 NOTE — Telephone Encounter (Signed)
lmtcb

## 2013-12-19 NOTE — Telephone Encounter (Signed)
Please set him up an appt with lipid clinic

## 2013-12-20 NOTE — Telephone Encounter (Signed)
Routing to PCP for Aricept refills.

## 2013-12-20 NOTE — Telephone Encounter (Signed)
Left message to call back  

## 2013-12-21 ENCOUNTER — Encounter: Payer: Commercial Managed Care - HMO | Admitting: Physical Therapy

## 2013-12-22 ENCOUNTER — Ambulatory Visit: Payer: Commercial Managed Care - HMO | Admitting: Physical Therapy

## 2013-12-22 DIAGNOSIS — Z5189 Encounter for other specified aftercare: Secondary | ICD-10-CM | POA: Diagnosis not present

## 2013-12-23 ENCOUNTER — Ambulatory Visit: Payer: Commercial Managed Care - HMO | Admitting: Physical Therapy

## 2013-12-23 DIAGNOSIS — Z5189 Encounter for other specified aftercare: Secondary | ICD-10-CM | POA: Diagnosis not present

## 2013-12-28 NOTE — Telephone Encounter (Signed)
Pt st he is feeling much better since he is only taking 10 mg Crestor now.  He has not taken any more pain medication. Informed patient that Dr. Radford Pax recommends setting up an appointment with the [facility]. Patient agrees with treatment plan.

## 2013-12-28 NOTE — Addendum Note (Signed)
Addended by: Harland German A on: 12/28/2013 11:19 AM   Modules accepted: Orders, Medications

## 2014-01-09 ENCOUNTER — Telehealth: Payer: Self-pay | Admitting: Cardiology

## 2014-01-09 NOTE — Telephone Encounter (Signed)
Please let Dr. Gladstone Lighter know that it is ok for patient to be off Plavix for 5 day for surgery without a Lovenox bridge

## 2014-01-09 NOTE — Telephone Encounter (Signed)
Please let Dr. Gladstone Lighter know that patient is stable from a cardiac standpoint for surgery.  He had a recent normal nuclear stress test and has normal LVF with moderate aortic stenosis.  Need to avoid hypotension during surgery and avoid fluid overload with history of diastolic dysfunction and moderate AS>

## 2014-01-09 NOTE — Telephone Encounter (Signed)
Printed and placed in medical records "to be faxed" bin. 

## 2014-01-11 ENCOUNTER — Other Ambulatory Visit: Payer: Self-pay | Admitting: Cardiology

## 2014-01-12 ENCOUNTER — Ambulatory Visit: Payer: Commercial Managed Care - HMO | Admitting: Pharmacist

## 2014-01-13 ENCOUNTER — Other Ambulatory Visit: Payer: Self-pay | Admitting: Surgical

## 2014-01-13 ENCOUNTER — Other Ambulatory Visit (INDEPENDENT_AMBULATORY_CARE_PROVIDER_SITE_OTHER): Payer: Commercial Managed Care - HMO | Admitting: *Deleted

## 2014-01-13 ENCOUNTER — Telehealth: Payer: Self-pay | Admitting: Cardiology

## 2014-01-13 DIAGNOSIS — E7800 Pure hypercholesterolemia, unspecified: Secondary | ICD-10-CM

## 2014-01-13 NOTE — Telephone Encounter (Signed)
Walk in Pt Form " Pt has Questions about Meds" Please Call gave to [place]/[facility]

## 2014-01-15 ENCOUNTER — Other Ambulatory Visit: Payer: Self-pay | Admitting: Cardiology

## 2014-01-15 LAB — LIPID PANEL
Cholesterol: 148 mg/dL (ref 0–200)
HDL: 45.2 mg/dL (ref >39.00)
LDL Cholesterol: 80 mg/dL (ref 0–99)
NonHDL: 102.8
Total CHOL/HDL Ratio: 3
Triglycerides: 112 mg/dL (ref 0.0–149.0)
VLDL: 22.4 mg/dL (ref 0.0–40.0)

## 2014-01-15 LAB — ALT: ALT: 36 U/L (ref 0–53)

## 2014-01-18 ENCOUNTER — Telehealth: Payer: Self-pay

## 2014-01-18 NOTE — Telephone Encounter (Signed)
Patient walked in to office 12/4 angry about Lipid Clinic appointment.   Received walk-in form yesterday. Left message for the patient to call back to discuss concerns and see what happened.

## 2014-01-20 NOTE — Telephone Encounter (Signed)
Gay Filler, Can you help with this. I spoke with the patient's wife this morning. There was some confusion about his lipid clinic appointment. This was scheduled for 01/12/14. The patient's wife states she has a message still on her phone that the patient was to come on 01/13/14 for an appointment. They stated they sat here for 2 hours on Friday and were finally told they had to have lab work first. I'm really unclear about where that originated. However, he ended up with a repeat lab and no appointment with the lipid clinic. The patient was unavailable to speak with me, but his wife was fully aware of what had happened. Can you touch base with the patient and his wife? I offered another appointment in the lipid clinic but she did not want to make that at this time. I advised I would forward to you to review.

## 2014-01-20 NOTE — Telephone Encounter (Signed)
Spoke with pt. Apologized for confusion last week.  Discussed lab results.  LDL improved from 88 to 80 with Crestor 10mg  daily.  He is tolerating okay with just occassional leg pains.  He will continue this dose and follow up with Dr. Radford Pax as needed.  Not necessary to see the lipid clinic at this time.

## 2014-02-07 ENCOUNTER — Ambulatory Visit
Admission: RE | Admit: 2014-02-07 | Discharge: 2014-02-07 | Disposition: A | Discharge status: Home / Self Care | Payer: Commercial Managed Care - HMO | Source: Ambulatory Visit | Attending: Family Medicine | Admitting: Family Medicine

## 2014-02-07 ENCOUNTER — Other Ambulatory Visit: Payer: Self-pay | Admitting: Family Medicine

## 2014-02-07 ENCOUNTER — Encounter (HOSPITAL_COMMUNITY): Payer: Self-pay | Admitting: *Deleted

## 2014-02-07 DIAGNOSIS — R059 Cough, unspecified: Secondary | ICD-10-CM

## 2014-02-07 DIAGNOSIS — R05 Cough: Secondary | ICD-10-CM

## 2014-02-14 ENCOUNTER — Encounter (HOSPITAL_COMMUNITY)
Admission: RE | Admit: 2014-02-14 | Discharge: 2014-02-14 | Disposition: A | Discharge status: Home / Self Care | Payer: Commercial Managed Care - HMO | Source: Ambulatory Visit | Attending: Orthopedic Surgery | Admitting: Orthopedic Surgery

## 2014-02-14 ENCOUNTER — Ambulatory Visit (HOSPITAL_COMMUNITY)
Admission: RE | Admit: 2014-02-14 | Discharge: 2014-02-14 | Disposition: A | Discharge status: Home / Self Care | Payer: Commercial Managed Care - HMO | Source: Ambulatory Visit | Attending: Surgical | Admitting: Surgical

## 2014-02-14 ENCOUNTER — Encounter (HOSPITAL_COMMUNITY): Payer: Self-pay

## 2014-02-14 DIAGNOSIS — Z01818 Encounter for other preprocedural examination: Secondary | ICD-10-CM | POA: Insufficient documentation

## 2014-02-14 DIAGNOSIS — Z87891 Personal history of nicotine dependence: Secondary | ICD-10-CM | POA: Insufficient documentation

## 2014-02-14 DIAGNOSIS — I517 Cardiomegaly: Secondary | ICD-10-CM | POA: Diagnosis not present

## 2014-02-14 HISTORY — DX: Unspecified osteoarthritis, unspecified site: M19.90

## 2014-02-14 LAB — CBC WITH DIFFERENTIAL/PLATELET
Basophils Absolute: 0 10*3/uL (ref 0.0–0.1)
Basophils Relative: 0 % (ref 0–1)
Eosinophils Absolute: 0.2 10*3/uL (ref 0.0–0.7)
Eosinophils Relative: 3 % (ref 0–5)
HCT: 46.2 % (ref 39.0–52.0)
Hemoglobin: 15.8 g/dL (ref 13.0–17.0)
Lymphocytes Relative: 19 % (ref 12–46)
Lymphs Abs: 1.1 10*3/uL (ref 0.7–4.0)
MCH: 31.7 pg (ref 26.0–34.0)
MCHC: 34.2 g/dL (ref 30.0–36.0)
MCV: 92.8 fL (ref 78.0–100.0)
Monocytes Absolute: 0.4 10*3/uL (ref 0.1–1.0)
Monocytes Relative: 7 % (ref 3–12)
Neutro Abs: 4.1 10*3/uL (ref 1.7–7.7)
Neutrophils Relative %: 71 % (ref 43–77)
Platelets: 210 10*3/uL (ref 150–400)
RBC: 4.98 MIL/uL (ref 4.22–5.81)
RDW: 13.5 % (ref 11.5–15.5)
WBC: 5.8 10*3/uL (ref 4.0–10.5)

## 2014-02-14 LAB — SURGICAL PCR SCREEN
MRSA, PCR: NEGATIVE
STAPHYLOCOCCUS AUREUS: NEGATIVE

## 2014-02-14 LAB — COMPREHENSIVE METABOLIC PANEL
ALT: 40 U/L (ref 0–53)
AST: 31 U/L (ref 0–37)
Albumin: 4 g/dL (ref 3.5–5.2)
Alkaline Phosphatase: 117 U/L (ref 39–117)
Anion gap: 6 (ref 5–15)
BUN: 19 mg/dL (ref 6–23)
CO2: 24 mmol/L (ref 19–32)
Calcium: 8.7 mg/dL (ref 8.4–10.5)
Chloride: 111 mEq/L (ref 96–112)
Creatinine, Ser: 0.9 mg/dL (ref 0.50–1.35)
GFR calc Af Amer: >90 mL/min (ref >90)
GFR calc non Af Amer: 79 mL/min — ABNORMAL LOW (ref >90)
Glucose, Bld: 113 mg/dL — ABNORMAL HIGH (ref 70–99)
Potassium: 3.8 mmol/L (ref 3.5–5.1)
Sodium: 141 mmol/L (ref 135–145)
Total Bilirubin: 1.3 mg/dL — ABNORMAL HIGH (ref 0.3–1.2)
Total Protein: 6.7 g/dL (ref 6.0–8.3)

## 2014-02-14 LAB — URINALYSIS, ROUTINE W REFLEX MICROSCOPIC
Bilirubin Urine: NEGATIVE
Glucose, UA: NEGATIVE mg/dL
Hgb urine dipstick: NEGATIVE
Ketones, ur: NEGATIVE mg/dL
Leukocytes, UA: NEGATIVE
Nitrite: NEGATIVE
Protein, ur: NEGATIVE mg/dL
Specific Gravity, Urine: 1.029 (ref 1.005–1.030)
Urobilinogen, UA: 1 mg/dL (ref 0.0–1.0)
pH: 6 (ref 5.0–8.0)

## 2014-02-14 LAB — ABO/RH: ABO/RH(D): O POS

## 2014-02-14 LAB — PROTIME-INR
INR: 1 (ref 0.00–1.49)
Prothrombin Time: 13.3 seconds (ref 11.6–15.2)

## 2014-02-14 LAB — APTT: aPTT: 30 seconds (ref 24–37)

## 2014-02-14 NOTE — Patient Instructions (Addendum)
[facility]  02/14/2014   Your procedure is scheduled on:     Tuesday, January 12,2016  Report to [facility] Main Entrance and follow signs to  [place] arrive at [place] AM.  Call this number if you have problems the morning of surgery [phone] or Presurgical Testing [phone].   Remember:  Do not eat food or drink liquids :After Midnight.       Take these medicines the morning of surgery with A SIP OF WATER: Amlodipine,Coreg(Carvedilol),Proscar (Finasteride),Prilosec (Omeprazole), Zoloft (Sertraline)                               You may not have any metal on your body including hair pins and piercings  Do not wear jewelry,lotions, powders,colognes or deodorant.  Men may shave face and neck.               Do not bring valuables to the hospital. [place].  Contacts, dentures or bridgework may not be worn into surgery.  Leave suitcase in the car. After surgery it may be brought to your room.  For patients admitted to the hospital, checkout time is 11:00 AM the day of discharge.   Special Instructions: review fact sheets for MRSA information, Blood Transfusion fact sheet, Incentive Spirometry.  Remember: Type/Screen "Blue armsbands"- may not be removed once applied(would result in being retested AM of surgery, if removed). ________________________________________________________________________  [facility] - Preparing for Surgery Before surgery, you can play an important role.  Because skin is not sterile, your skin needs to be as free of germs as possible.  You can reduce the number of germs on your skin by washing with CHG (chlorahexidine gluconate) soap before surgery.  CHG is an antiseptic cleaner which kills germs and bonds with the skin to continue killing germs even after washing. Please DO NOT use if you have an allergy to CHG or antibacterial soaps.  If your skin becomes reddened/irritated stop using the CHG and  inform your nurse when you arrive at Short Stay. Do not shave (including legs and underarms) for at least 48 hours prior to the first CHG shower.  You may shave your face/neck. Please follow these instructions carefully:  1.  Shower with CHG Soap the night before surgery and the  morning of Surgery.  2.  If you choose to wash your hair, wash your hair first as usual with your  normal  shampoo.  3.  After you shampoo, rinse your hair and body thoroughly to remove the  shampoo.                           4.  Use CHG as you would any other liquid soap.  You can apply chg directly  to the skin and wash                       Gently with a scrungie or clean washcloth.  5.  Apply the CHG Soap to your body ONLY FROM THE NECK DOWN.   Do not use on face/ open                           Wound or open sores. Avoid contact with eyes, ears mouth and genitals (private parts).  Wash face,  Genitals (private parts) with your normal soap.             6.  Wash thoroughly, paying special attention to the area where your surgery  will be performed.  7.  Thoroughly rinse your body with warm water from the neck down.  8.  DO NOT shower/wash with your normal soap after using and rinsing off  the CHG Soap.                9.  Pat yourself dry with a clean towel.            10.  Wear clean pajamas.            11.  Place clean sheets on your bed the night of your first shower and do not  sleep with pets. Day of Surgery : Do not apply any lotions/deodorants the morning of surgery.  Please wear clean clothes to the hospital/surgery center.  FAILURE TO FOLLOW THESE INSTRUCTIONS MAY RESULT IN THE CANCELLATION OF YOUR SURGERY PATIENT SIGNATURE_________________________________  NURSE SIGNATURE__________________________________  ________________________________________________________________________   [name]  An incentive spirometer is a tool that can help keep your lungs clear and  active. This tool measures how well you are filling your lungs with each breath. Taking long deep breaths may help reverse or decrease the chance of developing breathing (pulmonary) problems (especially infection) following:  A long period of time when you are unable to move or be active. BEFORE THE PROCEDURE   If the spirometer includes an indicator to show your best effort, your nurse or respiratory therapist will set it to a desired goal.  If possible, sit up straight or lean slightly forward. Try not to slouch.  Hold the incentive spirometer in an upright position. INSTRUCTIONS FOR USE   Sit on the edge of your bed if possible, or sit up as far as you can in bed or on a chair.  Hold the incentive spirometer in an upright position.  Breathe out normally.  Place the mouthpiece in your mouth and seal your lips tightly around it.  Breathe in slowly and as deeply as possible, raising the piston or the ball toward the top of the column.  Hold your breath for 3-5 seconds or for as long as possible. Allow the piston or ball to fall to the bottom of the column.  Remove the mouthpiece from your mouth and breathe out normally.  Rest for a few seconds and repeat Steps 1 through 7 at least 10 times every 1-2 hours when you are awake. Take your time and take a few normal breaths between deep breaths.  The spirometer may include an indicator to show your best effort. Use the indicator as a goal to work toward during each repetition.  After each set of 10 deep breaths, practice coughing to be sure your lungs are clear. If you have an incision (the cut made at the time of surgery), support your incision when coughing by placing a pillow or rolled up towels firmly against it. Once you are able to get out of bed, walk around indoors and cough well. You may stop using the incentive spirometer when instructed by your caregiver.  RISKS AND COMPLICATIONS  Take your time so you do not get dizzy or  light-headed.  If you are in pain, you may need to take or ask for pain medication before doing incentive spirometry. It is harder to take a deep breath if you are having  pain. AFTER USE  Rest and breathe slowly and easily.  It can be helpful to keep track of a log of your progress. Your caregiver can provide you with a simple table to help with this. If you are using the spirometer at home, follow these instructions: [place] IF:   You are having difficultly using the spirometer.  You have trouble using the spirometer as often as instructed.  Your pain medication is not giving enough relief while using the spirometer.  You develop fever of 100.5 F (38.1 C) or higher. SEEK IMMEDIATE MEDICAL CARE IF:   You cough up bloody sputum that had not been present before.  You develop fever of 102 F (38.9 C) or greater.  You develop worsening pain at or near the incision site. MAKE SURE YOU:   Understand these instructions.  Will watch your condition.  Will get help right away if you are not doing well or get worse. Document Released: 06/09/2006 Document Revised: 04/21/2011 Document Reviewed: 08/10/2006 [employer] Patient Information 2014 [employer], [place].   ________________________________________________________________________  WHAT IS A BLOOD TRANSFUSION? Blood Transfusion Information  A transfusion is the replacement of blood or some of its parts. Blood is made up of multiple cells which provide different functions.  Red blood cells carry oxygen and are used for blood loss replacement.  White blood cells fight against infection.  Platelets control bleeding.  Plasma helps clot blood.  Other blood products are available for specialized needs, such as hemophilia or other clotting disorders. BEFORE THE TRANSFUSION  Who gives blood for transfusions?   Healthy volunteers who are fully evaluated to make sure their blood is safe. This is blood bank  blood. Transfusion therapy is the safest it has ever been in the practice of medicine. Before blood is taken from a donor, a complete history is taken to make sure that person has no history of diseases nor engages in risky social behavior (examples are intravenous drug use or sexual activity with multiple partners). The donor's travel history is screened to minimize risk of transmitting infections, such as malaria. The donated blood is tested for signs of infectious diseases, such as HIV and hepatitis. The blood is then tested to be sure it is compatible with you in order to minimize the chance of a transfusion reaction. If you or a relative donates blood, this is often done in anticipation of surgery and is not appropriate for emergency situations. It takes many days to process the donated blood. RISKS AND COMPLICATIONS Although transfusion therapy is very safe and saves many lives, the main dangers of transfusion include:   Getting an infectious disease.  Developing a transfusion reaction. This is an allergic reaction to something in the blood you were given. Every precaution is taken to prevent this. The decision to have a blood transfusion has been considered carefully by your caregiver before blood is given. Blood is not given unless the benefits outweigh the risks. AFTER THE TRANSFUSION  Right after receiving a blood transfusion, you will usually feel much better and more energetic. This is especially true if your red blood cells have gotten low (anemic). The transfusion raises the level of the red blood cells which carry oxygen, and this usually causes an energy increase.  The nurse administering the transfusion will monitor you carefully for complications. HOME CARE INSTRUCTIONS  No special instructions are needed after a transfusion. You may find your energy is better. Speak with your caregiver about any limitations on activity for underlying diseases  you may have. SEEK MEDICAL CARE IF:    Your condition is not improving after your transfusion.  You develop redness or irritation at the intravenous (IV) site. SEEK IMMEDIATE MEDICAL CARE IF:  Any of the following symptoms occur over the next 12 hours:  Shaking chills.  You have a temperature by mouth above 102 F (38.9 C), not controlled by medicine.  Chest, back, or muscle pain.  People around you feel you are not acting correctly or are confused.  Shortness of breath or difficulty breathing.  Dizziness and fainting.  You get a rash or develop hives.  You have a decrease in urine output.  Your urine turns a dark color or changes to pink, red, or brown. Any of the following symptoms occur over the next 10 days:  You have a temperature by mouth above 102 F (38.9 C), not controlled by medicine.  Shortness of breath.  Weakness after normal activity.  The white part of the eye turns yellow (jaundice).  You have a decrease in the amount of urine or are urinating less often.  Your urine turns a dark color or changes to pink, red, or brown. Document Released: 01/25/2000 Document Revised: 04/21/2011 Document Reviewed: 09/13/2007 [facility] Patient Information 2014 [place], [place].  _______________________________________________________________________

## 2014-02-14 NOTE — Progress Notes (Signed)
Your patient has screened at an elevated risk for Obstructive Sleep Apnea using the Stop-Bang Tool during a pre-surgical vist. A score of 4 or greater is an elevated risk. Score is a 4.

## 2014-02-15 NOTE — Progress Notes (Signed)
Final EKG done 02/14/2014 in [facility].

## 2014-02-16 NOTE — H&P (Signed)
TOTAL KNEE ADMISSION H&P  Patient is being admitted for left total knee arthroplasty.  Subjective:  Chief Complaint:left knee pain.  HPI: [name], 81 y.o. male, has a history of pain and functional disability in the left knee due to arthritis and has failed non-surgical conservative treatments for greater than 12 weeks to includeNSAID's and/or analgesics, corticosteriod injections, viscosupplementation injections and activity modification.  Onset of symptoms was gradual, starting 5 years ago with gradually worsening course since that time. The patient noted no past surgery on the left knee(s).  Patient currently rates pain in the left knee(s) at 7 out of 10 with activity. Patient has night pain, worsening of pain with activity and weight bearing, pain that interferes with activities of daily living, pain with passive range of motion, crepitus and joint swelling.  Patient has evidence of periarticular osteophytes and joint space narrowing by imaging studies. There is no active infection.  Patient Active Problem List   Diagnosis Date Noted  . SOB (shortness of breath) 10/20/2013  . Hypersomnia 10/20/2013  . Edema 04/13/2013  . Coronary artery disease   . Coronary atherosclerosis of native coronary artery 04/12/2013  . Pure hypercholesterolemia 04/12/2013  . Aortic stenosis 04/12/2013  . Essential hypertension, benign 04/12/2013  . PNA (pneumonia) 03/21/2013  . Influenza with respiratory manifestations 03/19/2013  . Osteoarthritis of left knee 09/19/2011  . Lateral meniscus tear, current 09/19/2011  . Medial meniscus, posterior horn derangement 09/19/2011   Past Medical History  Diagnosis Date  . Hypertension   . Heart murmur   . BPH (benign prostatic hypertrophy)   . GERD (gastroesophageal reflux disease)   . Anxiety   . Headache(784.0)     relieved with OTC's  . Dementia     early per pt- recently evaluated at [facility]- eccho7/13  , MRI head EPIC 6/13  . Bruises  easily   . Unsteady gait   . Hyperlipidemia   . HBP (high blood pressure)   . Allergic rhinitis   . AI (aortic insufficiency)     Mild to Mod, Moderate AS Echo 02/2011  . Chronic fatigue   . Left carotid bruit 03/2008    w minimal obstruction on doppler  . OAB (overactive bladder)   . Dementia, vascular     Dr [name]  . Coronary artery disease     s/p PCI of left cir and LAD 2005 and PCI of prox PDA 7/09  . Stroke     possible   . Sleep apnea     STOP BANG SCORE 5 pt states was recently tested stated tested negative  . Shortness of breath dyspnea     with ADLs  . Arthritis     Past Surgical History  Procedure Laterality Date  . Transurethral resection of prostate    . Turp vaporization    . Cardiac catheterization      3 stents first cath 2 stents placed then additional stent placed  . Hemorroidectomy    . Rotator cuff repair  3/13    right  . Tonsillectomy    . Knee arthroscopy  09/19/2011    Procedure: ARTHROSCOPY KNEE;  Surgeon: [name], MD;  Location: [facility];  Service: Orthopedics;  Laterality: Left;  . Colonscopy     . Eye surgery      cataract surgery bilat      Current outpatient prescriptions:  acetaminophen (TYLENOL) 500 MG tablet, Take 500-1,000 mg by mouth every 6 (six) hours as needed for mild pain  or moderate pain., Disp: , Rfl: ;   amLODipine (NORVASC) 5 MG tablet, Take 5 mg by mouth every morning., Disp: , Rfl: ;   aspirin 81 MG tablet, Take 81 mg by mouth daily., Disp: , Rfl: ;   carvedilol (COREG) 12.5 MG tablet, Take 12.5 mg by mouth 2 (two) times daily with a meal., Disp: , Rfl:  clopidogrel (PLAVIX) 75 MG tablet, Take 1 tablet (75 mg total) by mouth daily., Disp: 90 tablet, Rfl: 3;   dextromethorphan-guaiFENesin (MUCINEX DM) 30-600 MG per 12 hr tablet, Take 1 tablet by mouth 2 (two) times daily., Disp: , Rfl: ;   diclofenac (VOLTAREN) 75 MG EC tablet, Take 75 mg by mouth 2 (two) times daily. , Disp: , Rfl:  DM-Doxylamine-Acetaminophen (NYQUIL COLD  & FLU PO), Take 30 mLs by mouth at bedtime as needed (for cold and flu)., Disp: , Rfl: ;   donepezil (ARICEPT) 10 MG tablet, Take 10 mg by mouth at bedtime., Disp: , Rfl: ;   finasteride (PROSCAR) 5 MG tablet, Take 5 mg by mouth daily., Disp: , Rfl: ;   furosemide (LASIX) 20 MG tablet, Take 20 mg by mouth every morning., Disp: , Rfl:  loratadine (CLARITIN) 10 MG tablet, Take 10 mg by mouth daily., Disp: , Rfl: ;   losartan (COZAAR) 50 MG tablet, Take 50 mg by mouth 2 (two) times daily., Disp: , Rfl: ;   Multiple Vitamin (MULTIVITAMIN WITH MINERALS) TABS, Take 1 tablet by mouth daily., Disp: , Rfl: ;   nitroGLYCERIN (NITROSTAT) 0.4 MG SL tablet, Place 0.4 mg under the tongue every 5 (five) minutes as needed. For chest pain, Disp: , Rfl:  omeprazole (PRILOSEC) 40 MG capsule, Take 40 mg by mouth daily., Disp: , Rfl: ;   potassium chloride SA (K-DUR,KLOR-CON) 20 MEQ tablet, Take 1 tablet (20 mEq total) by mouth daily., Disp: 90 tablet, Rfl: 3;   sertraline (ZOLOFT) 50 MG tablet, Take 50 mg by mouth every morning. , Disp: , Rfl: ;   rosuvastatin (CRESTOR) 20 MG tablet,  Take 10 mg by mouth daily. ), Disp: 90 tablet, Rfl: 3  Allergies  Allergen Reactions  . Latex Rash  . Sulfa Antibiotics Hives and Rash    History  Substance Use Topics  . Smoking status: Former Smoker    Types: Cigars    Quit date: 02/10/1970  . Smokeless tobacco: Former Systems developer    Types: Chew  . Alcohol Use: 1.8 oz/week    3 Glasses of wine per week     Comment: weekly/    occ beer    Family History  Problem Relation Age of Onset  . Anesthesia problems Neg Hx   . Lung cancer Father      Review of Systems  Constitutional: Positive for malaise/fatigue. Negative for fever, chills, weight loss and diaphoresis.  HENT: Negative.   Eyes: Negative.   Respiratory: Negative.   Cardiovascular: Negative.   Gastrointestinal: Negative.   Genitourinary: Positive for frequency. Negative for dysuria, urgency, hematuria and flank  pain.  Musculoskeletal: Positive for joint pain. Negative for myalgias, back pain, falls and neck pain.  Skin: Negative.   Neurological: Positive for weakness. Negative for dizziness, tingling, tremors, sensory change, speech change, focal weakness, seizures and loss of consciousness.  Endo/Heme/Allergies: Negative.   Psychiatric/Behavioral: Negative.     Objective:  Physical Exam  Constitutional: He is oriented to person, place, and time. He appears well-developed. No distress.  Overweight  HENT:  Head: Normocephalic and atraumatic.  Right  Ear: External ear normal.  Left Ear: External ear normal.  Nose: Nose normal.  Mouth/Throat: Oropharynx is clear and moist.  Eyes: Conjunctivae and EOM are normal.  Neck: Normal range of motion. Neck supple.  Cardiovascular: Normal rate, regular rhythm and intact distal pulses.   Murmur heard. Respiratory: Effort normal and breath sounds normal. No respiratory distress. He has no wheezes.  GI: Soft. Bowel sounds are normal. He exhibits no distension. There is no tenderness.  Musculoskeletal:       Right hip: Normal.       Left hip: Normal.       Right knee: Normal.       Left knee: He exhibits decreased range of motion and swelling. He exhibits no effusion and no erythema. Tenderness found. Medial joint line and lateral joint line tenderness noted.       Right lower leg: He exhibits no tenderness and no swelling.       Left lower leg: He exhibits no tenderness and no swelling.  Neurological: He is alert and oriented to person, place, and time. He has normal strength and normal reflexes. No sensory deficit.  Skin: No rash noted. He is not diaphoretic. No erythema.  Psychiatric: He has a normal mood and affect. His behavior is normal.    Vitals  Weight: 251 lb Height: 74.75in Body Surface Area: 2.41 m Body Mass Index: 31.58 kg/m  Pulse: 72 (Regular)  BP: 130/80 (Sitting, Left Arm, Standard)  Imaging Review Plain radiographs  demonstrate severe degenerative joint disease of the left knee(s). The overall alignment ismild varus. The bone quality appears to be good for age and reported activity level.  Assessment/Plan:  End stage arthritis, left knee   The patient history, physical examination, clinical judgment of the provider and imaging studies are consistent with end stage degenerative joint disease of the left knee(s) and total knee arthroplasty is deemed medically necessary. The treatment options including medical management, injection therapy arthroscopy and arthroplasty were discussed at length. The risks and benefits of total knee arthroplasty were presented and reviewed. The risks due to aseptic loosening, infection, stiffness, patella tracking problems, thromboembolic complications and other imponderables were discussed. The patient acknowledged the explanation, agreed to proceed with the plan and consent was signed. Patient is being admitted for inpatient treatment for surgery, pain control, PT, OT, prophylactic antibiotics, VTE prophylaxis, progressive ambulation and ADL's and discharge planning. The patient is planning to be discharged home with home health services    Topical TXA PCP: Dr. [name] Cardio: Dr. Ginger Organ, PA-C

## 2014-02-21 ENCOUNTER — Inpatient Hospital Stay (HOSPITAL_COMMUNITY): Payer: Commercial Managed Care - HMO | Admitting: Certified Registered Nurse Anesthetist

## 2014-02-21 ENCOUNTER — Inpatient Hospital Stay (HOSPITAL_COMMUNITY)
Admission: RE | Admit: 2014-02-21 | Discharge: 2014-02-23 | DRG: 470 | Disposition: A | Discharge status: Home / Self Care | Payer: Commercial Managed Care - HMO | Source: Ambulatory Visit | Attending: Orthopedic Surgery | Admitting: Orthopedic Surgery

## 2014-02-21 ENCOUNTER — Encounter (HOSPITAL_COMMUNITY)
Admission: RE | Disposition: A | Discharge status: Home / Self Care | Payer: Self-pay | Source: Ambulatory Visit | Attending: Orthopedic Surgery

## 2014-02-21 ENCOUNTER — Encounter (HOSPITAL_COMMUNITY): Payer: Self-pay | Admitting: *Deleted

## 2014-02-21 DIAGNOSIS — Z7982 Long term (current) use of aspirin: Secondary | ICD-10-CM

## 2014-02-21 DIAGNOSIS — Z9861 Coronary angioplasty status: Secondary | ICD-10-CM | POA: Diagnosis not present

## 2014-02-21 DIAGNOSIS — M1712 Unilateral primary osteoarthritis, left knee: Principal | ICD-10-CM | POA: Diagnosis present

## 2014-02-21 DIAGNOSIS — Z882 Allergy status to sulfonamides status: Secondary | ICD-10-CM | POA: Diagnosis not present

## 2014-02-21 DIAGNOSIS — E785 Hyperlipidemia, unspecified: Secondary | ICD-10-CM | POA: Diagnosis present

## 2014-02-21 DIAGNOSIS — E780 Pure hypercholesterolemia: Secondary | ICD-10-CM | POA: Diagnosis not present

## 2014-02-21 DIAGNOSIS — Z91040 Latex allergy status: Secondary | ICD-10-CM

## 2014-02-21 DIAGNOSIS — G4730 Sleep apnea, unspecified: Secondary | ICD-10-CM | POA: Diagnosis present

## 2014-02-21 DIAGNOSIS — Z87891 Personal history of nicotine dependence: Secondary | ICD-10-CM

## 2014-02-21 DIAGNOSIS — F419 Anxiety disorder, unspecified: Secondary | ICD-10-CM | POA: Diagnosis present

## 2014-02-21 DIAGNOSIS — N3281 Overactive bladder: Secondary | ICD-10-CM | POA: Diagnosis not present

## 2014-02-21 DIAGNOSIS — M179 Osteoarthritis of knee, unspecified: Secondary | ICD-10-CM | POA: Diagnosis not present

## 2014-02-21 DIAGNOSIS — N400 Enlarged prostate without lower urinary tract symptoms: Secondary | ICD-10-CM | POA: Diagnosis present

## 2014-02-21 DIAGNOSIS — R5382 Chronic fatigue, unspecified: Secondary | ICD-10-CM | POA: Diagnosis present

## 2014-02-21 DIAGNOSIS — F0150 Vascular dementia without behavioral disturbance: Secondary | ICD-10-CM | POA: Diagnosis present

## 2014-02-21 DIAGNOSIS — Z8701 Personal history of pneumonia (recurrent): Secondary | ICD-10-CM | POA: Diagnosis not present

## 2014-02-21 DIAGNOSIS — Z96659 Presence of unspecified artificial knee joint: Secondary | ICD-10-CM

## 2014-02-21 DIAGNOSIS — M24562 Contracture, left knee: Secondary | ICD-10-CM | POA: Diagnosis present

## 2014-02-21 DIAGNOSIS — I10 Essential (primary) hypertension: Secondary | ICD-10-CM | POA: Diagnosis not present

## 2014-02-21 DIAGNOSIS — Z8673 Personal history of transient ischemic attack (TIA), and cerebral infarction without residual deficits: Secondary | ICD-10-CM | POA: Diagnosis not present

## 2014-02-21 DIAGNOSIS — I350 Nonrheumatic aortic (valve) stenosis: Secondary | ICD-10-CM | POA: Diagnosis present

## 2014-02-21 DIAGNOSIS — Z9842 Cataract extraction status, left eye: Secondary | ICD-10-CM

## 2014-02-21 DIAGNOSIS — R2689 Other abnormalities of gait and mobility: Secondary | ICD-10-CM | POA: Diagnosis not present

## 2014-02-21 DIAGNOSIS — Z9841 Cataract extraction status, right eye: Secondary | ICD-10-CM | POA: Diagnosis not present

## 2014-02-21 DIAGNOSIS — K219 Gastro-esophageal reflux disease without esophagitis: Secondary | ICD-10-CM | POA: Diagnosis present

## 2014-02-21 DIAGNOSIS — I2510 Atherosclerotic heart disease of native coronary artery without angina pectoris: Secondary | ICD-10-CM | POA: Diagnosis not present

## 2014-02-21 DIAGNOSIS — G4710 Hypersomnia, unspecified: Secondary | ICD-10-CM | POA: Diagnosis present

## 2014-02-21 DIAGNOSIS — Z96652 Presence of left artificial knee joint: Secondary | ICD-10-CM | POA: Diagnosis not present

## 2014-02-21 DIAGNOSIS — M25562 Pain in left knee: Secondary | ICD-10-CM | POA: Diagnosis present

## 2014-02-21 HISTORY — PX: TOTAL KNEE ARTHROPLASTY: SHX125

## 2014-02-21 LAB — TYPE AND SCREEN
ABO/RH(D): O POS
Antibody Screen: NEGATIVE

## 2014-02-21 SURGERY — ARTHROPLASTY, KNEE, TOTAL
Anesthesia: General | Site: Knee | Laterality: Left

## 2014-02-21 MED ORDER — SUCCINYLCHOLINE CHLORIDE 20 MG/ML IJ SOLN
INTRAMUSCULAR | Status: DC | PRN
  Administered 2014-02-21: 100 mg via INTRAVENOUS

## 2014-02-21 MED ORDER — CEFAZOLIN SODIUM 1-5 GM-% IV SOLN
1.0000 g | Freq: Four times a day (QID) | INTRAVENOUS | Status: AC
  Administered 2014-02-21 (×2): 1 g via INTRAVENOUS
  Filled 2014-02-21 (×2): qty 50

## 2014-02-21 MED ORDER — ACETAMINOPHEN 325 MG PO TABS: 650.0000 mg | ORAL_TABLET | Freq: Four times a day (QID) | ORAL | Status: DC | PRN

## 2014-02-21 MED ORDER — METHOCARBAMOL 1000 MG/10ML IJ SOLN
500.0000 mg | Freq: Four times a day (QID) | INTRAVENOUS | Status: DC | PRN
  Administered 2014-02-21: 500 mg via INTRAVENOUS
  Filled 2014-02-21 (×2): qty 5

## 2014-02-21 MED ORDER — BUPIVACAINE LIPOSOME 1.3 % IJ SUSP
20.0000 mL | Freq: Once | INTRAMUSCULAR | Status: AC
Start: 2014-02-21 — End: 2014-02-21
  Administered 2014-02-21: 20 mL
  Filled 2014-02-21: qty 20

## 2014-02-21 MED ORDER — FLEET ENEMA 7-19 GM/118ML RE ENEM: 1.0000 | ENEMA | Freq: Once | RECTAL | Status: AC | PRN

## 2014-02-21 MED ORDER — CEFAZOLIN SODIUM-DEXTROSE 2-3 GM-% IV SOLR
INTRAVENOUS | Status: AC
  Filled 2014-02-21: qty 50

## 2014-02-21 MED ORDER — HYDRALAZINE HCL 20 MG/ML IJ SOLN
INTRAMUSCULAR | Status: DC | PRN
  Administered 2014-02-21 (×2): 5 mg via INTRAVENOUS

## 2014-02-21 MED ORDER — ATROPINE SULFATE 0.1 MG/ML IJ SOLN
INTRAMUSCULAR | Status: AC
  Filled 2014-02-21: qty 10

## 2014-02-21 MED ORDER — CISATRACURIUM BESYLATE (PF) 10 MG/5ML IV SOLN
INTRAVENOUS | Status: DC | PRN
  Administered 2014-02-21 (×2): 6 mg via INTRAVENOUS

## 2014-02-21 MED ORDER — LACTATED RINGERS IV SOLN
INTRAVENOUS | Status: DC
  Administered 2014-02-21 – 2014-02-22 (×2): via INTRAVENOUS

## 2014-02-21 MED ORDER — GLYCOPYRROLATE 0.2 MG/ML IJ SOLN
INTRAMUSCULAR | Status: DC | PRN
  Administered 2014-02-21: 0.2 mg via INTRAVENOUS
  Administered 2014-02-21: 0.4 mg via INTRAVENOUS

## 2014-02-21 MED ORDER — FENTANYL CITRATE 0.05 MG/ML IJ SOLN
INTRAMUSCULAR | Status: AC
  Filled 2014-02-21: qty 2

## 2014-02-21 MED ORDER — DM-GUAIFENESIN ER 30-600 MG PO TB12
1.0000 | ORAL_TABLET | Freq: Two times a day (BID) | ORAL | Status: DC
  Administered 2014-02-21 – 2014-02-23 (×4): 1 via ORAL
  Filled 2014-02-21 (×5): qty 1

## 2014-02-21 MED ORDER — HYDROMORPHONE HCL 1 MG/ML IJ SOLN
0.2500 mg | INTRAMUSCULAR | Status: DC | PRN
  Administered 2014-02-21: 0.25 mg via INTRAVENOUS
  Administered 2014-02-21 (×2): 0.5 mg via INTRAVENOUS
  Administered 2014-02-21 (×3): 0.25 mg via INTRAVENOUS

## 2014-02-21 MED ORDER — POTASSIUM CHLORIDE CRYS ER 20 MEQ PO TBCR
20.0000 meq | EXTENDED_RELEASE_TABLET | Freq: Every day | ORAL | Status: DC
  Administered 2014-02-21 – 2014-02-23 (×3): 20 meq via ORAL
  Filled 2014-02-21 (×3): qty 1

## 2014-02-21 MED ORDER — HYDROMORPHONE HCL 1 MG/ML IJ SOLN
INTRAMUSCULAR | Status: AC
  Filled 2014-02-21: qty 1

## 2014-02-21 MED ORDER — ONDANSETRON HCL 4 MG PO TABS
4.0000 mg | ORAL_TABLET | Freq: Four times a day (QID) | ORAL | Status: DC | PRN
  Filled 2014-02-21: qty 1

## 2014-02-21 MED ORDER — HYDROMORPHONE HCL 1 MG/ML IJ SOLN: 1.0000 mg | INTRAMUSCULAR | Status: DC | PRN

## 2014-02-21 MED ORDER — FUROSEMIDE 20 MG PO TABS: 20.0000 mg | ORAL_TABLET | Freq: Every day | ORAL | Status: DC

## 2014-02-21 MED ORDER — FINASTERIDE 5 MG PO TABS
5.0000 mg | ORAL_TABLET | Freq: Every day | ORAL | Status: DC
  Administered 2014-02-22 – 2014-02-23 (×2): 5 mg via ORAL
  Filled 2014-02-21 (×2): qty 1

## 2014-02-21 MED ORDER — NEOSTIGMINE METHYLSULFATE 10 MG/10ML IV SOLN
INTRAVENOUS | Status: AC
Start: 2014-02-21 — End: 2014-02-21
  Filled 2014-02-21: qty 1

## 2014-02-21 MED ORDER — LACTATED RINGERS IV SOLN
INTRAVENOUS | Status: DC
Start: 2014-02-21 — End: 2014-02-23
  Administered 2014-02-21 (×2): via INTRAVENOUS

## 2014-02-21 MED ORDER — BISACODYL 5 MG PO TBEC: 5.0000 mg | DELAYED_RELEASE_TABLET | Freq: Every day | ORAL | Status: DC | PRN

## 2014-02-21 MED ORDER — THROMBIN 5000 UNITS EX SOLR
CUTANEOUS | Status: AC
  Filled 2014-02-21: qty 10000

## 2014-02-21 MED ORDER — HYDROCODONE-ACETAMINOPHEN 5-325 MG PO TABS
1.0000 | ORAL_TABLET | ORAL | Status: DC | PRN
  Administered 2014-02-21 (×3): 1 via ORAL
  Administered 2014-02-22 (×3): 2 via ORAL
  Administered 2014-02-22 – 2014-02-23 (×6): 1 via ORAL
  Filled 2014-02-21 (×2): qty 1
  Filled 2014-02-21: qty 2
  Filled 2014-02-21 (×2): qty 1
  Filled 2014-02-21: qty 2
  Filled 2014-02-21 (×5): qty 1
  Filled 2014-02-21: qty 2
  Filled 2014-02-21: qty 1

## 2014-02-21 MED ORDER — PHENOL 1.4 % MT LIQD
1.0000 | OROMUCOSAL | Status: DC | PRN
  Filled 2014-02-21: qty 177

## 2014-02-21 MED ORDER — ONDANSETRON HCL 4 MG/2ML IJ SOLN
INTRAMUSCULAR | Status: DC | PRN
  Administered 2014-02-21: 4 mg via INTRAVENOUS

## 2014-02-21 MED ORDER — GLYCOPYRROLATE 0.2 MG/ML IJ SOLN
INTRAMUSCULAR | Status: AC
  Filled 2014-02-21: qty 1

## 2014-02-21 MED ORDER — BUPIVACAINE HCL (PF) 0.25 % IJ SOLN
INTRAMUSCULAR | Status: DC | PRN
  Administered 2014-02-21: 20 mL

## 2014-02-21 MED ORDER — LOSARTAN POTASSIUM 50 MG PO TABS
50.0000 mg | ORAL_TABLET | Freq: Two times a day (BID) | ORAL | Status: DC
  Administered 2014-02-21 – 2014-02-23 (×4): 50 mg via ORAL
  Filled 2014-02-21 (×5): qty 1

## 2014-02-21 MED ORDER — THROMBIN 5000 UNITS EX SOLR
OROMUCOSAL | Status: DC | PRN
  Administered 2014-02-21: 5 mL via TOPICAL

## 2014-02-21 MED ORDER — MIDAZOLAM HCL 5 MG/5ML IJ SOLN
INTRAMUSCULAR | Status: DC | PRN
  Administered 2014-02-21 (×2): 1 mg via INTRAVENOUS

## 2014-02-21 MED ORDER — FUROSEMIDE 20 MG PO TABS
20.0000 mg | ORAL_TABLET | Freq: Every morning | ORAL | Status: DC
  Filled 2014-02-21: qty 1

## 2014-02-21 MED ORDER — HYDRALAZINE HCL 20 MG/ML IJ SOLN
INTRAMUSCULAR | Status: AC
  Filled 2014-02-21: qty 1

## 2014-02-21 MED ORDER — DONEPEZIL HCL 10 MG PO TABS
10.0000 mg | ORAL_TABLET | Freq: Every day | ORAL | Status: DC
  Administered 2014-02-21 – 2014-02-22 (×2): 10 mg via ORAL
  Filled 2014-02-21 (×3): qty 1

## 2014-02-21 MED ORDER — OXYCODONE-ACETAMINOPHEN 5-325 MG PO TABS: 2.0000 | ORAL_TABLET | ORAL | Status: DC | PRN

## 2014-02-21 MED ORDER — SODIUM CHLORIDE 0.9 % IJ SOLN
INTRAMUSCULAR | Status: DC | PRN
  Administered 2014-02-21: 20 mL

## 2014-02-21 MED ORDER — PROPOFOL 10 MG/ML IV BOLUS
INTRAVENOUS | Status: AC
  Filled 2014-02-21: qty 20

## 2014-02-21 MED ORDER — CHLORHEXIDINE GLUCONATE 4 % EX LIQD: 60.0000 mL | Freq: Once | CUTANEOUS | Status: DC

## 2014-02-21 MED ORDER — FERROUS SULFATE 325 (65 FE) MG PO TABS
325.0000 mg | ORAL_TABLET | Freq: Three times a day (TID) | ORAL | Status: DC
  Administered 2014-02-22 – 2014-02-23 (×4): 325 mg via ORAL
  Filled 2014-02-21 (×7): qty 1

## 2014-02-21 MED ORDER — ROSUVASTATIN CALCIUM 40 MG PO TABS
40.0000 mg | ORAL_TABLET | Freq: Every day | ORAL | Status: DC
Start: 2014-02-21 — End: 2014-02-21

## 2014-02-21 MED ORDER — FENTANYL CITRATE 0.05 MG/ML IJ SOLN
INTRAMUSCULAR | Status: DC | PRN
  Administered 2014-02-21: 100 ug via INTRAVENOUS
  Administered 2014-02-21 (×2): 50 ug via INTRAVENOUS

## 2014-02-21 MED ORDER — KETOROLAC TROMETHAMINE 30 MG/ML IJ SOLN
INTRAMUSCULAR | Status: AC
  Filled 2014-02-21: qty 1

## 2014-02-21 MED ORDER — SODIUM CHLORIDE 0.9 % IJ SOLN
INTRAMUSCULAR | Status: AC
  Filled 2014-02-21: qty 20

## 2014-02-21 MED ORDER — PROPOFOL 10 MG/ML IV BOLUS
INTRAVENOUS | Status: AC
Start: 2014-02-21 — End: 2014-02-21
  Filled 2014-02-21: qty 20

## 2014-02-21 MED ORDER — ONDANSETRON HCL 4 MG/2ML IJ SOLN
INTRAMUSCULAR | Status: AC
  Filled 2014-02-21: qty 2

## 2014-02-21 MED ORDER — ACETAMINOPHEN 10 MG/ML IV SOLN
1000.0000 mg | Freq: Four times a day (QID) | INTRAVENOUS | Status: DC
  Filled 2014-02-21 (×4): qty 100

## 2014-02-21 MED ORDER — KETOROLAC TROMETHAMINE 30 MG/ML IJ SOLN
30.0000 mg | Freq: Four times a day (QID) | INTRAMUSCULAR | Status: DC | PRN
  Administered 2014-02-21: 30 mg via INTRAVENOUS
  Filled 2014-02-21: qty 1

## 2014-02-21 MED ORDER — RIVAROXABAN 10 MG PO TABS
10.0000 mg | ORAL_TABLET | Freq: Every day | ORAL | Status: DC
  Administered 2014-02-22 – 2014-02-23 (×2): 10 mg via ORAL
  Filled 2014-02-21 (×3): qty 1

## 2014-02-21 MED ORDER — CEFAZOLIN SODIUM-DEXTROSE 2-3 GM-% IV SOLR
2.0000 g | INTRAVENOUS | Status: AC
  Administered 2014-02-21: 2 mg via INTRAVENOUS

## 2014-02-21 MED ORDER — LACTATED RINGERS IV SOLN
INTRAVENOUS | Status: DC
  Administered 2014-02-21: 11:00:00 via INTRAVENOUS

## 2014-02-21 MED ORDER — MIDAZOLAM HCL 2 MG/2ML IJ SOLN
INTRAMUSCULAR | Status: AC
  Filled 2014-02-21: qty 2

## 2014-02-21 MED ORDER — POLYMYXIN B SULFATE 500000 UNITS IJ SOLR
INTRAMUSCULAR | Status: DC | PRN
  Administered 2014-02-21: 500 mL

## 2014-02-21 MED ORDER — CISATRACURIUM BESYLATE 20 MG/10ML IV SOLN
INTRAVENOUS | Status: AC
  Filled 2014-02-21: qty 10

## 2014-02-21 MED ORDER — AMLODIPINE BESYLATE 5 MG PO TABS
5.0000 mg | ORAL_TABLET | Freq: Every morning | ORAL | Status: DC
  Administered 2014-02-22 – 2014-02-23 (×2): 5 mg via ORAL
  Filled 2014-02-21 (×2): qty 1

## 2014-02-21 MED ORDER — CARVEDILOL 12.5 MG PO TABS
12.5000 mg | ORAL_TABLET | Freq: Two times a day (BID) | ORAL | Status: DC
  Administered 2014-02-21 – 2014-02-23 (×4): 12.5 mg via ORAL
  Filled 2014-02-21 (×6): qty 1

## 2014-02-21 MED ORDER — SODIUM CHLORIDE 0.9 % IR SOLN
Status: AC
  Filled 2014-02-21: qty 1

## 2014-02-21 MED ORDER — GLYCOPYRROLATE 0.2 MG/ML IJ SOLN
INTRAMUSCULAR | Status: AC
  Filled 2014-02-21: qty 2

## 2014-02-21 MED ORDER — METHOCARBAMOL 500 MG PO TABS
500.0000 mg | ORAL_TABLET | Freq: Four times a day (QID) | ORAL | Status: DC | PRN
  Administered 2014-02-21 – 2014-02-22 (×2): 500 mg via ORAL
  Filled 2014-02-21 (×2): qty 1

## 2014-02-21 MED ORDER — NITROGLYCERIN 0.4 MG SL SUBL: 0.4000 mg | SUBLINGUAL_TABLET | SUBLINGUAL | Status: DC | PRN

## 2014-02-21 MED ORDER — SODIUM CHLORIDE 0.9 % IR SOLN
Status: DC | PRN
  Administered 2014-02-21: 1000 mL

## 2014-02-21 MED ORDER — BUPIVACAINE HCL (PF) 0.25 % IJ SOLN
INTRAMUSCULAR | Status: AC
  Filled 2014-02-21: qty 30

## 2014-02-21 MED ORDER — ROSUVASTATIN CALCIUM 10 MG PO TABS
10.0000 mg | ORAL_TABLET | Freq: Every day | ORAL | Status: DC
  Administered 2014-02-22 – 2014-02-23 (×2): 10 mg via ORAL
  Filled 2014-02-21 (×2): qty 1

## 2014-02-21 MED ORDER — ACETAMINOPHEN 650 MG RE SUPP: 650.0000 mg | Freq: Four times a day (QID) | RECTAL | Status: DC | PRN

## 2014-02-21 MED ORDER — PROPOFOL 10 MG/ML IV BOLUS
INTRAVENOUS | Status: DC | PRN
  Administered 2014-02-21: 50 mg via INTRAVENOUS
  Administered 2014-02-21: 150 mg via INTRAVENOUS

## 2014-02-21 MED ORDER — LIDOCAINE HCL (CARDIAC) 20 MG/ML IV SOLN
INTRAVENOUS | Status: AC
  Filled 2014-02-21: qty 5

## 2014-02-21 MED ORDER — MENTHOL 3 MG MT LOZG
1.0000 | LOZENGE | OROMUCOSAL | Status: DC | PRN
  Filled 2014-02-21: qty 9

## 2014-02-21 MED ORDER — PROPOFOL INFUSION 10 MG/ML OPTIME: INTRAVENOUS | Status: DC | PRN

## 2014-02-21 MED ORDER — TRANEXAMIC ACID 100 MG/ML IV SOLN
2000.0000 mg | Freq: Once | INTRAVENOUS | Status: DC
  Filled 2014-02-21: qty 20

## 2014-02-21 MED ORDER — ONDANSETRON HCL 4 MG/2ML IJ SOLN: 4.0000 mg | Freq: Four times a day (QID) | INTRAMUSCULAR | Status: DC | PRN

## 2014-02-21 MED ORDER — NEOSTIGMINE METHYLSULFATE 10 MG/10ML IV SOLN
INTRAVENOUS | Status: DC | PRN
  Administered 2014-02-21: 4 mg via INTRAVENOUS

## 2014-02-21 MED ORDER — FUROSEMIDE 20 MG PO TABS
20.0000 mg | ORAL_TABLET | Freq: Every morning | ORAL | Status: DC
  Administered 2014-02-22 – 2014-02-23 (×2): 20 mg via ORAL
  Filled 2014-02-21 (×2): qty 1

## 2014-02-21 MED ORDER — POLYETHYLENE GLYCOL 3350 17 G PO PACK
17.0000 g | PACK | Freq: Every day | ORAL | Status: DC | PRN
  Administered 2014-02-22: 17 g via ORAL
  Filled 2014-02-21: qty 1

## 2014-02-21 MED ORDER — ALUM & MAG HYDROXIDE-SIMETH 200-200-20 MG/5ML PO SUSP: 30.0000 mL | ORAL | Status: DC | PRN

## 2014-02-21 SURGICAL SUPPLY — 72 items
BAG SPEC THK2 15X12 ZIP CLS (MISCELLANEOUS)
BAG ZIPLOCK 12X15 (MISCELLANEOUS) IMPLANT
BANDAGE ELASTIC 4 VELCRO ST LF (GAUZE/BANDAGES/DRESSINGS) ×3 IMPLANT
BANDAGE ELASTIC 6 VELCRO ST LF (GAUZE/BANDAGES/DRESSINGS) ×3 IMPLANT
BANDAGE ESMARK 6X9 LF (GAUZE/BANDAGES/DRESSINGS) ×1 IMPLANT
BLADE SAG 18X100X1.27 (BLADE) ×3 IMPLANT
BLADE SAW SGTL 11.0X1.19X90.0M (BLADE) ×3 IMPLANT
BNDG CMPR 9X6 STRL LF SNTH (GAUZE/BANDAGES/DRESSINGS) ×1
BNDG COHESIVE 4X5 TAN STRL (GAUZE/BANDAGES/DRESSINGS) ×2 IMPLANT
BNDG ESMARK 6X9 LF (GAUZE/BANDAGES/DRESSINGS) ×3
BONE CEMENT GENTAMICIN (Cement) ×6 IMPLANT
CAP KNEE TOTAL 3 SIGMA ×2 IMPLANT
CEMENT BONE GENTAMICIN 40 (Cement) ×2 IMPLANT
CUFF TOURN SGL QUICK 34 (TOURNIQUET CUFF) ×3
CUFF TRNQT CYL 34X4X40X1 (TOURNIQUET CUFF) ×1 IMPLANT
DRAPE EXTREMITY T 121X128X90 (DRAPE) ×3 IMPLANT
DRAPE INCISE IOBAN 66X45 STRL (DRAPES) IMPLANT
DRAPE POUCH INSTRU U-SHP 10X18 (DRAPES) ×3 IMPLANT
DRAPE U-SHAPE 47X51 STRL (DRAPES) ×3 IMPLANT
DRSG AQUACEL AG ADV 3.5X10 (GAUZE/BANDAGES/DRESSINGS) ×3 IMPLANT
DRSG PAD ABDOMINAL 8X10 ST (GAUZE/BANDAGES/DRESSINGS) IMPLANT
DRSG TEGADERM 4X4.75 (GAUZE/BANDAGES/DRESSINGS) ×3 IMPLANT
DURAPREP 26ML APPLICATOR (WOUND CARE) ×3 IMPLANT
ELECT REM PT RETURN 9FT ADLT (ELECTROSURGICAL) ×3
ELECTRODE REM PT RTRN 9FT ADLT (ELECTROSURGICAL) ×1 IMPLANT
EVACUATOR 1/8 PVC DRAIN (DRAIN) ×3 IMPLANT
FACESHIELD WRAPAROUND (MASK) ×15 IMPLANT
FACESHIELD WRAPAROUND OR TEAM (MASK) ×5 IMPLANT
GAUZE SPONGE 2X2 8PLY STRL LF (GAUZE/BANDAGES/DRESSINGS) ×1 IMPLANT
GLOVE BIOGEL PI IND STRL 6.5 (GLOVE) ×1 IMPLANT
GLOVE BIOGEL PI IND STRL 8 (GLOVE) ×1 IMPLANT
GLOVE BIOGEL PI INDICATOR 6.5 (GLOVE) ×6
GLOVE BIOGEL PI INDICATOR 8 (GLOVE) ×4
GLOVE ECLIPSE 8.0 STRL XLNG CF (GLOVE) ×2 IMPLANT
GLOVE SURG SS PI 6.5 STRL IVOR (GLOVE) ×5 IMPLANT
GLOVE SURG SS PI 8.0 STRL IVOR (GLOVE) ×4 IMPLANT
GOWN STRL REUS W/TWL LRG LVL3 (GOWN DISPOSABLE) ×3 IMPLANT
GOWN STRL REUS W/TWL XL LVL3 (GOWN DISPOSABLE) ×3 IMPLANT
HANDPIECE INTERPULSE COAX TIP (DISPOSABLE) ×3
IMMOBILIZER KNEE 20 (SOFTGOODS) ×2 IMPLANT
IMMOBILIZER KNEE 20 THIGH 36 (SOFTGOODS) ×1 IMPLANT
KIT BASIN OR (CUSTOM PROCEDURE TRAY) ×3 IMPLANT
LIQUID BAND (GAUZE/BANDAGES/DRESSINGS) ×3 IMPLANT
MANIFOLD NEPTUNE II (INSTRUMENTS) ×3 IMPLANT
NDL SAFETY ECLIPSE 18X1.5 (NEEDLE) IMPLANT
NEEDLE HYPO 18GX1.5 SHARP (NEEDLE)
NEEDLE HYPO 22GX1.5 SAFETY (NEEDLE) ×6 IMPLANT
NS IRRIG 1000ML POUR BTL (IV SOLUTION) IMPLANT
PACK TOTAL JOINT (CUSTOM PROCEDURE TRAY) ×3 IMPLANT
PADDING CAST COTTON 6X4 STRL (CAST SUPPLIES) IMPLANT
POSITIONER SURGICAL ARM (MISCELLANEOUS) ×3 IMPLANT
SET HNDPC FAN SPRY TIP SCT (DISPOSABLE) ×1 IMPLANT
SET PAD KNEE POSITIONER (MISCELLANEOUS) ×3 IMPLANT
SPONGE GAUZE 2X2 STER 10/PKG (GAUZE/BANDAGES/DRESSINGS) ×2
SPONGE LAP 18X18 X RAY DECT (DISPOSABLE) IMPLANT
SPONGE SURGIFOAM ABS GEL 100 (HEMOSTASIS) ×3 IMPLANT
STAPLER VISISTAT 35W (STAPLE) IMPLANT
SUCTION FRAZIER 12FR DISP (SUCTIONS) ×3 IMPLANT
SUT BONE WAX W31G (SUTURE) ×3 IMPLANT
SUT MNCRL AB 4-0 PS2 18 (SUTURE) ×3 IMPLANT
SUT VIC AB 1 CT1 27 (SUTURE) ×12
SUT VIC AB 1 CT1 27XBRD ANTBC (SUTURE) ×2 IMPLANT
SUT VIC AB 2-0 CT1 27 (SUTURE) ×15
SUT VIC AB 2-0 CT1 TAPERPNT 27 (SUTURE) ×3 IMPLANT
SUT VLOC 180 0 24IN GS25 (SUTURE) ×3 IMPLANT
SYR 20CC LL (SYRINGE) ×6 IMPLANT
TOWEL OR 17X26 10 PK STRL BLUE (TOWEL DISPOSABLE) ×3 IMPLANT
TOWEL OR NON WOVEN STRL DISP B (DISPOSABLE) IMPLANT
TOWER CARTRIDGE SMART MIX (DISPOSABLE) ×3 IMPLANT
TRAY FOLEY CATH 14FRSI W/METER (CATHETERS) ×1 IMPLANT
WATER STERILE IRR 1500ML POUR (IV SOLUTION) ×3 IMPLANT
WRAP KNEE MAXI GEL POST OP (GAUZE/BANDAGES/DRESSINGS) ×3 IMPLANT

## 2014-02-21 NOTE — Anesthesia Preprocedure Evaluation (Addendum)
Anesthesia Evaluation  Patient identified by MRN, date of birth, ID band Patient awake    Reviewed: Allergy & Precautions, H&P , NPO status , Patient's Chart, lab work & pertinent test results, reviewed documented beta blocker date and time   Airway Mallampati: II  TM Distance: >3 FB Neck ROM: Full    Dental  (+) Dental Advisory Given, Missing, Poor Dentition All teeth worn down severely:   Pulmonary neg pulmonary ROS, shortness of breath and with exertion, former smoker,  breath sounds clear to auscultation  Pulmonary exam normal       Cardiovascular hypertension, Pt. on medications and Pt. on home beta blockers + CAD and + Cardiac Stents + Valvular Problems/Murmurs (moderate) AS and AI Rhythm:Regular Rate:Normal - Systolic murmurs    Neuro/Psych  Headaches, Anxiety Dementia - mild TIAnegative psych ROS   GI/Hepatic negative GI ROS, Neg liver ROS, GERD-  Controlled and Medicated,  Endo/Other  negative endocrine ROS  Renal/GU negative Renal ROS  negative genitourinary   Musculoskeletal negative musculoskeletal ROS (+)   Abdominal   Peds negative pediatric ROS (+)  Hematology negative hematology ROS (+)   Anesthesia Other Findings   Reproductive/Obstetrics negative OB ROS                            Anesthesia Physical Anesthesia Plan  ASA: III  Anesthesia Plan: General   Post-op Pain Management:    Induction: Intravenous  Airway Management Planned: Oral ETT  Additional Equipment:   Intra-op Plan:   Post-operative Plan: Extubation in OR  Informed Consent: I have reviewed the patients History and Physical, chart, labs and discussed the procedure including the risks, benefits and alternatives for the proposed anesthesia with the patient or authorized representative who has indicated his/her understanding and acceptance.   Dental Advisory Given  Plan Discussed with: CRNA and  Surgeon  Anesthesia Plan Comments:         Anesthesia Quick Evaluation

## 2014-02-21 NOTE — Op Note (Signed)
[name], [name]               ACCOUNT NO.:  [id number]  MEDICAL RECORD NO.:  [id number]  LOCATION:  [place]                         FACILITY:  [facility]  PHYSICIAN:  Kipp Brood. Shogo Larkey, M.D.DATE OF BIRTH:  [birth year]  DATE OF PROCEDURE:  02/21/2014 DATE OF DISCHARGE:                              OPERATIVE REPORT   SURGEON:  Kipp Brood. Emina Ribaudo, M.D.  ASSISTANT:  Ardeen Jourdain, [place]  PREOPERATIVE DIAGNOSIS:  Severe degenerative primary osteoarthritis of the left knee with bone on bone and a flexion contracture.  POSTOPERATIVE DIAGNOSIS:  Severe degenerative primary osteoarthritis of the left knee with bone on bone and a flexion contracture.  OPERATION:  Left total knee arthroplasty utilizing DePuy system.  All 3 components were cemented.  Gentamicin was used in the cement.  The sizes used were as follows; we used a size 6 left femoral component posterior cruciate sacrificing type, tibial tray size 5, the insert was a size 6, 10 mm thickness polyethylene rotating platform insert.  The patella was a size 41 with 3 pegs.  DESCRIPTION OF PROCEDURE:  Under general anesthesia, routine orthopedic prep and draping of the left lower extremity was carried out.  The appropriate time-out was first carried out.  I also marked the appropriate left leg in the holding area.  At this time, the leg was exsanguinated with Esmarch, tourniquet was elevated at 325 mmHg.  The knee was flexed.  The [facility] Knee holder was used.  An incision was made over the anterior aspect of the left knee.  Bleeders were identified and cauterized.  Both flaps were created.  I then carried out a median parapatellar incision, reflected the patella laterally, flexed the knee and did medial and lateral meniscectomies and did anterior- posterior cruciate ligament resection.  Following that, initial drill holes made in the intercondylar notch just above the notch of the femur. The guide rod was inserted into the canal.  We  thoroughly irrigated out the femoral canal.  Following that, we then removed 30 mm thickness off the distal femur in the usual fashion.  I then measured the femur to be a size, at that time, 6.  We then did our anterior-posterior chamfering cuts for a size 6, left femoral component.  Next attention was directed to the tibia, I removed 6 mm thickness off the affected medial side of the tibia.  We utilized the intramedullary guide to do the resection. After the tibia was repaired, we then inserted our lamina spreaders and removed the posterior spurs in the femur.  We then went on and inserted our spacer block for 10 mm thickness.  Had excellent flexion and excellent extension.  I then went on and continued to prepare the tibia, I did a keel cut of the tibia.  The femoral notch cut then was made. The trial components were inserted, the knee then was extended and I did a resurfacing procedure on the patella for a size 41 patella with 3- drill hole.  All trial components were removed.  I thoroughly water picked out the knee and inserted and cemented all 3 components simultaneously.  At the end of the procedure, all cement was removed, loose pieces of cement  were removed and water picked the knee to make sure we removed all loose pieces of cement.  I then injected 20 mL of 0.25% Marcaine plain in the soft tissue.  The permanent rotating platform size 6, 10 mm thickness was inserted and the knee was reduced.  We had excellent flexion and excellent extension and excellent stability in medial and lateral plane.  I then inserted a Hemovac drain, closed the wound in layers in usual fashion.  Sterile dressings were applied.  The patient had 2 g of IV Ancef preop.          ______________________________ Kipp Brood. Gladstone Lighter, M.D.     RAG/MEDQ  D:  02/21/2014  T:  02/21/2014  Job:  [id number]

## 2014-02-21 NOTE — Interval H&P Note (Signed)
History and Physical Interval Note:  02/21/2014 7:12 AM  [name]  has presented today for surgery, with the diagnosis of OA OF LEFT KNEE  The various methods of treatment have been discussed with the patient and family. After consideration of risks, benefits and other options for treatment, the patient has consented to  Procedure(s): LEFT TOTAL KNEE ARTHROPLASTY (Left) as a surgical intervention .  The patient's history has been reviewed, patient examined, no change in status, stable for surgery.  I have reviewed the patient's chart and labs.  Questions were answered to the patient's satisfaction.     [name] A

## 2014-02-21 NOTE — Brief Op Note (Signed)
02/21/2014  9:11 AM  PATIENT:  [name]  81 y.o. male  PRE-OPERATIVE DIAGNOSIS: Primary Osteoarthritis of Left Knee   POST-OPERATIVE DIAGNOSIS:  Primary Osteoarthritis OF LEFT KNEE  PROCEDURE:  Procedure(s): LEFT TOTAL KNEE ARTHROPLASTY (Left)  SURGEON:  Surgeon(s) and Role:    * [name], MD - Primary  PHYSICIAN ASSISTANT:Amber Hickam Housing PA   ASSISTANTS: Ardeen Jourdain PA   ANESTHESIA:   general  EBL:  Total I/O In: 1000 [I.V.:1000] Out: 260 [Urine:260]  BLOOD ADMINISTERED:none  DRAINS: (one) Hemovact drain(s) in the Hemovac with  Suction Open   LOCAL MEDICATIONS USED:  MARCAINE 20cc of 0.25% and then20cc of Exparel mixed with 20cc of Normal Saline.    SPECIMEN:  No Specimen  DISPOSITION OF SPECIMEN:  N/A  COUNTS:  YES  TOURNIQUET:  * Missing tourniquet times found for documented tourniquets in log:  [id number] *  DICTATION: .Other Dictation: Dictation Number [id number]  PLAN OF CARE: Admit to inpatient   PATIENT DISPOSITION:  Stable in OR   Delay start of Pharmacological VTE agent (>24hrs) due to surgical blood loss or risk of bleeding: yes

## 2014-02-21 NOTE — Evaluation (Signed)
Physical Therapy Evaluation Patient Details Name: [name] MRN: [id number] DOB: April 20, 1933 Today's Date: 02/21/2014   History of Present Illness  s/p L TKA   Clinical Impression  Pt s/p LTKA to benefit from PT to be able to return home with wife assisting at [facility].     Follow Up Recommendations Home health PT    Equipment Recommendations  Rolling walker with 5" wheels (may need RW depending if he is able to safely use his rollator upon DC or needs  a RW. )    Recommendations for Other Services       Precautions / Restrictions Precautions Precautions: Knee Required Braces or Orthoses: Knee Immobilizer - Right      Mobility  Bed Mobility Overal bed mobility: Needs Assistance;+ 2 for safety/equipment Bed Mobility: Supine to Sit     Supine to sit: Min assist     General bed mobility comments: with RLE and cues for sequencing  Transfers Overall transfer level: Needs assistance Equipment used: Rolling walker (2 wheeled) Transfers: Sit to/from Stand Sit to Stand: Min assist         General transfer comment: cues for RW safety and sequencing   Ambulation/Gait Ambulation/Gait assistance: Min assist;+2 safety/equipment Ambulation Distance (Feet): 10 Feet Assistive device: Rolling walker (2 wheeled) Gait Pattern/deviations: Step-to pattern Gait velocity: slow   General Gait Details: constant cues for sequencing and pt with slighly nauseated and light headed during session. Felt better once returned to chair.   Stairs            Wheelchair Mobility    Modified Rankin (Stroke Patients Only)       Balance                                             Pertinent Vitals/Pain Pain Assessment: 0-10 Pain Score: 2  Pain Location: R Knee ... but much better than it was Pain Descriptors / Indicators: Aching Pain Intervention(s): Monitored during session;Ice applied    Home Living Family/patient expects to be discharged to:: Private  residence Living Arrangements: Spouse/significant other Available Help at Discharge: Family Type of Home: House Home Access: Stairs to enter Entrance Stairs-Rails: Can reach both Entrance Stairs-Number of Steps: 2 Home Layout: One level Home Equipment: [place] - 4 wheels;Cane - single point;Electric scooter      Prior Function Level of Independence: Independent with assistive device(s)         Comments: pt used cane mostly or the walls and funriture      Hand Dominance        Extremity/Trunk Assessment               Lower Extremity Assessment: Overall WFL for tasks assessed;LLE deficits/detail   LLE Deficits / Details: able to perform quad set for glut and SLR with assistance, knee AAROM supine 0-50 degrees.      Communication   Communication: No difficulties (some expressive difficulties (word retrieval) but pt's and wife stated this has been this way  since he was little)  Cognition Arousal/Alertness: Awake/alert Behavior During Therapy: WFL for tasks assessed/performed Overall Cognitive Status: Within Functional Limits for tasks assessed                      General Comments      Exercises Total Joint Exercises Ankle Circles/Pumps: AROM;Both;10 reps;Supine Quad Sets: AROM;Left;5 reps;Supine  Heel Slides: AAROM;Supine;Left;5 reps Straight Leg Raises: AAROM;Supine;Left;5 reps      Assessment/Plan    PT Assessment Patient needs continued PT services  PT Diagnosis Difficulty walking   PT Problem List Decreased strength;Decreased range of motion;Decreased activity tolerance;Decreased mobility;Decreased knowledge of use of DME  PT Treatment Interventions DME instruction;Gait training;Stair training;Functional mobility training;Therapeutic activities;Therapeutic exercise   PT Goals (Current goals can be found in the Care Plan section) Acute Rehab PT Goals Patient Stated Goal: I want to be able to go home from here. PT Goal Formulation: With  patient/family Time For Goal Achievement: 03/07/14 Potential to Achieve Goals: Good    Frequency 7X/week   Barriers to discharge        Co-evaluation               End of Session   Activity Tolerance: Patient tolerated treatment well Patient left: in chair;with call bell/phone within reach;with family/visitor present Nurse Communication: Mobility status         Time: 1554-1630 PT Time Calculation (min) (ACUTE ONLY): 36 min   Charges:   PT Evaluation $Initial PT Evaluation Tier I: 1 Procedure PT Treatments $Therapeutic Exercise: 8-22 mins $Therapeutic Activity: 8-22 mins   PT G CodesClide Dales 03-21-14, 5:44 PM Clide Dales, PT Pager: [phone] 2014/03/21

## 2014-02-21 NOTE — Transfer of Care (Signed)
Immediate Anesthesia Transfer of Care Note  Patient: [name]  Procedure(s) Performed: Procedure(s): LEFT TOTAL KNEE ARTHROPLASTY (Left)  Patient Location: PACU  Anesthesia Type:General  Level of Consciousness: awake and alert   Airway & Oxygen Therapy: Patient Spontanous Breathing and Patient connected to face mask oxygen  Post-op Assessment: Report given to PACU RN  Post vital signs: Reviewed and stable  Complications: No apparent anesthesia complications

## 2014-02-21 NOTE — Progress Notes (Signed)
Utilization review completed.  

## 2014-02-21 NOTE — Anesthesia Postprocedure Evaluation (Signed)
  Anesthesia Post-op Note  Patient: [name]  Procedure(s) Performed: Procedure(s) (LRB): LEFT TOTAL KNEE ARTHROPLASTY (Left)  Patient Location: PACU  Anesthesia Type: General  Level of Consciousness: awake and alert   Airway and Oxygen Therapy: Patient Spontanous Breathing  Post-op Pain: mild  Post-op Assessment: Post-op Vital signs reviewed, Patient's Cardiovascular Status Stable, Respiratory Function Stable, Patent Airway and No signs of Nausea or vomiting  Last Vitals:  Filed Vitals:   02/21/14 1200  BP:   Pulse:   Temp:   Resp: 18    Post-op Vital Signs: stable   Complications: No apparent anesthesia complications

## 2014-02-22 ENCOUNTER — Encounter (HOSPITAL_COMMUNITY): Payer: Self-pay | Admitting: Orthopedic Surgery

## 2014-02-22 LAB — CBC
HEMATOCRIT: 38.4 % — AB (ref 39.0–52.0)
Hemoglobin: 12.7 g/dL — ABNORMAL LOW (ref 13.0–17.0)
MCH: 30.5 pg (ref 26.0–34.0)
MCHC: 33.1 g/dL (ref 30.0–36.0)
MCV: 92.1 fL (ref 78.0–100.0)
Platelets: 163 10*3/uL (ref 150–400)
RBC: 4.17 MIL/uL — AB (ref 4.22–5.81)
RDW: 13.5 % (ref 11.5–15.5)
WBC: 7.1 10*3/uL (ref 4.0–10.5)

## 2014-02-22 LAB — BASIC METABOLIC PANEL
ANION GAP: 5 (ref 5–15)
BUN: 11 mg/dL (ref 6–23)
CHLORIDE: 106 meq/L (ref 96–112)
CO2: 26 mmol/L (ref 19–32)
CREATININE: 0.77 mg/dL (ref 0.50–1.35)
Calcium: 8.5 mg/dL (ref 8.4–10.5)
GFR calc non Af Amer: 85 mL/min — ABNORMAL LOW (ref >90)
Glucose, Bld: 133 mg/dL — ABNORMAL HIGH (ref 70–99)
Potassium: 3.6 mmol/L (ref 3.5–5.1)
Sodium: 137 mmol/L (ref 135–145)

## 2014-02-22 MED ORDER — CYCLOBENZAPRINE HCL 5 MG PO TABS
7.5000 mg | ORAL_TABLET | Freq: Three times a day (TID) | ORAL | Status: DC | PRN
  Administered 2014-02-22 – 2014-02-23 (×2): 7.5 mg via ORAL
  Filled 2014-02-22 (×4): qty 1.5

## 2014-02-22 MED ORDER — OXYCODONE-ACETAMINOPHEN 5-325 MG PO TABS
1.0000 | ORAL_TABLET | ORAL | Status: DC | PRN
  Filled 2014-02-22: qty 1

## 2014-02-22 NOTE — Progress Notes (Signed)
Physical Therapy Treatment Patient Details Name: [name] MRN: [id number] DOB: Oct 09, 1933 Today's Date: 02/22/2014    History of Present Illness s/p L TKA     PT Comments    Patient reports that he does not feel well, feels like he needs  A BM. Slow progress, will see how he progresses, may benefit from snf.  Follow Up Recommendations  Home health PT;Supervision/Assistance - 24 hour     Equipment Recommendations  Rolling walker with 5" wheels    Recommendations for Other Services       Precautions / Restrictions Precautions Precautions: Knee;Fall Required Braces or Orthoses: Knee Immobilizer - Right Knee Immobilizer - Right: Discontinue once straight leg raise with < 10 degree lag    Mobility  Bed Mobility Overal bed mobility: Needs Assistance Bed Mobility: Supine to Sit;Sit to Supine     Supine to sit: Min assist;HOB elevated Sit to supine: Min assist   General bed mobility comments: with RLE and cues for sequencing, HOB raised, wife present  Transfers                 General transfer comment: cues for RW safety and sequencing , R leg position prior to sitting, hand palcement. sit/stand from bed,BSC and to   with extra pad for height, extra time, slow sequrncing.  Ambulation/Gait                 Stairs            Wheelchair Mobility    Modified Rankin (Stroke Patients Only)       Balance                                    Cognition Arousal/Alertness: Awake/alert                          Exercises Total Joint Exercises Heel Slides: AAROM;Supine;Left;10 reps Straight Leg Raises: AAROM;Left;10 reps;Supine    General Comments        Pertinent Vitals/Pain Pain Score: 1  Pain Location: R knee Pain Descriptors / Indicators: Aching    Home Living                      Prior Function            PT Goals (current goals can now be found in the care plan section) Progress towards PT  goals: Progressing toward goals    Frequency  7X/week    PT Plan Current plan remains appropriate    Co-evaluation             End of Session Equipment Utilized During Treatment: Right knee immobilizer Activity Tolerance: Patient tolerated treatment well Patient left: in bed;with call bell/phone within reach;with family/visitor present     Time: 1300-1350 PT Time Calculation (min) (ACUTE ONLY): 50 min  Charges:  $Gait Training: 8-22 mins $Therapeutic Exercise: 8-22 mins $Self Care/Home Management: 10/22/22                    G Codes:      Claretha Cooper 02/22/2014, 1:59 PM

## 2014-02-22 NOTE — Progress Notes (Signed)
Pt was complaining of "not feeling right." Patient was warm to touch, but temperature was 98.9. BP 170/81. Night time losartan given early. Patient's pain addressed and comfort changes made to make the patient more comfortable. Will continue to monitor.

## 2014-02-22 NOTE — Progress Notes (Signed)
OT Cancellation Note  Patient Details Name: [name] MRN: [id number] DOB: 1933-09-24   Cancelled Treatment:    Reason Eval/Treat Not Completed: Fatigue/lethargy limiting ability to participate . Explained role of OT to wife. Pt sleeping soundly and wife felt he needed to rest. Will recheck on pt later in day or next day. Betsy Pries 02/22/2014, 12:14 PM

## 2014-02-22 NOTE — Progress Notes (Signed)
Subjective: 1 Day Post-Op Procedure(s) (LRB): LEFT TOTAL KNEE ARTHROPLASTY (Left) Patient reports pain as moderate.   Patient seen in rounds with Dr. Gladstone Lighter. Patient is well, but has had some minor complaints of hiccups. He reports that he has had issues with hiccups all night. His wife says that she has been giving him peanut butter and they feel that is helping. He reports that he is having some pain but it is less than his preoperative pain. No signifcant issues overnight. No SOB or chest pain. He started with some therapy yesterday and was able to walk about 10 feet. Plan is to go Home after hospital stay.  Objective: Vital signs in last 24 hours: Temp:  [97.2 F (36.2 C)-98.9 F (37.2 C)] 98.9 F (37.2 C) (01/13 0630) Pulse Rate:  [75-98] 80 (01/13 0630) Resp:  [12-24] 18 (01/13 0741) BP: (130-164)/(69-90) 133/80 mmHg (01/13 0630) SpO2:  [96 %-100 %] 98 % (01/13 0741)  Intake/Output from previous day:  Intake/Output Summary (Last 24 hours) at 02/22/14 0820 Last data filed at 02/22/14 0630  Gross per 24 hour  Intake 3496.66 ml  Output   4190 ml  Net -693.34 ml     Labs:  Recent Labs  02/22/14 0435  HGB 12.7*    Recent Labs  02/22/14 0435  WBC 7.1  RBC 4.17*  HCT 38.4*  PLT 163    Recent Labs  02/22/14 0435  NA 137  K 3.6  CL 106  CO2 26  BUN 11  CREATININE 0.77  GLUCOSE 133*  CALCIUM 8.5    EXAM General - Patient is Alert and Oriented Extremity - Neurologically intact Intact pulses distally Dorsiflexion/Plantar flexion intact Compartment soft Dressing - dressing C/D/I Motor Function - intact, moving foot and toes well on exam.  Hemovac pulled without difficulty. Dressing changed over drain site.   Past Medical History  Diagnosis Date  . Hypertension   . Heart murmur   . BPH (benign prostatic hypertrophy)   . GERD (gastroesophageal reflux disease)   . Anxiety   . Headache(784.0)     relieved with OTC's  . Dementia     early per  pt- recently evaluated at [facility]- eccho7/13  , MRI head EPIC 6/13  . Bruises easily   . Unsteady gait   . Hyperlipidemia   . HBP (high blood pressure)   . Allergic rhinitis   . AI (aortic insufficiency)     Mild to Mod, Moderate AS Echo 02/2011  . Chronic fatigue   . Left carotid bruit 03/2008    w minimal obstruction on doppler  . OAB (overactive bladder)   . Dementia, vascular     Dr [name]  . Coronary artery disease     s/p PCI of left cir and LAD 2005 and PCI of prox PDA 7/09  . Stroke     possible   . Sleep apnea     STOP BANG SCORE 5 pt states was recently tested stated tested negative  . Shortness of breath dyspnea     with ADLs  . Arthritis     Assessment/Plan: 1 Day Post-Op Procedure(s) (LRB): LEFT TOTAL KNEE ARTHROPLASTY (Left) Active Problems:   H/O total knee replacement  Estimated body mass index is 32.23 kg/(m^2) as calculated from the following:   Height as of this encounter: 6\' 2"  (1.88 m).   Weight as of this encounter: 113.91 kg (251 lb 2 oz). Advance diet Up with therapy D/C IV fluids when tolerating POs well  DVT Prophylaxis - Xarelto and SCDs Weight-Bearing as tolerated  D/C O2 and Pulse OX and try on Room Air  He is doing well. Continue with therapy today. Will reinforce dressing over HV site PRN. Will switch from Robaxin to Flexeril to try to diminish hiccups. Probable DC home tomorrow with HHPT.  Ardeen Jourdain, PA-C Orthopaedic Surgery 02/22/2014, 8:20 AM

## 2014-02-22 NOTE — Discharge Instructions (Addendum)
°  Walk with your walker. Weight bearing as tolerated [place] will follow you at home for your therapy  Do not remove your dressing unless it appears compromised.  Shower only, no tub bath. Call if any temperatures greater than 101 or any wound complications: [phone] during the day and ask for Dr. Charlestine Night nurse, Brunilda Payor.

## 2014-02-22 NOTE — Care Management Note (Signed)
    Page 1 of 2   02/22/2014     4:20:03 PM CARE MANAGEMENT NOTE 02/22/2014  Patient:  [name], [name]   Account Number:  [id number]  Date Initiated:  02/22/2014  Documentation initiated by:  [facility]  Subjective/Objective Assessment:   adm: LEFT TOTAL KNEE ARTHROPLASTY (Left)     Action/Plan:   discharge planning   Anticipated DC Date:  02/23/2014   Anticipated DC Plan:  [place]  CM consult      [facility] Choice  HOME HEALTH   Choice offered to / List presented to:  C-1 Patient   DME arranged  [employer]      DME agency  [place] arranged  HH-2 PT      [place]   Status of service:  Completed, signed off Medicare Important Message given?   (If response is "NO", the following Medicare IM given date fields will be blank) Date Medicare IM given:   Medicare IM given by:   Date Additional Medicare IM given:   Additional Medicare IM given by:    Discharge Disposition:  [place]  Per UR Regulation:    If discussed at Long Length of Stay Meetings, dates discussed:    Comments:  02/22/14 09:15 Cm met with pt in room to offer choice of home health agency.  Pt chooses [facility] to render HHPT. Address and contact information verified with pt.  Referral called to [facility] rep, Kristen.   CM arranged with [facility] to supply the rolling walker.  CM has made 4 calls throughout the day for delivery of DME for this pt.  Katherine Mantle states is is on the way.  Orders, PT eval, Facesheet progress note faxed per Kineshia's request to [facility].  Will continue to monitor for rolling walker delivery. Mariane Masters, BSN, CM [phone].

## 2014-02-22 NOTE — Progress Notes (Signed)
Physical Therapy Treatment Patient Details Name: [name] MRN: [id number] DOB: 19-Oct-1933 Today's Date: 02/22/2014    History of Present Illness s/p L TKA     PT Comments    Audible wheezing, RN aware. Patient improved in mobility today. Does require 1 person assist for safety and for balance. Plans for Dc home . Wife present and very capable of assisting patient at home. Recommend RW, as 4 wheeled will be too wobbly.  Follow Up Recommendations  Home health PT;Supervision/Assistance - 24 hour     Equipment Recommendations  Rolling walker with 5" wheels    Recommendations for Other Services       Precautions / Restrictions Precautions Precautions: Knee;Fall Required Braces or Orthoses: Knee Immobilizer - Right Knee Immobilizer - Right: Discontinue once straight leg raise with < 10 degree lag Restrictions Weight Bearing Restrictions: No    Mobility  Bed Mobility Overal bed mobility: Needs Assistance Bed Mobility: Supine to Sit     Supine to sit: Min assist;HOB elevated     General bed mobility comments: with RLE and cues for sequencing, HOB raised, wife present  Transfers Overall transfer level: Needs assistance Equipment used: Rolling walker (2 wheeled) Transfers: Sit to/from Stand Sit to Stand: Min assist         General transfer comment: cues for RW safety and sequencing , R leg position prior to sitting, hand palcement. sit/stand from bed,BSC and to recliner  with extra pad for height, extra time, slow sequrncing.  Ambulation/Gait Ambulation/Gait assistance: Min assist Ambulation Distance (Feet): 10 Feet (then 55'.) Assistive device: Rolling walker (2 wheeled) Gait Pattern/deviations: Step-to pattern;Step-through pattern;Trunk flexed Gait velocity: slow   General Gait Details: constant cues for sequencing and pt with slighly nauseated and light headed during session. Felt better once returned to chair.    Stairs            Wheelchair  Mobility    Modified Rankin (Stroke Patients Only)       Balance Overall balance assessment: Needs assistance         Standing balance support: Bilateral upper extremity supported;During functional activity Standing balance-Leahy Scale: Poor Standing balance comment: needs  to hold onto rail for depends to be pulled up                    Cognition Arousal/Alertness: Awake/alert Behavior During Therapy: [facility] for tasks assessed/performed Overall Cognitive Status: Within Functional Limits for tasks assessed                      Exercises Total Joint Exercises Ankle Circles/Pumps: AROM;Both;10 reps;Supine Quad Sets: Left;Supine;10 reps;AAROM Straight Leg Raises: AAROM;Right;10 reps;Supine Knee Flexion: AAROM;Right;10 reps;Supine    General Comments        Pertinent Vitals/Pain Pain Score: 2  Pain Location: R knee after walk, instructed in repositioning. Pain Descriptors / Indicators: Aching Pain Intervention(s): Monitored during session;Premedicated before session;Ice applied;Repositioned    Home Living                      Prior Function            PT Goals (current goals can now be found in the care plan section) Progress towards PT goals: Progressing toward goals    Frequency  7X/week    PT Plan Current plan remains appropriate    Co-evaluation             End of Session Equipment Utilized During Treatment:  Right knee immobilizer Activity Tolerance: Patient tolerated treatment well Patient left: in chair;with call bell/phone within reach;with family/visitor present     Time: 9357-0177 PT Time Calculation (min) (ACUTE ONLY): 61 min  Charges:  $Gait Training: 23-37 mins $Therapeutic Exercise: 8-22 mins $Self Care/Home Management: 8-22                    G Codes:      Claretha Cooper 02/22/2014, 9:46 AM Tresa Endo PT [phone]

## 2014-02-23 LAB — BASIC METABOLIC PANEL
ANION GAP: 5 (ref 5–15)
BUN: 13 mg/dL (ref 6–23)
CALCIUM: 8 mg/dL — AB (ref 8.4–10.5)
CO2: 23 mmol/L (ref 19–32)
Chloride: 101 mEq/L (ref 96–112)
Creatinine, Ser: 0.74 mg/dL (ref 0.50–1.35)
GFR calc Af Amer: >90 mL/min (ref >90)
GFR, EST NON AFRICAN AMERICAN: 86 mL/min — AB (ref >90)
Glucose, Bld: 130 mg/dL — ABNORMAL HIGH (ref 70–99)
POTASSIUM: 3.6 mmol/L (ref 3.5–5.1)
SODIUM: 129 mmol/L — AB (ref 135–145)

## 2014-02-23 LAB — CBC
HCT: 36.7 % — ABNORMAL LOW (ref 39.0–52.0)
Hemoglobin: 12.6 g/dL — ABNORMAL LOW (ref 13.0–17.0)
MCH: 31.7 pg (ref 26.0–34.0)
MCHC: 34.3 g/dL (ref 30.0–36.0)
MCV: 92.2 fL (ref 78.0–100.0)
Platelets: 159 10*3/uL (ref 150–400)
RBC: 3.98 MIL/uL — ABNORMAL LOW (ref 4.22–5.81)
RDW: 13.6 % (ref 11.5–15.5)
WBC: 10.4 10*3/uL (ref 4.0–10.5)

## 2014-02-23 MED ORDER — OXYCODONE-ACETAMINOPHEN 5-325 MG PO TABS: 1.0000 | ORAL_TABLET | ORAL | Status: DC | PRN

## 2014-02-23 MED ORDER — CYCLOBENZAPRINE HCL 5 MG PO TABS: 5.0000 mg | ORAL_TABLET | Freq: Three times a day (TID) | ORAL | Status: DC | PRN

## 2014-02-23 NOTE — Evaluation (Signed)
Occupational Therapy Evaluation Patient Details Name: [name] MRN: [id number] DOB: 09-08-1933 Today's Date: 02/23/2014    History of Present Illness s/p L TKA    Clinical Impression   Pt is s/p TKA resulting in the deficits listed below (see OT Problem List).  Pt will benefit from skilled OT to increase their safety and independence with ADL and functional mobility for ADL to facilitate discharge to venue listed below.        Follow Up Recommendations  Home health OT    Equipment Recommendations  None recommended by OT    Recommendations for Other Services       Precautions / Restrictions Precautions Precautions: Knee;Fall Required Braces or Orthoses: Knee Immobilizer - Right Knee Immobilizer - Right: Discontinue once straight leg raise with < 10 degree lag      Mobility Bed Mobility Overal bed mobility: Needs Assistance Bed Mobility: Supine to Sit;Sit to Supine     Supine to sit: Min assist;HOB elevated     General bed mobility comments: decreased initiation, extra time to complete activity, slow to respond, allowed use of rail as per wife, pt. will sleep in recliner most of the time, facilitation to scoot hips to edge of bed. did not use KI, assist wi th L leg  Transfers Overall transfer level: Needs assistance Equipment used: Rolling walker (2 wheeled) Transfers: Sit to/from Stand Sit to Stand: Min assist;From elevated surface         General transfer comment: cues for RW safety and sequencing , L leg position prior to sitting, hand palcement. , L leg position, frequent cues to position body safely in front of recliner prior to reaching to arm rests. wife and daughter present. /Very slow processing.    Balance           Standing balance support: Bilateral upper extremity supported;During functional activity Standing balance-Leahy Scale: Poor                              ADL Overall ADL's : Needs assistance/impaired Eating/Feeding:  Set up;Sitting   Grooming: Minimal assistance;Sitting   Upper Body Bathing: Moderate assistance;Sitting   Lower Body Bathing: Sit to/from stand;Maximal assistance   Upper Body Dressing : Minimal assistance;Sitting   Lower Body Dressing: Sit to/from stand;Maximal assistance   Toilet Transfer: Moderate assistance Toilet Transfer Details (indicate cue type and reason): sit to stand only         Functional mobility during ADLs: Maximal assistance General ADL Comments: daugther will A upon DC     Vision                     Perception     Praxis      Pertinent Vitals/Pain Pain Assessment: Faces Pain Score: 3  Faces Pain Scale: Hurts little more Pain Location: l knee Pain Descriptors / Indicators: Sore Pain Intervention(s): Monitored during session;Repositioned;Ice applied     Hand Dominance     Extremity/Trunk Assessment Upper Extremity Assessment Upper Extremity Assessment: Generalized weakness           Communication Communication Communication: No difficulties (some expressive difficulties (word retrieval) but pt's and wife stated this has been this way  since he was little)   Cognition Arousal/Alertness: Lethargic;Suspect due to medications Behavior During Therapy: [facility] for tasks assessed/performed Overall Cognitive Status: Within Functional Limits for tasks assessed       Memory: Decreased recall of precautions  General Comments       Exercises       Shoulder Instructions      Home Living Family/patient expects to be discharged to:: Private residence Living Arrangements: Spouse/significant other Available Help at Discharge: Family Type of Home: House Home Access: Stairs to enter Technical brewer of Steps: 2 Entrance Stairs-Rails: Can reach both Home Layout: One level               Home Equipment: [place] - 4 wheels;Cane - single point;Electric scooter          Prior Functioning/Environment Level of  Independence: Independent with assistive device(s)        Comments: pt used cane mostly or the walls and funriture     OT Diagnosis: Generalized weakness   OT Problem List: Decreased strength;Decreased activity tolerance;Impaired balance (sitting and/or standing)   OT Treatment/Interventions: Self-care/ADL training;DME and/or AE instruction;Patient/family education    OT Goals(Current goals can be found in the care plan section) Acute Rehab OT Goals Patient Stated Goal: I want to be able to go home from here. OT Goal Formulation: With patient Time For Goal Achievement: 03/09/14  OT Frequency: Min 2X/week    End of Session Equipment Utilized During Treatment: Rolling walker  Activity Tolerance: Patient limited by fatigue Patient left: in chair;with call bell/phone within reach;with family/visitor present   Time: 2130-8657 OT Time Calculation (min): 34 min Charges:  OT General Charges $OT Visit: 1 Procedure OT Evaluation $Initial OT Evaluation Tier I: 1 Procedure OT Treatments $Self Care/Home Management : 23-37 mins G-Codes:    Payton Mccallum D 03/21/2014, 11:29 AM

## 2014-02-23 NOTE — Progress Notes (Addendum)
Physical Therapy Treatment Patient Details Name: [name] MRN: [id number] DOB: 09/21/1933 Today's Date: 02/23/2014    History of Present Illness s/p L TKA     PT Comments    Patient unsteady, fatigues easily. Patient's family reports this is his near baseline and they feel able to care for patient at home. Patient has assist for going up steps by son in law. RN notified of family preference.   Follow Up Recommendations  Home health PT;Supervision/Assistance - 24 hour     Equipment Recommendations  Rolling walker with 5" wheels    Recommendations for Other Services       Precautions / Restrictions Precautions Precautions: Knee;Fall Required Braces or Orthoses: Knee Immobilizer - Left Knee Immobilizer - Right: Discontinue once straight leg raise with < 10 degree lag    Mobility  Bed Mobility                  Transfers Overall transfer level: Needs assistance Equipment used: Rolling walker (2 wheeled) Transfers: Sit to/from Stand Sit to Stand: Mod assist         General transfer comment: from recliner, 2 trials to get up, had to place KI on L for safety, control of descent  Ambulation/Gait Ambulation/Gait assistance: Min assist Ambulation Distance (Feet): 8 Feet Assistive device: Rolling walker (2 wheeled) Gait Pattern/deviations: Step-to pattern;Trunk flexed Gait velocity: slow   General Gait Details: constant cues for sequencing, posture, gradually fleses at trunk, as he fatigues..    Stairs Stairs: Yes Stairs assistance: +2 safety/equipment Stair Management: Two rails;Step to pattern;Forwards Number of Stairs: 2 General stair comments: daughter and wife present  Wheelchair Mobility    Modified Rankin (Stroke Patients Only)       Balance           Standing balance support: During functional activity;Bilateral upper extremity supported Standing balance-Leahy Scale: Poor Standing balance comment: lost balance after going down 3  steps,                     Cognition Arousal/Alertness: Lethargic;Suspect due to medications Behavior During Therapy: WFL for tasks assessed/performed Overall Cognitive Status: Within Functional Limits for tasks assessed       Memory: Decreased recall of precautions              Exercises      General Comments        Pertinent Vitals/Pain Pain Assessment: Faces Pain Score: 3  Faces Pain Scale: Hurts little more Pain Location: L knee Pain Descriptors / Indicators: Sore Pain Intervention(s): Monitored during session;Repositioned;Ice applied    Home Living Family/patient expects to be discharged to:: Private residence Living Arrangements: Spouse/significant other Available Help at Discharge: Family Type of Home: House Home Access: Stairs to enter Entrance Stairs-Rails: Can reach both Home Layout: One level Home Equipment: Environmental consultant - 4 wheels;Cane - single point;Electric scooter      Prior Function Level of Independence: Independent with assistive device(s)      Comments: pt used cane mostly or the walls and funriture    PT Goals (current goals can now be found in the care plan section) Acute Rehab PT Goals Patient Stated Goal: I want to be able to go home from here. Progress towards PT goals: Progressing toward goals    Frequency  7X/week    PT Plan Current plan remains appropriate    Co-evaluation             End of Session Equipment Utilized During  Treatment: Left knee immobilizer Activity Tolerance: Patient limited by fatigue  Left in recliner.       Time: 9093-1121 PT Time Calculation (min) (ACUTE ONLY): 36 min  Charges:  $Gait Training: 23-37 mins                    G Codes:      Claretha Cooper 02/23/2014, 2:29 PM  Tresa Endo PT [phone]

## 2014-02-23 NOTE — Progress Notes (Signed)
Subjective: 2 Days Post-Op Procedure(s) (LRB): LEFT TOTAL KNEE ARTHROPLASTY (Left) Patient reports pain as 3 on 0-10 scale.Doing Well. Will DC    Objective: Vital signs in last 24 hours: Temp:  [98 F (36.7 C)-99.5 F (37.5 C)] 98.9 F (37.2 C) (01/14 0557) Pulse Rate:  [69-94] 94 (01/14 0557) Resp:  [16-20] 18 (01/14 0557) BP: (109-170)/(59-81) 136/71 mmHg (01/14 0557) SpO2:  [93 %-98 %] 94 % (01/14 0557)  Intake/Output from previous day: 01/13 0701 - 01/14 0700 In: 940 [P.O.:770; I.V.:170] Out: 350 [Urine:350] Intake/Output this shift:     Recent Labs  02/22/14 0435 02/23/14 0420  HGB 12.7* 12.6*    Recent Labs  02/22/14 0435 02/23/14 0420  WBC 7.1 10.4  RBC 4.17* 3.98*  HCT 38.4* 36.7*  PLT 163 159    Recent Labs  02/22/14 0435 02/23/14 0420  NA 137 129*  K 3.6 3.6  CL 106 101  CO2 26 23  BUN 11 13  CREATININE 0.77 0.74  GLUCOSE 133* 130*  CALCIUM 8.5 8.0*   No results for input(s): LABPT, INR in the last 72 hours.  Dorsiflexion/Plantar flexion intact  Assessment/Plan: 2 Days Post-Op Procedure(s) (LRB): LEFT TOTAL KNEE ARTHROPLASTY (Left) Discharge home with home health  Gracin Mcpartland A 02/23/2014, 7:24 AM

## 2014-02-23 NOTE — Progress Notes (Signed)
Physical Therapy Treatment Patient Details Name: [name] MRN: [id number] DOB: Aug 27, 1933 Today's Date: 02/23/2014    History of Present Illness s/p L TKA     PT Comments    Patient's progress slow, fatigues very easily, sats 94% RA after ambulating x 50'. Wife and daughter present and still prefer DC to home.  Patient  Ambulated without KI and did well, L KNee supportive. Advised family to use KI if concerns for L knee buckling. Will return t practice steps prior to DC.   Follow Up Recommendations  Home health PT;Supervision/Assistance - 24 hour     Equipment Recommendations  Rolling walker with 5" wheels    Recommendations for Other Services       Precautions / Restrictions Precautions Precautions: Knee;Fall Required Braces or Orthoses: Knee Immobilizer - Right Knee Immobilizer - Right: Discontinue once straight leg raise with < 10 degree lag    Mobility  Bed Mobility Overal bed mobility: Needs Assistance Bed Mobility: Supine to Sit;Sit to Supine     Supine to sit: Min assist;HOB elevated     General bed mobility comments: decreased initiation, extra time to complete activity, slow to respond, allowed use of rail as per wife, pt. will sleep in recliner most of the time, facilitation to scoot hips to edge of bed. did not use KI, assist wi th L leg  Transfers Overall transfer level: Needs assistance Equipment used: Rolling walker (2 wheeled) Transfers: Sit to/from Stand Sit to Stand: Min assist;From elevated surface         General transfer comment: cues for RW safety and sequencing , L leg position prior to sitting, hand palcement. , L leg position, frequent cues to position body safely in front of recliner prior to reaching to arm rests. wife and daughter present. /Very slow processing.  Ambulation/Gait Ambulation/Gait assistance: Min assist Ambulation Distance (Feet): 50 Feet Assistive device: Rolling walker (2 wheeled) Gait Pattern/deviations:  Step-through pattern;Step-to pattern;Trunk flexed Gait velocity: slow   General Gait Details: constant cues for sequencing, posture, gradually fleses at trunk, as he fatigues..    Stairs            Wheelchair Mobility    Modified Rankin (Stroke Patients Only)       Balance           Standing balance support: Bilateral upper extremity supported;During functional activity Standing balance-Leahy Scale: Poor                      Cognition Arousal/Alertness: Lethargic;Suspect due to medications Behavior During Therapy: [facility] for tasks assessed/performed Overall Cognitive Status: Within Functional Limits for tasks assessed       Memory: Decreased recall of precautions              Exercises Total Joint Exercises Ankle Circles/Pumps: AROM;Both;10 reps;Supine Quad Sets: Left;Supine;10 reps;AAROM Heel Slides: AAROM;Supine;Left;10 reps Straight Leg Raises: AAROM;Left;10 reps;Supine Knee Flexion: AAROM;Right;10 reps;Supine Goniometric ROM: 10-70 sitting L knee.    General Comments        Pertinent Vitals/Pain Pain Score: 3  Pain Location: L knee after ambulation Pain Descriptors / Indicators: Aching    Home Living                      Prior Function            PT Goals (current goals can now be found in the care plan section) Progress towards PT goals: Progressing toward goals (slowly)  Frequency  7X/week    PT Plan Current plan remains appropriate    Co-evaluation             End of Session   Activity Tolerance: Patient limited by fatigue;Patient limited by lethargy Patient left: in chair;with call bell/phone within reach;with family/visitor present     Time: 9201-0071 PT Time Calculation (min) (ACUTE ONLY): 60 min  Charges:  $Gait Training: 23-37 mins $Therapeutic Exercise: 8-22 mins $Self Care/Home Management: 8-22                    G Codes:      Claretha Cooper 02/23/2014, 9:55 AM Tresa Endo  PT [phone]

## 2014-02-23 NOTE — Progress Notes (Signed)
RN reviewed discharge instructions with patients wife, and daughter. All questions answered.   Paperwork and prescriptions given.   NT rolled patient down in wheelchair to family car.

## 2014-02-24 DIAGNOSIS — I2510 Atherosclerotic heart disease of native coronary artery without angina pectoris: Secondary | ICD-10-CM | POA: Diagnosis not present

## 2014-02-24 DIAGNOSIS — M150 Primary generalized (osteo)arthritis: Secondary | ICD-10-CM | POA: Diagnosis not present

## 2014-02-24 DIAGNOSIS — Z471 Aftercare following joint replacement surgery: Secondary | ICD-10-CM | POA: Diagnosis not present

## 2014-02-24 DIAGNOSIS — F0390 Unspecified dementia without behavioral disturbance: Secondary | ICD-10-CM | POA: Diagnosis not present

## 2014-02-24 DIAGNOSIS — I10 Essential (primary) hypertension: Secondary | ICD-10-CM | POA: Diagnosis not present

## 2014-02-24 DIAGNOSIS — Z96652 Presence of left artificial knee joint: Secondary | ICD-10-CM | POA: Diagnosis not present

## 2014-02-24 DIAGNOSIS — E781 Pure hyperglyceridemia: Secondary | ICD-10-CM | POA: Diagnosis not present

## 2014-02-25 DIAGNOSIS — F0390 Unspecified dementia without behavioral disturbance: Secondary | ICD-10-CM | POA: Diagnosis not present

## 2014-02-25 DIAGNOSIS — E781 Pure hyperglyceridemia: Secondary | ICD-10-CM | POA: Diagnosis not present

## 2014-02-25 DIAGNOSIS — Z471 Aftercare following joint replacement surgery: Secondary | ICD-10-CM | POA: Diagnosis not present

## 2014-02-25 DIAGNOSIS — I2510 Atherosclerotic heart disease of native coronary artery without angina pectoris: Secondary | ICD-10-CM | POA: Diagnosis not present

## 2014-02-25 DIAGNOSIS — M150 Primary generalized (osteo)arthritis: Secondary | ICD-10-CM | POA: Diagnosis not present

## 2014-02-25 DIAGNOSIS — I10 Essential (primary) hypertension: Secondary | ICD-10-CM | POA: Diagnosis not present

## 2014-02-25 DIAGNOSIS — Z96652 Presence of left artificial knee joint: Secondary | ICD-10-CM | POA: Diagnosis not present

## 2014-02-27 NOTE — Discharge Summary (Signed)
Physician Discharge Summary   Patient ID: [name] MRN: [id number] DOB/AGE: 03-31-1933 80 y.o.  Admit date: 02/21/2014 Discharge date: 02/23/2014  Primary Diagnosis: Osteoarthritis, left knee  Admission Diagnoses:  Past Medical History  Diagnosis Date  . Hypertension   . Heart murmur   . BPH (benign prostatic hypertrophy)   . GERD (gastroesophageal reflux disease)   . Anxiety   . Headache(784.0)     relieved with OTC's  . Dementia     early per pt- recently evaluated at [facility]- eccho7/13  , MRI head EPIC 6/13  . Bruises easily   . Unsteady gait   . Hyperlipidemia   . HBP (high blood pressure)   . Allergic rhinitis   . AI (aortic insufficiency)     Mild to Mod, Moderate AS Echo 02/2011  . Chronic fatigue   . Left carotid bruit 03/2008    w minimal obstruction on doppler  . OAB (overactive bladder)   . Dementia, vascular     Dr [name]  . Coronary artery disease     s/p PCI of left cir and LAD 2005 and PCI of prox PDA 7/09  . Stroke     possible   . Sleep apnea     STOP BANG SCORE 5 pt states was recently tested stated tested negative  . Shortness of breath dyspnea     with ADLs  . Arthritis    Discharge Diagnoses:   Active Problems:   H/O total knee replacement  Estimated body mass index is 32.23 kg/(m^2) as calculated from the following:   Height as of this encounter: $RemoveBeforeD'6\' 2"'[id number]$  (1.88 m).   Weight as of this encounter: 113.91 kg (251 lb 2 oz).  Procedure:  Procedure(s) (LRB): LEFT TOTAL KNEE ARTHROPLASTY (Left)   Consults: None  HPI: [name], 81 y.o. male, has a history of pain and functional disability in the left knee due to arthritis and has failed non-surgical conservative treatments for greater than 12 weeks to includeNSAID's and/or analgesics, corticosteriod injections, viscosupplementation injections and activity modification. Onset of symptoms was gradual, starting 5 years ago with gradually worsening course since that time. The patient  noted no past surgery on the left knee(s). Patient currently rates pain in the left knee(s) at 7 out of 10 with activity. Patient has Banks pain, worsening of pain with activity and weight bearing, pain that interferes with activities of daily living, pain with passive range of motion, crepitus and joint swelling. Patient has evidence of periarticular osteophytes and joint space narrowing by imaging studies. There is no active infection.  Laboratory Data: Admission on 02/21/2014, Discharged on 02/23/2014  Component Date Value Ref Range Status  . WBC 02/22/2014 7.1  4.0 - 10.5 K/uL Final  . RBC 02/22/2014 4.17* 4.22 - 5.81 MIL/uL Final  . Hemoglobin 02/22/2014 12.7* 13.0 - 17.0 g/dL Final  . HCT 02/22/2014 38.4* 39.0 - 52.0 % Final  . MCV 02/22/2014 92.1  78.0 - 100.0 fL Final  . MCH 02/22/2014 30.5  26.0 - 34.0 pg Final  . MCHC 02/22/2014 33.1  30.0 - 36.0 g/dL Final  . RDW 02/22/2014 13.5  11.5 - 15.5 % Final  . Platelets 02/22/2014 163  150 - 400 K/uL Final  . Sodium 02/22/2014 137  135 - 145 mmol/L Final   Please note change in reference range.  . Potassium 02/22/2014 3.6  3.5 - 5.1 mmol/L Final   Please note change in reference range.  . Chloride 02/22/2014 106  96 -  112 mEq/L Final  . CO2 02/22/2014 26  19 - 32 mmol/L Final  . Glucose, Bld 02/22/2014 133* 70 - 99 mg/dL Final  . BUN 02/22/2014 11  6 - 23 mg/dL Final  . Creatinine, Ser 02/22/2014 0.77  0.50 - 1.35 mg/dL Final  . Calcium 02/22/2014 8.5  8.4 - 10.5 mg/dL Final  . GFR calc non Af Amer 02/22/2014 85* >90 mL/min Final  . GFR calc Af Amer 02/22/2014 >90  >90 mL/min Final   Comment: (NOTE) The eGFR has been calculated using the CKD EPI equation. This calculation has not been validated in all clinical situations. eGFR's persistently <90 mL/min signify possible Chronic Kidney Disease.   . Anion gap 02/22/2014 5  5 - 15 Final  . WBC 02/23/2014 10.4  4.0 - 10.5 K/uL Final  . RBC 02/23/2014 3.98* 4.22 - 5.81 MIL/uL Final    . Hemoglobin 02/23/2014 12.6* 13.0 - 17.0 g/dL Final  . HCT 02/23/2014 36.7* 39.0 - 52.0 % Final  . MCV 02/23/2014 92.2  78.0 - 100.0 fL Final  . MCH 02/23/2014 31.7  26.0 - 34.0 pg Final  . MCHC 02/23/2014 34.3  30.0 - 36.0 g/dL Final  . RDW 02/23/2014 13.6  11.5 - 15.5 % Final  . Platelets 02/23/2014 159  150 - 400 K/uL Final  . Sodium 02/23/2014 129* 135 - 145 mmol/L Final   Comment: Please note change in reference range. DELTA CHECK NOTED   . Potassium 02/23/2014 3.6  3.5 - 5.1 mmol/L Final   Please note change in reference range.  . Chloride 02/23/2014 101  96 - 112 mEq/L Final  . CO2 02/23/2014 23  19 - 32 mmol/L Final  . Glucose, Bld 02/23/2014 130* 70 - 99 mg/dL Final  . BUN 02/23/2014 13  6 - 23 mg/dL Final  . Creatinine, Ser 02/23/2014 0.74  0.50 - 1.35 mg/dL Final  . Calcium 02/23/2014 8.0* 8.4 - 10.5 mg/dL Final  . GFR calc non Af Amer 02/23/2014 86* >90 mL/min Final  . GFR calc Af Amer 02/23/2014 >90  >90 mL/min Final   Comment: (NOTE) The eGFR has been calculated using the CKD EPI equation. This calculation has not been validated in all clinical situations. eGFR's persistently <90 mL/min signify possible Chronic Kidney Disease.   Georgiann Hahn gap 02/23/2014 5  5 - 15 Final  Hospital Outpatient Visit on 02/14/2014  Component Date Value Ref Range Status  . MRSA, PCR 02/14/2014 NEGATIVE  NEGATIVE Final  . Staphylococcus aureus 02/14/2014 NEGATIVE  NEGATIVE Final   Comment:        The Xpert SA Assay (FDA approved for NASAL specimens in patients over 7 years of age), is one component of a comprehensive surveillance program.  Test performance has been validated by [employer] for patients greater than or equal to 72 year old. It is not intended to diagnose infection nor to guide or monitor treatment.   [name] Kitchen aPTT 02/14/2014 30  24 - 37 seconds Final  . WBC 02/14/2014 5.8  4.0 - 10.5 K/uL Final  . RBC 02/14/2014 4.98  4.22 - 5.81 MIL/uL Final  . Hemoglobin 02/14/2014  15.8  13.0 - 17.0 g/dL Final  . HCT 02/14/2014 46.2  39.0 - 52.0 % Final  . MCV 02/14/2014 92.8  78.0 - 100.0 fL Final  . MCH 02/14/2014 31.7  26.0 - 34.0 pg Final  . MCHC 02/14/2014 34.2  30.0 - 36.0 g/dL Final  . RDW 02/14/2014 13.5  11.5 - 15.5 %  Final  . Platelets 02/14/2014 210  150 - 400 K/uL Final  . Neutrophils Relative % 02/14/2014 71  43 - 77 % Final  . Neutro Abs 02/14/2014 4.1  1.7 - 7.7 K/uL Final  . Lymphocytes Relative 02/14/2014 19  12 - 46 % Final  . Lymphs Abs 02/14/2014 1.1  0.7 - 4.0 K/uL Final  . Monocytes Relative 02/14/2014 7  3 - 12 % Final  . Monocytes Absolute 02/14/2014 0.4  0.1 - 1.0 K/uL Final  . Eosinophils Relative 02/14/2014 3  0 - 5 % Final  . Eosinophils Absolute 02/14/2014 0.2  0.0 - 0.7 K/uL Final  . Basophils Relative 02/14/2014 0  0 - 1 % Final  . Basophils Absolute 02/14/2014 0.0  0.0 - 0.1 K/uL Final  . Sodium 02/14/2014 141  135 - 145 mmol/L Final   Please note change in reference range.  . Potassium 02/14/2014 3.8  3.5 - 5.1 mmol/L Final   Please note change in reference range.  . Chloride 02/14/2014 111  96 - 112 mEq/L Final  . CO2 02/14/2014 24  19 - 32 mmol/L Final  . Glucose, Bld 02/14/2014 113* 70 - 99 mg/dL Final  . BUN 02/14/2014 19  6 - 23 mg/dL Final  . Creatinine, Ser 02/14/2014 0.90  0.50 - 1.35 mg/dL Final  . Calcium 02/14/2014 8.7  8.4 - 10.5 mg/dL Final  . Total Protein 02/14/2014 6.7  6.0 - 8.3 g/dL Final  . Albumin 02/14/2014 4.0  3.5 - 5.2 g/dL Final  . AST 02/14/2014 31  0 - 37 U/L Final  . ALT 02/14/2014 40  0 - 53 U/L Final  . Alkaline Phosphatase 02/14/2014 117  39 - 117 U/L Final  . Total Bilirubin 02/14/2014 1.3* 0.3 - 1.2 mg/dL Final  . GFR calc non Af Amer 02/14/2014 79* >90 mL/min Final  . GFR calc Af Amer 02/14/2014 >90  >90 mL/min Final   Comment: (NOTE) The eGFR has been calculated using the CKD EPI equation. This calculation has not been validated in all clinical situations. eGFR's persistently <90 mL/min  signify possible Chronic Kidney Disease.   . Anion gap 02/14/2014 6  5 - 15 Final  . Prothrombin Time 02/14/2014 13.3  11.6 - 15.2 seconds Final  . INR 02/14/2014 1.00  0.00 - 1.49 Final  . ABO/RH(D) 02/14/2014 O POS   Final  . Antibody Screen 02/14/2014 NEG   Final  . Sample Expiration 02/14/2014 02/24/2014   Final  . Color, Urine 02/14/2014 Brandley Aldrete* YELLOW Final   BIOCHEMICALS MAY BE AFFECTED BY COLOR  . APPearance 02/14/2014 CLEAR  CLEAR Final  . Specific Gravity, Urine 02/14/2014 1.029  1.005 - 1.030 Final  . pH 02/14/2014 6.0  5.0 - 8.0 Final  . Glucose, UA 02/14/2014 NEGATIVE  NEGATIVE mg/dL Final  . Hgb urine dipstick 02/14/2014 NEGATIVE  NEGATIVE Final  . Bilirubin Urine 02/14/2014 NEGATIVE  NEGATIVE Final  . Ketones, ur 02/14/2014 NEGATIVE  NEGATIVE mg/dL Final  . Protein, ur 02/14/2014 NEGATIVE  NEGATIVE mg/dL Final  . Urobilinogen, UA 02/14/2014 1.0  0.0 - 1.0 mg/dL Final  . Nitrite 02/14/2014 NEGATIVE  NEGATIVE Final  . Leukocytes, UA 02/14/2014 NEGATIVE  NEGATIVE Final   MICROSCOPIC NOT DONE ON URINES WITH NEGATIVE PROTEIN, BLOOD, LEUKOCYTES, NITRITE, OR GLUCOSE <1000 mg/dL.  . ABO/RH(D) 02/14/2014 O POS   Final  Appointment on 01/13/2014  Component Date Value Ref Range Status  . Cholesterol 01/13/2014 148  0 - 200 mg/dL Final   ATP  III Classification       Desirable:  < 200 mg/dL               Borderline High:  200 - 239 mg/dL          High:  > = 240 mg/dL  . Triglycerides 01/13/2014 112.0  0.0 - 149.0 mg/dL Final   Normal:  <150 mg/dLBorderline High:  150 - 199 mg/dL  . HDL 01/13/2014 45.20  >39.00 mg/dL Final  . VLDL 01/13/2014 22.4  0.0 - 40.0 mg/dL Final  . LDL Cholesterol 01/13/2014 80  0 - 99 mg/dL Final  . Total CHOL/HDL Ratio 01/13/2014 3   Final                  Men          Women1/2 Average Risk     3.4          3.3Average Risk          5.0          4.42X Average Risk          9.6          7.13X Average Risk          15.0          11.0                      .  NonHDL 01/13/2014 102.80   Final   NOTE:  Non-HDL goal should be 30 mg/dL higher than patient's LDL goal (i.e. LDL goal of < 70 mg/dL, would have non-HDL goal of < 100 mg/dL)  . ALT 01/13/2014 36  0 - 53 U/L Final     X-Rays:Dg Chest 2 View  02/14/2014   CLINICAL DATA:  Preop for left knee surgery, former smoking history  EXAM: CHEST  2 VIEW  COMPARISON:  Chest x-ray of 02/07/2014  FINDINGS: No active infiltrate or effusion is seen. The heart is mildly enlarged and stable. No bony abnormality is seen.  IMPRESSION: No active cardiopulmonary disease.  Stable mild cardiomegaly.   Electronically Signed   By: Ivar Drape M.D.   On: 02/14/2014 17:24   Dg Chest 2 View  02/07/2014   CLINICAL DATA:  Several week history of cough and shortness of breath ; history of coronary artery disease and aortic stenosis  EXAM: CHEST  2 VIEW  COMPARISON:  PA and lateral chest x-ray of March 19, 2013 and PA and lateral chest of May 06, 2011.  FINDINGS: The lungs are reasonably well inflated. There is no focal infiltrate. There is no pleural effusion. There is stable subcentimeter areas of nodularity peripherally in the left upper lobe. The cardiac silhouette is top-normal in size. The pulmonary vascularity is not engorged. There is mild tortuosity of the descending thoracic aorta. The trachea is midline. The bony thorax is unremarkable.  IMPRESSION: There is mild stable enlargement of the cardiac silhouette without pulmonary vascular congestion. There is no evidence of pneumonia nor pulmonary edema.   Electronically Signed   By: David  Martinique   On: 02/07/2014 10:37    EKG: Orders placed or performed during the hospital encounter of 02/14/14  . EKG  . EKG     Hospital Course: [name] is a 81 y.o. who was admitted to [facility]. They were brought to the operating room on 02/21/2014 and underwent Procedure(s): LEFT TOTAL KNEE ARTHROPLASTY.  Patient tolerated the procedure well and was  later transferred  to the recovery room and then to the orthopaedic floor for postoperative care.  They were given PO and IV analgesics for pain control following their surgery.  They were given 24 hours of postoperative antibiotics of  Anti-infectives    Start     Dose/Rate Route Frequency Ordered Stop   02/21/14 1400  ceFAZolin (ANCEF) IVPB 1 g/50 mL premix     1 g100 mL/hr over 30 Minutes Intravenous Every 6 hours 02/21/14 1150 02/21/14 2051   02/21/14 0751  polymyxin B 500,000 Units, bacitracin 50,000 Units in sodium chloride irrigation 0.9 % 500 mL irrigation  Status:  Discontinued       As needed 02/21/14 0751 02/21/14 0950   02/21/14 0534  ceFAZolin (ANCEF) IVPB 2 g/50 mL premix     2 g100 mL/hr over 30 Minutes Intravenous On call to O.R. 02/21/14 9629 02/21/14 0720     and started on DVT prophylaxis in the form of Xarelto.   PT and OT were ordered for total joint protocol.  Discharge planning consulted to help with postop disposition and equipment needs.  Patient had a fair Banks on the evening of surgery.  They started to get up OOB with therapy on day one. Hemovac drain was pulled without difficulty.  Continued to work with therapy into day two.  The patient had progressed with therapy and meeting their goals.  Incision was healing well.  Patient was seen in rounds and was ready to go home.   Diet: Cardiac diet Activity:WBAT Follow-up:in 2 weeks Disposition - Home Discharged Condition: stable   Discharge Instructions    Call MD / Call 911    Complete by:  As directed   If you experience chest pain or shortness of breath, CALL 911 and be transported to the hospital emergency room.  If you develope a fever above 101 F, pus (white drainage) or increased drainage or redness at the wound, or calf pain, call your surgeon's office.     Constipation Prevention    Complete by:  As directed   Drink plenty of fluids.  Prune juice may be helpful.  You may use a stool softener, such as Colace (over the counter)  100 mg twice a day.  Use MiraLax (over the counter) for constipation as needed.     Diet - low sodium heart healthy    Complete by:  As directed      Discharge instructions    Complete by:  As directed   Walk with your walker. Weight bearing as tolerated [facility] will follow you at home for your therapy  Do not remove your dressing unless it appears compromised.  Shower only, no tub bath. Call if any temperatures greater than 101 or any wound complications: [phone] during the day and ask for [name] nurse, [name].     Do not put a pillow under the knee. Place it under the heel.    Complete by:  As directed      Driving restrictions    Complete by:  As directed   No driving     Increase activity slowly as tolerated    Complete by:  As directed             Medication List    STOP taking these medications        acetaminophen 500 MG tablet  Commonly known as:  TYLENOL      TAKE these medications  amLODipine 5 MG tablet  Commonly known as:  NORVASC  Take 5 mg by mouth every morning.     aspirin 81 MG tablet  Take 81 mg by mouth daily.     carvedilol 12.5 MG tablet  Commonly known as:  COREG  Take 12.5 mg by mouth 2 (two) times daily with a meal.     clopidogrel 75 MG tablet  Commonly known as:  PLAVIX  Take 1 tablet (75 mg total) by mouth daily.     cyclobenzaprine 5 MG tablet  Commonly known as:  FLEXERIL  Take 1 tablet (5 mg total) by mouth 3 (three) times daily as needed for muscle spasms.     dextromethorphan-guaiFENesin 30-600 MG per 12 hr tablet  Commonly known as:  MUCINEX DM  Take 1 tablet by mouth 2 (two) times daily.     diclofenac 75 MG EC tablet  Commonly known as:  VOLTAREN  Take 75 mg by mouth 2 (two) times daily.     donepezil 10 MG tablet  Commonly known as:  ARICEPT  Take 10 mg by mouth at bedtime.     finasteride 5 MG tablet  Commonly known as:  PROSCAR  Take 5 mg by mouth daily.     furosemide 20 MG  tablet  Commonly known as:  LASIX  Take 20 mg by mouth every morning.     furosemide 20 MG tablet  Commonly known as:  LASIX  TAKE 1 TABLET EVERY DAY AFTER BREAKFAST     loratadine 10 MG tablet  Commonly known as:  CLARITIN  Take 10 mg by mouth daily.     losartan 50 MG tablet  Commonly known as:  COZAAR  Take 50 mg by mouth 2 (two) times daily.     multivitamin with minerals Tabs tablet  Take 1 tablet by mouth daily.     nitroGLYCERIN 0.4 MG SL tablet  Commonly known as:  NITROSTAT  Place 0.4 mg under the tongue every 5 (five) minutes as needed. For chest pain     NYQUIL COLD & FLU PO  Take 30 mLs by mouth at bedtime as needed (for cold and flu).     omeprazole 40 MG capsule  Commonly known as:  PRILOSEC  Take 40 mg by mouth daily.     oxyCODONE-acetaminophen 5-325 MG per tablet  Commonly known as:  PERCOCET/ROXICET  Take 1-2 tablets by mouth every 4 (four) hours as needed for moderate pain.     potassium chloride SA 20 MEQ tablet  Commonly known as:  K-DUR,KLOR-CON  Take 1 tablet (20 mEq total) by mouth daily.     rosuvastatin 20 MG tablet  Commonly known as:  CRESTOR  Take 2 tablets (40 mg total) by mouth daily.     sertraline 50 MG tablet  Commonly known as:  ZOLOFT  Take 50 mg by mouth every morning.           Follow-up Information    Follow up with [facility].   Why:  home health physical therapy   Contact information:   [address] [place] [address] [phone]       Follow up with [facility].   Why:  rolling walker   Contact information:   [address] [place] [address] [phone]       Follow up with GIOFFRE,RONALD A, MD. Schedule an appointment as soon as possible for a visit in 2 weeks.  Specialty:  Orthopedic Surgery   Contact information:   [address] [place] [address] [phone]       Signed: Ardeen Jourdain, PA-C Orthopaedic  Surgery 02/27/2014, 12:44 PM

## 2014-03-01 ENCOUNTER — Other Ambulatory Visit: Payer: Self-pay | Admitting: Cardiology

## 2014-03-06 DIAGNOSIS — E781 Pure hyperglyceridemia: Secondary | ICD-10-CM | POA: Diagnosis not present

## 2014-03-06 DIAGNOSIS — I10 Essential (primary) hypertension: Secondary | ICD-10-CM | POA: Diagnosis not present

## 2014-03-06 DIAGNOSIS — M150 Primary generalized (osteo)arthritis: Secondary | ICD-10-CM | POA: Diagnosis not present

## 2014-03-06 DIAGNOSIS — Z96652 Presence of left artificial knee joint: Secondary | ICD-10-CM | POA: Diagnosis not present

## 2014-03-06 DIAGNOSIS — Z471 Aftercare following joint replacement surgery: Secondary | ICD-10-CM | POA: Diagnosis not present

## 2014-03-06 DIAGNOSIS — I2510 Atherosclerotic heart disease of native coronary artery without angina pectoris: Secondary | ICD-10-CM | POA: Diagnosis not present

## 2014-03-06 DIAGNOSIS — F0390 Unspecified dementia without behavioral disturbance: Secondary | ICD-10-CM | POA: Diagnosis not present

## 2014-03-07 DIAGNOSIS — Z96652 Presence of left artificial knee joint: Secondary | ICD-10-CM | POA: Diagnosis not present

## 2014-03-07 DIAGNOSIS — Z471 Aftercare following joint replacement surgery: Secondary | ICD-10-CM | POA: Diagnosis not present

## 2014-03-08 DIAGNOSIS — I2510 Atherosclerotic heart disease of native coronary artery without angina pectoris: Secondary | ICD-10-CM | POA: Diagnosis not present

## 2014-03-08 DIAGNOSIS — I10 Essential (primary) hypertension: Secondary | ICD-10-CM | POA: Diagnosis not present

## 2014-03-08 DIAGNOSIS — F0390 Unspecified dementia without behavioral disturbance: Secondary | ICD-10-CM | POA: Diagnosis not present

## 2014-03-08 DIAGNOSIS — E781 Pure hyperglyceridemia: Secondary | ICD-10-CM | POA: Diagnosis not present

## 2014-03-08 DIAGNOSIS — M150 Primary generalized (osteo)arthritis: Secondary | ICD-10-CM | POA: Diagnosis not present

## 2014-03-08 DIAGNOSIS — Z471 Aftercare following joint replacement surgery: Secondary | ICD-10-CM | POA: Diagnosis not present

## 2014-03-08 DIAGNOSIS — Z96652 Presence of left artificial knee joint: Secondary | ICD-10-CM | POA: Diagnosis not present

## 2014-03-10 ENCOUNTER — Other Ambulatory Visit: Payer: Self-pay

## 2014-03-10 DIAGNOSIS — Z96652 Presence of left artificial knee joint: Secondary | ICD-10-CM | POA: Diagnosis not present

## 2014-03-10 DIAGNOSIS — E781 Pure hyperglyceridemia: Secondary | ICD-10-CM | POA: Diagnosis not present

## 2014-03-10 DIAGNOSIS — I10 Essential (primary) hypertension: Secondary | ICD-10-CM | POA: Diagnosis not present

## 2014-03-10 DIAGNOSIS — Z471 Aftercare following joint replacement surgery: Secondary | ICD-10-CM | POA: Diagnosis not present

## 2014-03-10 DIAGNOSIS — F0390 Unspecified dementia without behavioral disturbance: Secondary | ICD-10-CM | POA: Diagnosis not present

## 2014-03-10 DIAGNOSIS — I2510 Atherosclerotic heart disease of native coronary artery without angina pectoris: Secondary | ICD-10-CM | POA: Diagnosis not present

## 2014-03-10 DIAGNOSIS — M150 Primary generalized (osteo)arthritis: Secondary | ICD-10-CM | POA: Diagnosis not present

## 2014-03-10 MED ORDER — OMEPRAZOLE 40 MG PO CPDR
40.0000 mg | DELAYED_RELEASE_CAPSULE | Freq: Every day | ORAL | Status: DC
Start: 2014-03-10 — End: 2014-04-24

## 2014-03-10 MED ORDER — OMEPRAZOLE 40 MG PO CPDR: 40.0000 mg | DELAYED_RELEASE_CAPSULE | Freq: Every day | ORAL | Status: DC

## 2014-03-20 ENCOUNTER — Ambulatory Visit: Payer: Commercial Managed Care - HMO | Attending: Orthopedic Surgery | Admitting: Physical Therapy

## 2014-03-20 DIAGNOSIS — I10 Essential (primary) hypertension: Secondary | ICD-10-CM | POA: Insufficient documentation

## 2014-03-20 DIAGNOSIS — E785 Hyperlipidemia, unspecified: Secondary | ICD-10-CM | POA: Diagnosis not present

## 2014-03-20 DIAGNOSIS — I2510 Atherosclerotic heart disease of native coronary artery without angina pectoris: Secondary | ICD-10-CM | POA: Diagnosis not present

## 2014-03-20 DIAGNOSIS — M25562 Pain in left knee: Secondary | ICD-10-CM | POA: Diagnosis not present

## 2014-03-20 DIAGNOSIS — Z96652 Presence of left artificial knee joint: Secondary | ICD-10-CM | POA: Insufficient documentation

## 2014-03-20 DIAGNOSIS — K219 Gastro-esophageal reflux disease without esophagitis: Secondary | ICD-10-CM | POA: Diagnosis not present

## 2014-03-20 DIAGNOSIS — Z471 Aftercare following joint replacement surgery: Secondary | ICD-10-CM | POA: Insufficient documentation

## 2014-03-20 DIAGNOSIS — M25669 Stiffness of unspecified knee, not elsewhere classified: Secondary | ICD-10-CM | POA: Insufficient documentation

## 2014-03-22 ENCOUNTER — Ambulatory Visit: Payer: Commercial Managed Care - HMO | Admitting: Physical Therapy

## 2014-03-22 DIAGNOSIS — R05 Cough: Secondary | ICD-10-CM | POA: Diagnosis not present

## 2014-03-22 DIAGNOSIS — J9801 Acute bronchospasm: Secondary | ICD-10-CM | POA: Diagnosis not present

## 2014-03-22 DIAGNOSIS — I2510 Atherosclerotic heart disease of native coronary artery without angina pectoris: Secondary | ICD-10-CM | POA: Diagnosis not present

## 2014-03-22 DIAGNOSIS — M25669 Stiffness of unspecified knee, not elsewhere classified: Secondary | ICD-10-CM | POA: Diagnosis not present

## 2014-03-22 DIAGNOSIS — B349 Viral infection, unspecified: Secondary | ICD-10-CM | POA: Diagnosis not present

## 2014-03-22 DIAGNOSIS — Z471 Aftercare following joint replacement surgery: Secondary | ICD-10-CM | POA: Diagnosis not present

## 2014-03-22 DIAGNOSIS — I10 Essential (primary) hypertension: Secondary | ICD-10-CM | POA: Diagnosis not present

## 2014-03-22 DIAGNOSIS — M25562 Pain in left knee: Secondary | ICD-10-CM | POA: Diagnosis not present

## 2014-03-22 DIAGNOSIS — E785 Hyperlipidemia, unspecified: Secondary | ICD-10-CM | POA: Diagnosis not present

## 2014-03-22 DIAGNOSIS — Z96652 Presence of left artificial knee joint: Secondary | ICD-10-CM | POA: Diagnosis not present

## 2014-03-22 DIAGNOSIS — K219 Gastro-esophageal reflux disease without esophagitis: Secondary | ICD-10-CM | POA: Diagnosis not present

## 2014-03-28 ENCOUNTER — Encounter: Payer: Commercial Managed Care - HMO | Admitting: *Deleted

## 2014-03-29 ENCOUNTER — Ambulatory Visit: Payer: Commercial Managed Care - HMO | Admitting: *Deleted

## 2014-03-29 DIAGNOSIS — I2510 Atherosclerotic heart disease of native coronary artery without angina pectoris: Secondary | ICD-10-CM | POA: Diagnosis not present

## 2014-03-29 DIAGNOSIS — Z471 Aftercare following joint replacement surgery: Secondary | ICD-10-CM | POA: Diagnosis not present

## 2014-03-29 DIAGNOSIS — Z96652 Presence of left artificial knee joint: Secondary | ICD-10-CM | POA: Diagnosis not present

## 2014-03-29 DIAGNOSIS — E785 Hyperlipidemia, unspecified: Secondary | ICD-10-CM | POA: Diagnosis not present

## 2014-03-29 DIAGNOSIS — I10 Essential (primary) hypertension: Secondary | ICD-10-CM | POA: Diagnosis not present

## 2014-03-29 DIAGNOSIS — K219 Gastro-esophageal reflux disease without esophagitis: Secondary | ICD-10-CM | POA: Diagnosis not present

## 2014-03-29 DIAGNOSIS — M25662 Stiffness of left knee, not elsewhere classified: Secondary | ICD-10-CM

## 2014-03-29 DIAGNOSIS — M25562 Pain in left knee: Secondary | ICD-10-CM

## 2014-03-29 DIAGNOSIS — M25669 Stiffness of unspecified knee, not elsewhere classified: Secondary | ICD-10-CM | POA: Diagnosis not present

## 2014-03-29 NOTE — Therapy (Signed)
[facility] [place], [place], [address] Phone: [phone]   Fax:  [phone]  Physical Therapy Treatment  Patient Details  Name: [name] MRN: [id number] Date of Birth: 1933/03/11 Referring Provider:  [name], MD  Encounter Date: 03/29/2014      PT End of Session - 03/29/14 1047    Visit Number 3   Number of Visits 12   Date for PT Re-Evaluation 04/17/14   PT Start Time 0946   PT Stop Time 9563   PT Time Calculation (min) 59 min      Past Medical History  Diagnosis Date  . Hypertension   . Heart murmur   . BPH (benign prostatic hypertrophy)   . GERD (gastroesophageal reflux disease)   . Anxiety   . Headache(784.0)     relieved with OTC's  . Dementia     early per pt- recently evaluated at [facility]- eccho7/13  , MRI head EPIC 6/13  . Bruises easily   . Unsteady gait   . Hyperlipidemia   . HBP (high blood pressure)   . Allergic rhinitis   . AI (aortic insufficiency)     Mild to Mod, Moderate AS Echo 02/2011  . Chronic fatigue   . Left carotid bruit 03/2008    w minimal obstruction on doppler  . OAB (overactive bladder)   . Dementia, vascular     Dr [name]  . Coronary artery disease     s/p PCI of left cir and LAD 2005 and PCI of prox PDA 7/09  . Stroke     possible   . Sleep apnea     STOP BANG SCORE 5 pt states was recently tested stated tested negative  . Shortness of breath dyspnea     with ADLs  . Arthritis     Past Surgical History  Procedure Laterality Date  . Transurethral resection of prostate    . Turp vaporization    . Cardiac catheterization      3 stents first cath 2 stents placed then additional stent placed  . Hemorroidectomy    . Rotator cuff repair  3/13    right  . Tonsillectomy    . Knee arthroscopy  09/19/2011    Procedure: ARTHROSCOPY KNEE;  Surgeon: [name], MD;  Location: [facility];  Service: Orthopedics;  Laterality: Left;  . Colonscopy     . Eye  surgery      cataract surgery bilat   . Total knee arthroplasty Left 02/21/2014    Procedure: LEFT TOTAL KNEE ARTHROPLASTY;  Surgeon: [name], MD;  Location: [facility];  Service: Orthopedics;  Laterality: Left;    There were no vitals taken for this visit.  Visit Diagnosis:  No diagnosis found.      Subjective Assessment - 03/29/14 1020    Symptoms LT knee is tight ,but doing well   Currently in Pain? Yes   Pain Score 2    Pain Location Knee   Pain Orientation Left   Pain Descriptors / Indicators Dull   Pain Type Acute pain   Aggravating Factors  night time   Pain Relieving Factors ice          [facility] PT Assessment - 03/29/14 0001    Precautions   Precaution Comments no ultrasound                  [facility] Adult PT Treatment/Exercise - 03/29/14 0001    Exercises   Exercises  Knee/Hip   Knee/Hip Exercises: Aerobic   Stationary Bike seat 11 x5 mins and nustep x 15 mins    Knee/Hip Exercises: Standing   Forward Step Up Left;3 sets;10 reps   Rocker Board 3 minutes   Rocker Board Limitations --  calf stretching   Modalities   Modalities Cryotherapy;Electrical Stimulation   Cryotherapy   Number Minutes Cryotherapy 15 Minutes   Cryotherapy Location Knee  LT   Type of Cryotherapy --  Vasopnuematic   Acupuncturist Stimulation Location premod x15 min  premod x 15 mins   Electrical Stimulation Goals Pain;Edema   Manual Therapy   Manual Therapy Passive ROM   Passive ROM --  prom to LT knee for knee ext x 9 min                     PT Long Term Goals - 03/29/14 1052    PT LONG TERM GOAL #1   Title demonstrate or verbalize techniques to reduce the risk of re-injury to include info on: anti-inflammatory (rice method)   Status --  03-22-14   PT LONG TERM GOAL #2   Title Independent with advanced HEP   PT LONG TERM GOAL #3   Title Increase active LT knee extension to 120-125 degrees   PT LONG TERM GOAL #4   Title perform  a reciprocating stair gait with one railing with pain not >2-3/10   PT LONG TERM GOAL #5   Title Decrease edema to within 2 cms of contralateral side to assist with ROM gains and decrease pain   Additional Long Term Goals   Additional Long Term Goals Yes   PT LONG TERM GOAL #6   Title Perform ADL's with pain not >2-3/10               Plan - 03/29/14 1050    Clinical Impression Statement Pt did well with exercises and stretching today   PT Treatment/Interventions Moist Heat;Cryotherapy;Electrical Stimulation;Functional mobility training;Manual techniques;Patient/family education;Therapeutic exercise;Gait training;Neuromuscular re-education  vaso   PT Next Visit Plan continue with TKA protocol        Problem List Patient Active Problem List   Diagnosis Date Noted  . H/O total knee replacement 02/21/2014  . SOB (shortness of breath) 10/20/2013  . Hypersomnia 10/20/2013  . Edema 04/13/2013  . Coronary artery disease   . Coronary atherosclerosis of native coronary artery 04/12/2013  . Pure hypercholesterolemia 04/12/2013  . Aortic stenosis 04/12/2013  . Essential hypertension, benign 04/12/2013  . PNA (pneumonia) 03/21/2013  . Influenza with respiratory manifestations 03/19/2013  . Osteoarthritis of left knee 09/19/2011  . Lateral meniscus tear, current 09/19/2011  . Medial meniscus, posterior horn derangement 09/19/2011    RAMSEUR,CHRIS, PTA 03/29/2014, 12:16 PM  [facility] [address] [place], [place], [address] Phone: [phone]   Fax:  [phone]

## 2014-04-04 ENCOUNTER — Encounter: Payer: Commercial Managed Care - HMO | Admitting: *Deleted

## 2014-04-10 ENCOUNTER — Ambulatory Visit: Payer: Commercial Managed Care - HMO | Admitting: *Deleted

## 2014-04-10 ENCOUNTER — Encounter: Payer: Self-pay | Admitting: *Deleted

## 2014-04-10 DIAGNOSIS — M25562 Pain in left knee: Secondary | ICD-10-CM | POA: Diagnosis not present

## 2014-04-10 DIAGNOSIS — Z96652 Presence of left artificial knee joint: Secondary | ICD-10-CM | POA: Diagnosis not present

## 2014-04-10 DIAGNOSIS — Z471 Aftercare following joint replacement surgery: Secondary | ICD-10-CM | POA: Diagnosis not present

## 2014-04-10 DIAGNOSIS — E785 Hyperlipidemia, unspecified: Secondary | ICD-10-CM | POA: Diagnosis not present

## 2014-04-10 DIAGNOSIS — M25662 Stiffness of left knee, not elsewhere classified: Secondary | ICD-10-CM

## 2014-04-10 DIAGNOSIS — K219 Gastro-esophageal reflux disease without esophagitis: Secondary | ICD-10-CM | POA: Diagnosis not present

## 2014-04-10 DIAGNOSIS — M25669 Stiffness of unspecified knee, not elsewhere classified: Secondary | ICD-10-CM | POA: Diagnosis not present

## 2014-04-10 DIAGNOSIS — I10 Essential (primary) hypertension: Secondary | ICD-10-CM | POA: Diagnosis not present

## 2014-04-10 DIAGNOSIS — I2510 Atherosclerotic heart disease of native coronary artery without angina pectoris: Secondary | ICD-10-CM | POA: Diagnosis not present

## 2014-04-10 NOTE — Therapy (Signed)
[facility] [place], [place], [address] Phone: [phone]   Fax:  [phone]  Physical Therapy Treatment  Patient Details  Name: [name] MRN: [id number] Date of Birth: 14-Apr-1933 Referring Provider:  [name], MD  Encounter Date: 04/10/2014      PT End of Session - 04/10/14 0918    Visit Number 4   Number of Visits 12   Date for PT Re-Evaluation 04/17/14   PT Start Time 0810   PT Stop Time 0907   PT Time Calculation (min) 57 min   Activity Tolerance Patient tolerated treatment well   Behavior During Therapy [facility] for tasks assessed/performed      Past Medical History  Diagnosis Date  . Hypertension   . Heart murmur   . BPH (benign prostatic hypertrophy)   . GERD (gastroesophageal reflux disease)   . Anxiety   . Headache(784.0)     relieved with OTC's  . Dementia     early per pt- recently evaluated at [facility]- eccho7/13  , MRI head EPIC 6/13  . Bruises easily   . Unsteady gait   . Hyperlipidemia   . HBP (high blood pressure)   . Allergic rhinitis   . AI (aortic insufficiency)     Mild to Mod, Moderate AS Echo 02/2011  . Chronic fatigue   . Left carotid bruit 03/2008    w minimal obstruction on doppler  . OAB (overactive bladder)   . Dementia, vascular     Dr [name]  . Coronary artery disease     s/p PCI of left cir and LAD 2005 and PCI of prox PDA 7/09  . Stroke     possible   . Sleep apnea     STOP BANG SCORE 5 pt states was recently tested stated tested negative  . Shortness of breath dyspnea     with ADLs  . Arthritis     Past Surgical History  Procedure Laterality Date  . Transurethral resection of prostate    . Turp vaporization    . Cardiac catheterization      3 stents first cath 2 stents placed then additional stent placed  . Hemorroidectomy    . Rotator cuff repair  3/13    right  . Tonsillectomy    . Knee arthroscopy  09/19/2011    Procedure: ARTHROSCOPY KNEE;   Surgeon: [name], MD;  Location: [facility];  Service: Orthopedics;  Laterality: Left;  . Colonscopy     . Eye surgery      cataract surgery bilat   . Total knee arthroplasty Left 02/21/2014    Procedure: LEFT TOTAL KNEE ARTHROPLASTY;  Surgeon: [name], MD;  Location: [facility];  Service: Orthopedics;  Laterality: Left;    There were no vitals taken for this visit.  Visit Diagnosis:  No diagnosis found.      Subjective Assessment - 04/10/14 0859    Symptoms LT knee sore but "able to do what I need to do." Use the cane but can walk without it. Hurts more early in morning or if weather is cool and damp.   Limitations Walking   Currently in Pain? Yes   Pain Score 2    Pain Location Knee   Pain Orientation Left   Pain Descriptors / Indicators Aching;Tightness   Pain Type Surgical pain                    [facility] Adult PT Treatment/Exercise -  04/10/14 0001    Knee/Hip Exercises: Aerobic   Stationary Bike Nustep X 17, workload 5. Bike L2 X10 min   Cryotherapy   Type of Cryotherapy Other (comment)  vasopneumatic   Electrical Stimulation   Electrical Stimulation Location LT knee   Manual Therapy   Manual Therapy Passive ROM  low load long duration holds into extension..patella mobs                      PT Long Term Goals - 03/29/14 1052    PT LONG TERM GOAL #1   Title demonstrate or verbalize techniques to reduce the risk of re-injury to include info on: anti-inflammatory (rice method)   Status --  03-22-14   PT LONG TERM GOAL #2   Title Independent with advanced HEP   PT LONG TERM GOAL #3   Title Increase active LT knee extension to 120-125 degrees   PT LONG TERM GOAL #4   Title perform a reciprocating stair gait with one railing with pain not >2-3/10   PT LONG TERM GOAL #5   Title Decrease edema to within 2 cms of contralateral side to assist with ROM gains and decrease pain   Additional Long Term Goals   Additional Long Term Goals Yes    PT LONG TERM GOAL #6   Title Perform ADL's with pain not >2-3/10               Plan - 04/10/14 0920    Clinical Impression Statement Pt feels he is getting around well using cane. Has 2 telfa pads on anterior lower leg where patient describes slight oozing.   Pt will benefit from skilled therapeutic intervention in order to improve on the following deficits Abnormal gait;Decreased range of motion;Increased edema   Rehab Potential Good   PT Frequency 3x / week   PT Duration 8 weeks        Problem List Patient Active Problem List   Diagnosis Date Noted  . H/O total knee replacement 02/21/2014  . SOB (shortness of breath) 10/20/2013  . Hypersomnia 10/20/2013  . Edema 04/13/2013  . Coronary artery disease   . Coronary atherosclerosis of native coronary artery 04/12/2013  . Pure hypercholesterolemia 04/12/2013  . Aortic stenosis 04/12/2013  . Essential hypertension, benign 04/12/2013  . PNA (pneumonia) 03/21/2013  . Influenza with respiratory manifestations 03/19/2013  . Osteoarthritis of left knee 09/19/2011  . Lateral meniscus tear, current 09/19/2011  . Medial meniscus, posterior horn derangement 09/19/2011    Fabienne Bruns P,PTA 04/10/2014, 9:28 AM  [facility] [address] [place], [place], [address] Phone: [phone]   Fax:  [phone]

## 2014-04-16 NOTE — Progress Notes (Signed)
Cardiology Office Note   Date:  04/17/2014   ID:  [name], DOB 01/08/1934, MRN [id number]  PCP:  [name], MD  Cardiologist:   Sueanne Margarita, MD   Chief Complaint  Patient presents with  . Coronary Artery Disease  . Hypertension  . Hyperlipidemia      History of Present Illness: [name] is a 80 y.o. male with a history of ASCAD s/p remote PCI of left circ/LAD and prox PDA, HTN, dyslipidemia, mild to mod AI and moderate AS who presents today for followup. He is doing well except for severe fatigue. He denies any chest pain or pressure, dizziness, palpitations or syncope. He has chronic DOE and recently felt is was worse so he underwent nuclear stress test that showed no ischemia.  He has chronic LE edema which is stable.  At last OV his wife complained that he snores. He also complained of feeling  fatigued in the am when he gets up and had problems falling asleep during the day.  He had a sleep study which did not show any sleep apnea.  His wife is concerned that he continues to be SOB with any exertion and has exertional fatigue.      Past Medical History  Diagnosis Date  . Hypertension   . Heart murmur   . BPH (benign prostatic hypertrophy)   . GERD (gastroesophageal reflux disease)   . Anxiety   . Headache(784.0)     relieved with OTC's  . Dementia     early per pt- recently evaluated at [facility]- eccho7/13  , MRI head EPIC 6/13  . Bruises easily   . Unsteady gait   . Hyperlipidemia   . HBP (high blood pressure)   . Allergic rhinitis   . AI (aortic insufficiency)     Mild to Mod, Moderate AS Echo 02/2011  . Chronic fatigue   . Left carotid bruit 03/2008    w minimal obstruction on doppler  . OAB (overactive bladder)   . Dementia, vascular     Dr [name]  . Coronary artery disease     s/p PCI of left cir and LAD 2005 and PCI of prox PDA 7/09  . Stroke     possible   . Sleep apnea     STOP BANG SCORE 5 pt states was recently tested  stated tested negative  . Shortness of breath dyspnea     with ADLs  . Arthritis     Past Surgical History  Procedure Laterality Date  . Transurethral resection of prostate    . Turp vaporization    . Cardiac catheterization      3 stents first cath 2 stents placed then additional stent placed  . Hemorroidectomy    . Rotator cuff repair  3/13    right  . Tonsillectomy    . Knee arthroscopy  09/19/2011    Procedure: ARTHROSCOPY KNEE;  Surgeon: [name], MD;  Location: [facility];  Service: Orthopedics;  Laterality: Left;  . Colonscopy     . Eye surgery      cataract surgery bilat   . Total knee arthroplasty Left 02/21/2014    Procedure: LEFT TOTAL KNEE ARTHROPLASTY;  Surgeon: [name], MD;  Location: [facility];  Service: Orthopedics;  Laterality: Left;     Current Outpatient Prescriptions  Medication Sig Dispense Refill  . amLODipine (NORVASC) 5 MG tablet Take 5 mg by mouth every morning.    [name] Kitchen aspirin 81 MG  tablet Take 81 mg by mouth daily.    . carvedilol (COREG) 12.5 MG tablet Take 12.5 mg by mouth 2 (two) times daily with a meal.    . clopidogrel (PLAVIX) 75 MG tablet TAKE 1 TABLET EVERY DAY 90 tablet 0  . diclofenac (VOLTAREN) 75 MG EC tablet Take 75 mg by mouth 2 (two) times daily.     [name] Kitchen DM-Doxylamine-Acetaminophen (NYQUIL COLD & FLU PO) Take 30 mLs by mouth at bedtime as needed (for cold and flu).    [name] Kitchen donepezil (ARICEPT) 10 MG tablet Take 10 mg by mouth at bedtime.    . finasteride (PROSCAR) 5 MG tablet Take 5 mg by mouth daily.    . furosemide (LASIX) 20 MG tablet Take 20 mg by mouth every morning.    [name] Kitchen HYDROcodone-acetaminophen (NORCO/VICODIN) 5-325 MG per tablet Take 1 tablet by mouth as needed. For pain  0  . losartan (COZAAR) 50 MG tablet Take 1 tablet (50 mg total) by mouth 2 (two) times daily. 180 tablet 0  . Multiple Vitamin (MULTIVITAMIN WITH MINERALS) TABS Take 1 tablet by mouth daily.    . nitroGLYCERIN (NITROSTAT) 0.4 MG SL tablet Place 0.4 mg under the  tongue every 5 (five) minutes as needed. For chest pain    . omeprazole (PRILOSEC) 40 MG capsule Take 1 capsule (40 mg total) by mouth daily. 10 capsule 0  . oxyCODONE-acetaminophen (PERCOCET/ROXICET) 5-325 MG per tablet Take 1-2 tablets by mouth every 4 (four) hours as needed for moderate pain. 60 tablet 0  . potassium chloride SA (K-DUR,KLOR-CON) 20 MEQ tablet TAKE 1 TABLET EVERY DAY 90 tablet 0  . rosuvastatin (CRESTOR) 20 MG tablet Take 2 tablets (40 mg total) by mouth daily. (Patient taking differently: Take 10 mg by mouth daily. ) 90 tablet 3  . sertraline (ZOLOFT) 50 MG tablet Take 50 mg by mouth every morning.      No current facility-administered medications for this visit.    Allergies:   Latex and Sulfa antibiotics    Social History:  The patient  reports that he quit smoking about 44 years ago. His smoking use included Cigars. He has quit using smokeless tobacco. His smokeless tobacco use included Chew. He reports that he drinks about 1.8 oz of alcohol per week. He reports that he does not use illicit drugs.   Family History:  The patient's family history includes Lung cancer in his father. There is no history of Anesthesia problems.    ROS:  Please see the history of present illness.   Otherwise, review of systems are positive for none.   All other systems are reviewed and negative.    PHYSICAL EXAM: VS:  BP 144/72 mmHg  Pulse 60  Ht 6\' 2"  (1.88 m)  Wt 246 lb (111.585 kg)  BMI 31.57 kg/m2  SpO2 97% , BMI Body mass index is 31.57 kg/(m^2). GEN: Well nourished, well developed, in no acute distress HEENT: normal Neck: no JVD, carotid bruits, or masses Cardiac: RRR; no murmurs, rubs, or gallops,no edema  Respiratory:  clear to auscultation bilaterally, normal work of breathing GI: soft, nontender, nondistended, + BS MS: no deformity or atrophy Skin: warm and dry, no rash Neuro:  Strength and sensation are intact Psych: euthymic mood, full affect   EKG:  EKG is not  ordered today.    Recent Labs: 12/01/2013: Pro B Natriuretic peptide (BNP) 79.0 02/14/2014: ALT 40 02/23/2014: BUN 13; Creatinine 0.74; Hemoglobin 12.6*; Platelets 159; Potassium 3.6; Sodium 129*  Lipid Panel    Component Value Date/Time   CHOL 148 01/13/2014 0909   TRIG 112.0 01/13/2014 0909   HDL 45.20 01/13/2014 0909   CHOLHDL 3 01/13/2014 0909   VLDL 22.4 01/13/2014 0909   LDLCALC 80 01/13/2014 0909      Wt Readings from Last 3 Encounters:  04/17/14 246 lb (111.585 kg)  02/21/14 251 lb 2 oz (113.91 kg)  11/03/13 248 lb (112.492 kg)       ASSESSMENT AND PLAN:        1.  ASCAD - no anginal CP and nuclear stress test showed no ischemia - continue ASA/Plavix  2. HTN - controlled - continue Cozaar/Coreg/amlodipine  3. Dyslipidemia - LDL not at goal on last lipid panel 6/15 which was 89 (goal<70) - Continue  Crestor - he only can tolerate 10mg  daily - check fasting lipids and ALT 4. Mild AI by echo 3/15 5. Moderate AS - stable - no change from prior echo - will repeat echo to make sure it has not worsened with increased SOB  6. Chronic LE edema - stable  - continue Lasix   7. Hypersomnia with severe fatigue - no significant OSA on sleep study       8.  Chronic SOB with normal stress test.  His AS is only moderate so I doubt that is causing his symptoms.  I have recommended that we get PFTs with DLCO and refer to pulmonary. He complains of increased cough productive of white phlegm. His wife says that if he eats sometimes he will get coughing and choking so ? Whether he may be aspirating.  He has a history of GERD and is on Prilosec.   I am going to repeat his echo to make sure his AS has not worsened.     Current medicines are reviewed at length with the patient today.  The patient does not have concerns regarding medicines.  The following changes have been made:  no change  Labs/ tests ordered today include: FLP and ALT, 2D echo    Disposition:   FU  with me in 6 months   Signed, Sueanne Margarita, MD  04/17/2014 11:10 AM    [facility] [address], [place], [place]  [address] Phone: [phone]; Fax: [phone]

## 2014-04-17 ENCOUNTER — Encounter: Payer: Commercial Managed Care - HMO | Admitting: Physical Therapy

## 2014-04-17 ENCOUNTER — Ambulatory Visit (INDEPENDENT_AMBULATORY_CARE_PROVIDER_SITE_OTHER): Payer: Commercial Managed Care - HMO | Admitting: Cardiology

## 2014-04-17 ENCOUNTER — Encounter: Payer: Self-pay | Admitting: Cardiology

## 2014-04-17 VITALS — BP 144/72 | HR 60 | Ht 74.0 in | Wt 246.0 lb

## 2014-04-17 DIAGNOSIS — I2583 Coronary atherosclerosis due to lipid rich plaque: Principal | ICD-10-CM

## 2014-04-17 DIAGNOSIS — R609 Edema, unspecified: Secondary | ICD-10-CM | POA: Diagnosis not present

## 2014-04-17 DIAGNOSIS — I10 Essential (primary) hypertension: Secondary | ICD-10-CM

## 2014-04-17 DIAGNOSIS — E7800 Pure hypercholesterolemia, unspecified: Secondary | ICD-10-CM

## 2014-04-17 DIAGNOSIS — I350 Nonrheumatic aortic (valve) stenosis: Secondary | ICD-10-CM

## 2014-04-17 DIAGNOSIS — I2510 Atherosclerotic heart disease of native coronary artery without angina pectoris: Secondary | ICD-10-CM

## 2014-04-17 DIAGNOSIS — R0602 Shortness of breath: Secondary | ICD-10-CM | POA: Diagnosis not present

## 2014-04-17 NOTE — Patient Instructions (Addendum)
Your physician recommends that you return for FASTING lab work on the same day as your ECHO.   Your physician has requested that you have an echocardiogram. Echocardiography is a painless test that uses sound waves to create images of your heart. It provides your doctor with information about the size and shape of your heart and how well your heart's chambers and valves are working. This procedure takes approximately one hour. There are no restrictions for this procedure.  Your physician has recommended that you have a pulmonary function test. Pulmonary Function Tests are a group of tests that measure how well air moves in and out of your lungs.  You have been referred to Pulmonary for evaluation.   Your physician wants you to follow-up in: 6 months with Dr. Radford Pax. You will receive a reminder letter in the mail two months in advance. If you don't receive a letter, please call our office to schedule the follow-up appointment.

## 2014-04-18 ENCOUNTER — Encounter: Payer: Self-pay | Admitting: Physical Therapy

## 2014-04-18 ENCOUNTER — Ambulatory Visit: Payer: Commercial Managed Care - HMO | Attending: Orthopedic Surgery | Admitting: Physical Therapy

## 2014-04-18 DIAGNOSIS — K219 Gastro-esophageal reflux disease without esophagitis: Secondary | ICD-10-CM | POA: Diagnosis not present

## 2014-04-18 DIAGNOSIS — I2510 Atherosclerotic heart disease of native coronary artery without angina pectoris: Secondary | ICD-10-CM | POA: Diagnosis not present

## 2014-04-18 DIAGNOSIS — E785 Hyperlipidemia, unspecified: Secondary | ICD-10-CM | POA: Diagnosis not present

## 2014-04-18 DIAGNOSIS — M25562 Pain in left knee: Secondary | ICD-10-CM | POA: Diagnosis not present

## 2014-04-18 DIAGNOSIS — Z96652 Presence of left artificial knee joint: Secondary | ICD-10-CM | POA: Diagnosis not present

## 2014-04-18 DIAGNOSIS — I10 Essential (primary) hypertension: Secondary | ICD-10-CM | POA: Insufficient documentation

## 2014-04-18 DIAGNOSIS — M25669 Stiffness of unspecified knee, not elsewhere classified: Secondary | ICD-10-CM | POA: Insufficient documentation

## 2014-04-18 DIAGNOSIS — M25662 Stiffness of left knee, not elsewhere classified: Secondary | ICD-10-CM

## 2014-04-18 DIAGNOSIS — Z471 Aftercare following joint replacement surgery: Secondary | ICD-10-CM | POA: Diagnosis not present

## 2014-04-18 NOTE — Patient Instructions (Signed)
PROM: Knee Flexion   With towel around left heel, gently pull knee up with towel until stretch is felt. Hold __30__ seconds. Repeat _5___ times per set. Do __2__ sets per session. Do _3___ sessions per day.  http://orth.exer.us/672   Copyright  VHI. All rights reserved.

## 2014-04-18 NOTE — Therapy (Signed)
[facility] [place], [place], [address] Phone: [phone]   Fax:  [phone]  Physical Therapy Treatment  Patient Details  Name: [name] MRN: [id number] Date of Birth: Jun 16, 1933 Referring Provider:  [name], MD  Encounter Date: 04/18/2014      PT End of Session - 04/18/14 0939    Visit Number 5   Number of Visits 12   Date for PT Re-Evaluation 04/17/14   PT Start Time 0959   PT Stop Time 0957   PT Time Calculation (min) 1438 min   Activity Tolerance Patient tolerated treatment well   Behavior During Therapy [facility] for tasks assessed/performed      Past Medical History  Diagnosis Date  . Hypertension   . Heart murmur   . BPH (benign prostatic hypertrophy)   . GERD (gastroesophageal reflux disease)   . Anxiety   . Headache(784.0)     relieved with OTC's  . Dementia     early per pt- recently evaluated at [facility]- eccho7/13  , MRI head EPIC 6/13  . Bruises easily   . Unsteady gait   . Hyperlipidemia   . HBP (high blood pressure)   . Allergic rhinitis   . AI (aortic insufficiency)     Mild to Mod, Moderate AS Echo 02/2011  . Chronic fatigue   . Left carotid bruit 03/2008    w minimal obstruction on doppler  . OAB (overactive bladder)   . Dementia, vascular     Dr [name]  . Coronary artery disease     s/p PCI of left cir and LAD 2005 and PCI of prox PDA 7/09  . Stroke     possible   . Sleep apnea     STOP BANG SCORE 5 pt states was recently tested stated tested negative  . Shortness of breath dyspnea     with ADLs  . Arthritis     Past Surgical History  Procedure Laterality Date  . Transurethral resection of prostate    . Turp vaporization    . Cardiac catheterization      3 stents first cath 2 stents placed then additional stent placed  . Hemorroidectomy    . Rotator cuff repair  3/13    right  . Tonsillectomy    . Knee arthroscopy  09/19/2011    Procedure: ARTHROSCOPY KNEE;   Surgeon: [name], MD;  Location: [facility];  Service: Orthopedics;  Laterality: Left;  . Colonscopy     . Eye surgery      cataract surgery bilat   . Total knee arthroplasty Left 02/21/2014    Procedure: LEFT TOTAL KNEE ARTHROPLASTY;  Surgeon: [name], MD;  Location: [facility];  Service: Orthopedics;  Laterality: Left;    There were no vitals taken for this visit.  Visit Diagnosis:  Left knee pain  Knee stiffness, left      Subjective Assessment - 04/18/14 0906    Symptoms went to MD on saturday, and MD said knee is doing good, has had no pain   Currently in Pain? No/denies          [facility] PT Assessment - 04/18/14 0001    ROM / Strength   AROM / PROM / Strength AROM;PROM   AROM   Overall AROM  Deficits   AROM Assessment Site Knee   Right/Left Knee Left   Left Knee Flexion 110   PROM   Overall PROM  Deficits   PROM Assessment  Site Knee   Right/Left Knee Left   Left Knee Flexion 120                  [facility] Adult PT Treatment/Exercise - 04/18/14 0001    Exercises   Exercises Knee/Hip   Knee/Hip Exercises: Aerobic   Stationary Bike Bike x 15 min   Cryotherapy   Number Minutes Cryotherapy 15 Minutes   Cryotherapy Location Knee   Type of Cryotherapy --  vasopnumatic device   Manual Therapy   Manual Therapy Passive ROM   Passive ROM low load holds for flexion   Knee/Hip Exercises: Machines for Strengthening   Cybex Knee Extension 10# 3x10   Cybex Knee Flexion 30# 3x10                PT Education - 04/18/14 0932    Education provided Yes   Education Details HEP ROM   Person(s) Educated Patient   Methods Explanation;Demonstration;Handout   Comprehension Verbalized understanding;Returned demonstration             PT Long Term Goals - 04/18/14 0941    PT LONG TERM GOAL #1   Title demonstrate or verbalize techniques to reduce the risk of re-injury to include info on: anti-inflammatory (rice method)   Status Achieved  03/22/14    PT LONG TERM GOAL #2   Title Independent with advanced HEP   Time 4   Status Achieved   PT LONG TERM GOAL #3   Time 4   Period Weeks   Status On-going   PT LONG TERM GOAL #4   Title perform a reciprocating stair gait with one railing with pain not >2-3/10   Time 4   Period Weeks   Status Achieved   PT LONG TERM GOAL #5   Title Decrease edema to within 2 cms of contralateral side to assist with ROM gains and decrease pain   Time 4   Period Weeks   Status Achieved   PT LONG TERM GOAL #6   Title Perform ADL's with pain not >2-3/10   Time 4   Period Weeks   Status Achieved               Plan - 04/18/14 0940    Clinical Impression Statement pt tolerated tx well, has no pain reports, 2cm edema and is independent with all ADL.Met LTG #2,# 4, #5, #6 today others ongoing.FOTO 44% limitation today   PT Treatment/Interventions Moist Heat;Cryotherapy;Electrical Stimulation;Functional mobility training;Manual techniques;Patient/family education;Therapeutic exercise;Gait training;Neuromuscular re-education   PT Next Visit Plan cont per MPT/MD   Consulted and Agree with Plan of Care Patient        Problem List Patient Active Problem List   Diagnosis Date Noted  . H/O total knee replacement 02/21/2014  . SOB (shortness of breath) 10/20/2013  . Hypersomnia 10/20/2013  . Edema 04/13/2013  . Coronary atherosclerosis of native coronary artery 04/12/2013  . Pure hypercholesterolemia 04/12/2013  . Aortic stenosis 04/12/2013  . Essential hypertension, benign 04/12/2013  . PNA (pneumonia) 03/21/2013  . Influenza with respiratory manifestations 03/19/2013  . Osteoarthritis of left knee 09/19/2011  . Lateral meniscus tear, current 09/19/2011  . Medial meniscus, posterior horn derangement 09/19/2011   Ladean Raya, PTA 04/18/2014 9:47 AM Liyat Faulkenberry, Venetia Maxon 04/18/2014, 9:47 AM  [facility] [address] [place], [place],  [address] Phone: [phone]   Fax:  [phone]

## 2014-04-18 NOTE — Therapy (Signed)
[facility] [place], [place], [address] Phone: [phone]   Fax:  [phone]  Physical Therapy Treatment  Patient Details  Name: [name] MRN: [id number] Date of Birth: Jun 16, 1933 Referring Provider:  [name], MD  Encounter Date: 04/18/2014      PT End of Session - 04/18/14 0939    Visit Number 5   Number of Visits 12   Date for PT Re-Evaluation 04/17/14   PT Start Time 0959   PT Stop Time 0957   PT Time Calculation (min) 1438 min   Activity Tolerance Patient tolerated treatment well   Behavior During Therapy [facility] for tasks assessed/performed      Past Medical History  Diagnosis Date  . Hypertension   . Heart murmur   . BPH (benign prostatic hypertrophy)   . GERD (gastroesophageal reflux disease)   . Anxiety   . Headache(784.0)     relieved with OTC's  . Dementia     early per pt- recently evaluated at [facility]- eccho7/13  , MRI head EPIC 6/13  . Bruises easily   . Unsteady gait   . Hyperlipidemia   . HBP (high blood pressure)   . Allergic rhinitis   . AI (aortic insufficiency)     Mild to Mod, Moderate AS Echo 02/2011  . Chronic fatigue   . Left carotid bruit 03/2008    w minimal obstruction on doppler  . OAB (overactive bladder)   . Dementia, vascular     Dr [name]  . Coronary artery disease     s/p PCI of left cir and LAD 2005 and PCI of prox PDA 7/09  . Stroke     possible   . Sleep apnea     STOP BANG SCORE 5 pt states was recently tested stated tested negative  . Shortness of breath dyspnea     with ADLs  . Arthritis     Past Surgical History  Procedure Laterality Date  . Transurethral resection of prostate    . Turp vaporization    . Cardiac catheterization      3 stents first cath 2 stents placed then additional stent placed  . Hemorroidectomy    . Rotator cuff repair  3/13    right  . Tonsillectomy    . Knee arthroscopy  09/19/2011    Procedure: ARTHROSCOPY KNEE;   Surgeon: [name], MD;  Location: [facility];  Service: Orthopedics;  Laterality: Left;  . Colonscopy     . Eye surgery      cataract surgery bilat   . Total knee arthroplasty Left 02/21/2014    Procedure: LEFT TOTAL KNEE ARTHROPLASTY;  Surgeon: [name], MD;  Location: [facility];  Service: Orthopedics;  Laterality: Left;    There were no vitals taken for this visit.  Visit Diagnosis:  Left knee pain  Knee stiffness, left      Subjective Assessment - 04/18/14 0906    Symptoms went to MD on saturday, and MD said knee is doing good, has had no pain   Currently in Pain? No/denies          [facility] PT Assessment - 04/18/14 0001    ROM / Strength   AROM / PROM / Strength AROM;PROM   AROM   Overall AROM  Deficits   AROM Assessment Site Knee   Right/Left Knee Left   Left Knee Flexion 110   PROM   Overall PROM  Deficits   PROM Assessment  Site Knee   Right/Left Knee Left   Left Knee Flexion 120                  [facility] Adult PT Treatment/Exercise - 04/18/14 0001    Exercises   Exercises Knee/Hip   Knee/Hip Exercises: Aerobic   Stationary Bike Bike x 15 min   Cryotherapy   Number Minutes Cryotherapy 15 Minutes   Cryotherapy Location Knee   Type of Cryotherapy --  vasopnumatic device   Manual Therapy   Manual Therapy Passive ROM   Passive ROM low load holds for flexion   Knee/Hip Exercises: Machines for Strengthening   Cybex Knee Extension 10# 3x10   Cybex Knee Flexion 30# 3x10                PT Education - 04/18/14 0932    Education provided Yes   Education Details HEP ROM   Person(s) Educated Patient   Methods Explanation;Demonstration;Handout   Comprehension Verbalized understanding;Returned demonstration             PT Long Term Goals - 04/18/14 0941    PT LONG TERM GOAL #1   Title demonstrate or verbalize techniques to reduce the risk of re-injury to include info on: anti-inflammatory (rice method)   Status Achieved  03/22/14    PT LONG TERM GOAL #2   Title Independent with advanced HEP   Time 4   Status Achieved   PT LONG TERM GOAL #3   Time 4   Period Weeks   Status On-going   PT LONG TERM GOAL #4   Title perform a reciprocating stair gait with one railing with pain not >2-3/10   Time 4   Period Weeks   Status Achieved   PT LONG TERM GOAL #5   Title Decrease edema to within 2 cms of contralateral side to assist with ROM gains and decrease pain   Time 4   Period Weeks   Status Achieved   PT LONG TERM GOAL #6   Title Perform ADL's with pain not >2-3/10   Time 4   Period Weeks   Status Achieved               Plan - 04/18/14 0940    Clinical Impression Statement pt tolerated tx well, has no pain reports, 2cm edema and is independent with all ADL.Met LTG #2,# 4, #5, #6 today others ongoing.FOTO 44% limitation today   PT Treatment/Interventions Moist Heat;Cryotherapy;Electrical Stimulation;Functional mobility training;Manual techniques;Patient/family education;Therapeutic exercise;Gait training;Neuromuscular re-education   PT Next Visit Plan cont per MPT/MD   Consulted and Agree with Plan of Care Patient        Problem List Patient Active Problem List   Diagnosis Date Noted  . H/O total knee replacement 02/21/2014  . SOB (shortness of breath) 10/20/2013  . Hypersomnia 10/20/2013  . Edema 04/13/2013  . Coronary atherosclerosis of native coronary artery 04/12/2013  . Pure hypercholesterolemia 04/12/2013  . Aortic stenosis 04/12/2013  . Essential hypertension, benign 04/12/2013  . PNA (pneumonia) 03/21/2013  . Influenza with respiratory manifestations 03/19/2013  . Osteoarthritis of left knee 09/19/2011  . Lateral meniscus tear, current 09/19/2011  . Medial meniscus, posterior horn derangement 09/19/2011    Torii Royse, [place] MPT 04/18/2014, 10:25 AM  [facility] [address] [place], [place], [address] Phone: [phone]   Fax:   [phone]

## 2014-04-20 ENCOUNTER — Other Ambulatory Visit (HOSPITAL_COMMUNITY): Payer: Self-pay | Admitting: Cardiology

## 2014-04-20 DIAGNOSIS — I350 Nonrheumatic aortic (valve) stenosis: Secondary | ICD-10-CM

## 2014-04-21 ENCOUNTER — Other Ambulatory Visit (INDEPENDENT_AMBULATORY_CARE_PROVIDER_SITE_OTHER): Payer: Commercial Managed Care - HMO | Admitting: *Deleted

## 2014-04-21 ENCOUNTER — Ambulatory Visit (HOSPITAL_COMMUNITY): Payer: Commercial Managed Care - HMO | Attending: Internal Medicine | Admitting: Radiology

## 2014-04-21 DIAGNOSIS — E669 Obesity, unspecified: Secondary | ICD-10-CM | POA: Diagnosis not present

## 2014-04-21 DIAGNOSIS — E7800 Pure hypercholesterolemia, unspecified: Secondary | ICD-10-CM

## 2014-04-21 DIAGNOSIS — E785 Hyperlipidemia, unspecified: Secondary | ICD-10-CM | POA: Diagnosis not present

## 2014-04-21 DIAGNOSIS — I359 Nonrheumatic aortic valve disorder, unspecified: Secondary | ICD-10-CM | POA: Diagnosis not present

## 2014-04-21 DIAGNOSIS — I10 Essential (primary) hypertension: Secondary | ICD-10-CM | POA: Insufficient documentation

## 2014-04-21 DIAGNOSIS — I350 Nonrheumatic aortic (valve) stenosis: Secondary | ICD-10-CM

## 2014-04-21 LAB — HEPATIC FUNCTION PANEL
ALBUMIN: 3.8 g/dL (ref 3.5–5.2)
ALT: 16 U/L (ref 0–53)
AST: 17 U/L (ref 0–37)
Alkaline Phosphatase: 141 U/L — ABNORMAL HIGH (ref 39–117)
Bilirubin, Direct: 0.1 mg/dL (ref 0.0–0.3)
Total Bilirubin: 0.6 mg/dL (ref 0.2–1.2)
Total Protein: 6.8 g/dL (ref 6.0–8.3)

## 2014-04-21 LAB — LIPID PANEL
Cholesterol: 128 mg/dL (ref 0–200)
HDL: 47.8 mg/dL (ref >39.00)
LDL CALC: 67 mg/dL (ref 0–99)
NONHDL: 80.2
Total CHOL/HDL Ratio: 3
Triglycerides: 68 mg/dL (ref 0.0–149.0)
VLDL: 13.6 mg/dL (ref 0.0–40.0)

## 2014-04-21 NOTE — Addendum Note (Signed)
Addended by: Eulis Foster on: 04/21/2014 09:14 AM   Modules accepted: Orders

## 2014-04-21 NOTE — Progress Notes (Signed)
Echocardiogram performed.  

## 2014-04-24 ENCOUNTER — Other Ambulatory Visit: Payer: Self-pay | Admitting: *Deleted

## 2014-04-24 ENCOUNTER — Telehealth: Payer: Self-pay | Admitting: *Deleted

## 2014-04-24 MED ORDER — CLOPIDOGREL BISULFATE 75 MG PO TABS: 75.0000 mg | ORAL_TABLET | Freq: Every day | ORAL | Status: DC

## 2014-04-24 MED ORDER — OMEPRAZOLE 40 MG PO CPDR: 40.0000 mg | DELAYED_RELEASE_CAPSULE | Freq: Every day | ORAL | Status: DC

## 2014-04-24 MED ORDER — LOSARTAN POTASSIUM 50 MG PO TABS: 50.0000 mg | ORAL_TABLET | Freq: Two times a day (BID) | ORAL | Status: DC

## 2014-04-24 NOTE — Telephone Encounter (Signed)
Will refill omeprazole for patient.

## 2014-04-24 NOTE — Telephone Encounter (Signed)
Ok to refill omeprazole for patient? Please advise. Thanks, MI

## 2014-04-24 NOTE — Telephone Encounter (Signed)
yes

## 2014-04-25 ENCOUNTER — Ambulatory Visit: Payer: Commercial Managed Care - HMO | Admitting: Physical Therapy

## 2014-04-25 ENCOUNTER — Encounter: Payer: Self-pay | Admitting: Physical Therapy

## 2014-04-25 DIAGNOSIS — Z96652 Presence of left artificial knee joint: Secondary | ICD-10-CM | POA: Diagnosis not present

## 2014-04-25 DIAGNOSIS — I2510 Atherosclerotic heart disease of native coronary artery without angina pectoris: Secondary | ICD-10-CM | POA: Diagnosis not present

## 2014-04-25 DIAGNOSIS — E785 Hyperlipidemia, unspecified: Secondary | ICD-10-CM | POA: Diagnosis not present

## 2014-04-25 DIAGNOSIS — M25669 Stiffness of unspecified knee, not elsewhere classified: Secondary | ICD-10-CM | POA: Diagnosis not present

## 2014-04-25 DIAGNOSIS — M25562 Pain in left knee: Secondary | ICD-10-CM | POA: Diagnosis not present

## 2014-04-25 DIAGNOSIS — I10 Essential (primary) hypertension: Secondary | ICD-10-CM | POA: Diagnosis not present

## 2014-04-25 DIAGNOSIS — M25662 Stiffness of left knee, not elsewhere classified: Secondary | ICD-10-CM

## 2014-04-25 DIAGNOSIS — K219 Gastro-esophageal reflux disease without esophagitis: Secondary | ICD-10-CM | POA: Diagnosis not present

## 2014-04-25 DIAGNOSIS — Z471 Aftercare following joint replacement surgery: Secondary | ICD-10-CM | POA: Diagnosis not present

## 2014-04-25 NOTE — Therapy (Signed)
[facility] [address] [place], [place], [address] Phone: [phone]   Fax:  [phone]  Physical Therapy Treatment  Patient Details  Name: [name] MRN: [id number] Date of Birth: 05/14/1933 Referring Provider:  Lupita Raider, MD  Encounter Date: 04/25/2014      PT End of Session - 04/25/14 0929    Visit Number 6   Number of Visits 12   Date for PT Re-Evaluation 04/17/14   PT Start Time 0900   PT Stop Time 0955   PT Time Calculation (min) 55 min   Activity Tolerance Patient tolerated treatment well   Behavior During Therapy [facility] for tasks assessed/performed      Past Medical History  Diagnosis Date  . Hypertension   . Heart murmur   . BPH (benign prostatic hypertrophy)   . GERD (gastroesophageal reflux disease)   . Anxiety   . Headache(784.0)     relieved with OTC's  . Dementia     early per pt- recently evaluated at [facility]- eccho7/13  , MRI head EPIC 6/13  . Bruises easily   . Unsteady gait   . Hyperlipidemia   . HBP (high blood pressure)   . Allergic rhinitis   . AI (aortic insufficiency)     Mild to Mod, Moderate AS Echo 02/2011  . Chronic fatigue   . Left carotid bruit 03/2008    w minimal obstruction on doppler  . OAB (overactive bladder)   . Dementia, vascular     Dr Terrace Arabia  . Coronary artery disease     s/p PCI of left cir and LAD 2005 and PCI of prox PDA 7/09  . Stroke     possible   . Sleep apnea     STOP BANG SCORE 5 pt states was recently tested stated tested negative  . Shortness of breath dyspnea     with ADLs  . Arthritis     Past Surgical History  Procedure Laterality Date  . Transurethral resection of prostate    . Turp vaporization    . Cardiac catheterization      3 stents first cath 2 stents placed then additional stent placed  . Hemorroidectomy    . Rotator cuff repair  3/13    right  . Tonsillectomy    . Knee arthroscopy  09/19/2011    Procedure: ARTHROSCOPY KNEE;   Surgeon: Jacki Cones, MD;  Location: [facility];  Service: Orthopedics;  Laterality: Left;  . Colonscopy     . Eye surgery      cataract surgery bilat   . Total knee arthroplasty Left 02/21/2014    Procedure: LEFT TOTAL KNEE ARTHROPLASTY;  Surgeon: Jacki Cones, MD;  Location: [facility];  Service: Orthopedics;  Laterality: Left;    There were no vitals filed for this visit.  Visit Diagnosis:  Left knee pain  Knee stiffness, left      Subjective Assessment - 04/25/14 0904    Symptoms no new complaints, only minor soreness reported   Limitations Walking   Currently in Pain? Yes   Pain Score 2    Pain Location Knee   Pain Descriptors / Indicators Sore   Pain Type Surgical pain   Aggravating Factors  rom   Pain Relieving Factors ice and rest            [facility] PT Assessment - 04/25/14 0001    AROM   Overall AROM  Deficits   AROM Assessment Site Knee  Right/Left Knee Left   Left Knee Flexion 115   PROM   Overall PROM  Deficits   PROM Assessment Site Knee   Right/Left Knee Left   Left Knee Flexion 120                   [facility] Adult PT Treatment/Exercise - 04/25/14 0001    Knee/Hip Exercises: Aerobic   Stationary Bike Nustep L5 x70min   Knee/Hip Exercises: Machines for Strengthening   Cybex Knee Extension 10# 3x10   Cybex Knee Flexion 30# 3x10   Cryotherapy   Number Minutes Cryotherapy 15 Minutes   Cryotherapy Location Knee   Type of Cryotherapy --  VASOPNEUMATIC DEVICE   Manual Therapy   Manual Therapy Passive ROM   Passive ROM low load holds for flexion                     PT Long Term Goals - 04/25/14 0909    PT LONG TERM GOAL #1   Title demonstrate or verbalize techniques to reduce the risk of re-injury to include info on: anti-inflammatory (rice method)   Status Achieved   PT LONG TERM GOAL #2   Title Independent with advanced HEP   Time 4   Period Weeks   Status Achieved   PT LONG TERM GOAL #3   Title Increase active LT  knee extension to 120-125 degrees   Time 4   Period Weeks   Status On-going   PT LONG TERM GOAL #4   Title perform a reciprocating stair gait with one railing with pain not >2-3/10   Time 4   Period Weeks   Status Achieved   PT LONG TERM GOAL #5   Title Decrease edema to within 2 cms of contralateral side to assist with ROM gains and decrease pain   Time 4   Period Weeks   Status Achieved   PT LONG TERM GOAL #6   Title Perform ADL's with pain not >2-3/10   Time 4   Period Weeks   Status Achieved               Plan - 04/25/14 0930    Clinical Impression Statement pt has met all goals excep ROM. pt would like to DC and will continue to work on ROM. Foto 46% limitation   Pt will benefit from skilled therapeutic intervention in order to improve on the following deficits Abnormal gait;Decreased range of motion;Increased edema   Rehab Potential Good   PT Frequency 3x / week   PT Duration 8 weeks   PT Treatment/Interventions Moist Heat;Cryotherapy;Electrical Stimulation;Functional mobility training;Manual techniques;Patient/family education;Therapeutic exercise;Gait training;Neuromuscular re-education   PT Next Visit Plan DC per pt   Consulted and Agree with Plan of Care Patient        Problem List Patient Active Problem List   Diagnosis Date Noted  . H/O total knee replacement 02/21/2014  . SOB (shortness of breath) 10/20/2013  . Hypersomnia 10/20/2013  . Edema 04/13/2013  . Coronary atherosclerosis of native coronary artery 04/12/2013  . Pure hypercholesterolemia 04/12/2013  . Aortic stenosis 04/12/2013  . Essential hypertension, benign 04/12/2013  . PNA (pneumonia) 03/21/2013  . Influenza with respiratory manifestations 03/19/2013  . Osteoarthritis of left knee 09/19/2011  . Lateral meniscus tear, current 09/19/2011  . Medial meniscus, posterior horn derangement 09/19/2011    PHYSICAL THERAPY DISCHARGE SUMMARY  Visits from Start of Care:   Current  functional level related to goals / functional outcomes:  Please  see above.    Remaining deficits:  All but ROM goal met.     Education / Equipment:  HEP.  Plan: Patient agrees to discharge.  Patient goals were partially met. Patient is being discharged due to the patient's request.  ?????      Tera Pellicane, [place] MPT 04/25/2014, 12:03 PM  [facility] [place], [place], [address] Phone: [phone]   Fax:  [phone]

## 2014-04-25 NOTE — Therapy (Signed)
[facility] [address] [place], [place], [address] Phone: [phone]   Fax:  [phone]  Physical Therapy Treatment  Patient Details  Name: [name] MRN: [id number] Date of Birth: 1933/11/13 Referring Provider:  Lupita Raider, MD  Encounter Date: 04/25/2014      PT End of Session - 04/25/14 0929    Visit Number 6   Number of Visits 12   Date for PT Re-Evaluation 04/17/14   PT Start Time 0900   PT Stop Time 0955   PT Time Calculation (min) 55 min   Activity Tolerance Patient tolerated treatment well   Behavior During Therapy [facility] for tasks assessed/performed      Past Medical History  Diagnosis Date  . Hypertension   . Heart murmur   . BPH (benign prostatic hypertrophy)   . GERD (gastroesophageal reflux disease)   . Anxiety   . Headache(784.0)     relieved with OTC's  . Dementia     early per pt- recently evaluated at [facility]- eccho7/13  , MRI head EPIC 6/13  . Bruises easily   . Unsteady gait   . Hyperlipidemia   . HBP (high blood pressure)   . Allergic rhinitis   . AI (aortic insufficiency)     Mild to Mod, Moderate AS Echo 02/2011  . Chronic fatigue   . Left carotid bruit 03/2008    w minimal obstruction on doppler  . OAB (overactive bladder)   . Dementia, vascular     Dr Terrace Arabia  . Coronary artery disease     s/p PCI of left cir and LAD 2005 and PCI of prox PDA 7/09  . Stroke     possible   . Sleep apnea     STOP BANG SCORE 5 pt states was recently tested stated tested negative  . Shortness of breath dyspnea     with ADLs  . Arthritis     Past Surgical History  Procedure Laterality Date  . Transurethral resection of prostate    . Turp vaporization    . Cardiac catheterization      3 stents first cath 2 stents placed then additional stent placed  . Hemorroidectomy    . Rotator cuff repair  3/13    right  . Tonsillectomy    . Knee arthroscopy  09/19/2011    Procedure: ARTHROSCOPY KNEE;   Surgeon: Jacki Cones, MD;  Location: [facility];  Service: Orthopedics;  Laterality: Left;  . Colonscopy     . Eye surgery      cataract surgery bilat   . Total knee arthroplasty Left 02/21/2014    Procedure: LEFT TOTAL KNEE ARTHROPLASTY;  Surgeon: Jacki Cones, MD;  Location: [facility];  Service: Orthopedics;  Laterality: Left;    There were no vitals filed for this visit.  Visit Diagnosis:  Left knee pain  Knee stiffness, left      Subjective Assessment - 04/25/14 0904    Symptoms no new complaints, only minor soreness reported   Limitations Walking   Currently in Pain? Yes   Pain Score 2    Pain Location Knee   Pain Descriptors / Indicators Sore   Pain Type Surgical pain   Aggravating Factors  rom   Pain Relieving Factors ice and rest            [facility] PT Assessment - 04/25/14 0001    AROM   Overall AROM  Deficits   AROM Assessment Site Knee  Right/Left Knee Left   Left Knee Flexion 115   PROM   Overall PROM  Deficits   PROM Assessment Site Knee   Right/Left Knee Left   Left Knee Flexion 120                   [facility] Adult PT Treatment/Exercise - 04/25/14 0001    Knee/Hip Exercises: Aerobic   Stationary Bike Nustep L5 x66min   Knee/Hip Exercises: Machines for Strengthening   Cybex Knee Extension 10# 3x10   Cybex Knee Flexion 30# 3x10   Cryotherapy   Number Minutes Cryotherapy 15 Minutes   Cryotherapy Location Knee   Type of Cryotherapy --  VASOPNEUMATIC DEVICE   Manual Therapy   Manual Therapy Passive ROM   Passive ROM low load holds for flexion                     PT Long Term Goals - 04/25/14 0909    PT LONG TERM GOAL #1   Title demonstrate or verbalize techniques to reduce the risk of re-injury to include info on: anti-inflammatory (rice method)   Status Achieved   PT LONG TERM GOAL #2   Title Independent with advanced HEP   Time 4   Period Weeks   Status Achieved   PT LONG TERM GOAL #3   Title Increase active LT  knee extension to 120-125 degrees   Time 4   Period Weeks   Status On-going   PT LONG TERM GOAL #4   Title perform a reciprocating stair gait with one railing with pain not >2-3/10   Time 4   Period Weeks   Status Achieved   PT LONG TERM GOAL #5   Title Decrease edema to within 2 cms of contralateral side to assist with ROM gains and decrease pain   Time 4   Period Weeks   Status Achieved   PT LONG TERM GOAL #6   Title Perform ADL's with pain not >2-3/10   Time 4   Period Weeks   Status Achieved               Plan - 04/25/14 0930    Clinical Impression Statement pt has met all goals excep ROM. pt would like to DC and will continue to work on ROM. Foto 46% limitation   Pt will benefit from skilled therapeutic intervention in order to improve on the following deficits Abnormal gait;Decreased range of motion;Increased edema   Rehab Potential Good   PT Frequency 3x / week   PT Duration 8 weeks   PT Treatment/Interventions Moist Heat;Cryotherapy;Electrical Stimulation;Functional mobility training;Manual techniques;Patient/family education;Therapeutic exercise;Gait training;Neuromuscular re-education   PT Next Visit Plan DC per pt   Consulted and Agree with Plan of Care Patient        Problem List Patient Active Problem List   Diagnosis Date Noted  . H/O total knee replacement 02/21/2014  . SOB (shortness of breath) 10/20/2013  . Hypersomnia 10/20/2013  . Edema 04/13/2013  . Coronary atherosclerosis of native coronary artery 04/12/2013  . Pure hypercholesterolemia 04/12/2013  . Aortic stenosis 04/12/2013  . Essential hypertension, benign 04/12/2013  . PNA (pneumonia) 03/21/2013  . Influenza with respiratory manifestations 03/19/2013  . Osteoarthritis of left knee 09/19/2011  . Lateral meniscus tear, current 09/19/2011  . Medial meniscus, posterior horn derangement 09/19/2011   Ladean Raya, PTA 04/25/2014 9:58 AM Kathlene Yano P, PTA 04/25/2014, 9:58  AM  [facility] [place],  [place], [address] Phone: [phone]   Fax:  [phone]

## 2014-04-26 ENCOUNTER — Other Ambulatory Visit: Payer: Self-pay

## 2014-04-26 ENCOUNTER — Other Ambulatory Visit: Payer: Self-pay | Admitting: *Deleted

## 2014-04-26 DIAGNOSIS — I350 Nonrheumatic aortic (valve) stenosis: Secondary | ICD-10-CM

## 2014-04-26 MED ORDER — CARVEDILOL 12.5 MG PO TABS: 12.5000 mg | ORAL_TABLET | Freq: Two times a day (BID) | ORAL | Status: DC

## 2014-05-10 ENCOUNTER — Other Ambulatory Visit: Payer: Self-pay | Admitting: Cardiology

## 2014-05-10 ENCOUNTER — Ambulatory Visit (INDEPENDENT_AMBULATORY_CARE_PROVIDER_SITE_OTHER): Payer: Commercial Managed Care - HMO | Admitting: Internal Medicine

## 2014-05-10 DIAGNOSIS — R0602 Shortness of breath: Secondary | ICD-10-CM

## 2014-05-10 DIAGNOSIS — R0600 Dyspnea, unspecified: Secondary | ICD-10-CM

## 2014-05-10 LAB — PULMONARY FUNCTION TEST
DL/VA % PRED: 85 %
DL/VA: 4.05 ml/min/mmHg/L
DLCO unc % pred: 67 %
DLCO unc: 24.64 ml/min/mmHg
FEF 25-75 PRE: 1.9 L/s
FEF 25-75 Post: 3.45 L/sec
FEF2575-%Change-Post: 81 %
FEF2575-%PRED-POST: 149 %
FEF2575-%Pred-Pre: 82 %
FEV1-%CHANGE-POST: 13 %
FEV1-%PRED-POST: 96 %
FEV1-%Pred-Pre: 85 %
FEV1-Post: 3.17 L
FEV1-Pre: 2.8 L
FEV1FVC-%Change-Post: 6 %
FEV1FVC-%PRED-PRE: 101 %
FEV6-%Change-Post: 7 %
FEV6-%PRED-POST: 93 %
FEV6-%Pred-Pre: 87 %
FEV6-PRE: 3.76 L
FEV6-Post: 4.03 L
FEV6FVC-%Change-Post: 0 %
FEV6FVC-%PRED-PRE: 104 %
FEV6FVC-%Pred-Post: 105 %
FVC-%Change-Post: 6 %
FVC-%Pred-Post: 88 %
FVC-%Pred-Pre: 83 %
FVC-PRE: 3.83 L
FVC-Post: 4.06 L
Post FEV1/FVC ratio: 78 %
Post FEV6/FVC ratio: 99 %
Pre FEV1/FVC ratio: 73 %
Pre FEV6/FVC Ratio: 98 %
RV % pred: 79 %
RV: 2.24 L
TLC % PRED: 80 %
TLC: 6.15 L

## 2014-05-10 NOTE — Progress Notes (Signed)
PFT done today. 

## 2014-05-16 DIAGNOSIS — Z471 Aftercare following joint replacement surgery: Secondary | ICD-10-CM | POA: Diagnosis not present

## 2014-05-16 DIAGNOSIS — Z96652 Presence of left artificial knee joint: Secondary | ICD-10-CM | POA: Diagnosis not present

## 2014-05-22 ENCOUNTER — Encounter: Payer: Self-pay | Admitting: Pulmonary Disease

## 2014-05-22 ENCOUNTER — Ambulatory Visit (INDEPENDENT_AMBULATORY_CARE_PROVIDER_SITE_OTHER): Payer: Commercial Managed Care - HMO | Admitting: Pulmonary Disease

## 2014-05-22 VITALS — BP 122/66 | HR 61 | Temp 97.1°F | Ht 73.0 in | Wt 251.2 lb

## 2014-05-22 DIAGNOSIS — R0602 Shortness of breath: Secondary | ICD-10-CM | POA: Diagnosis not present

## 2014-05-22 NOTE — Progress Notes (Signed)
   Subjective:    Patient ID: [name], male    DOB: 12-May-1933, 81 y.o.   MRN: [id number]  HPI The patient is a 81 year old male who I've been asked to see for dyspnea on exertion. He tells me that he has had shortness of breath with activity for a few years, but does not feel that it is progressive. He will typically get winded with taking showers, walking up one flight of stairs, or bringing groceries in from the car. He tells me that he has dyspnea with less than one block at a moderate pace. He has an intermittent cough with variable mucus production, but is usually clear in color. He denies any significant lower extremity edema, but tells me that he has gained over 10 pounds in the last one year. He has a history of cigars and pipe smoking for 20 years, but has not done so for 40 years. He has had a recent chest x-ray that showed mild cardiomegaly but no acute process. He has had an echocardiogram that showed moderate aortic stenosis and mild mitral regurgitation. He had a normal ejection fraction, mild diastolic dysfunction, and a mild left atrial dilatation. He has had recent pulmonary function studies that showed no airflow obstruction, but he did have a 13% improvement in FEV1 with bronchodilators. He has no restriction, and a mild decrease in diffusion capacity that corrects with alveolar volume adjustment.   Review of Systems  Constitutional: Negative for fever and unexpected weight change.  HENT: Positive for trouble swallowing. Negative for congestion, dental problem, ear pain, nosebleeds, postnasal drip, rhinorrhea, sinus pressure, sneezing and sore throat.   Eyes: Negative for redness and itching.  Respiratory: Positive for cough and shortness of breath. Negative for chest tightness and wheezing.   Cardiovascular: Negative for palpitations and leg swelling.  Gastrointestinal: Negative for nausea and vomiting.  Genitourinary: Negative for dysuria.  Musculoskeletal: Positive for  arthralgias. Negative for joint swelling.  Skin: Negative for rash.  Neurological: Negative for headaches.  Hematological: Does not bruise/bleed easily.  Psychiatric/Behavioral: Negative for dysphoric mood. The patient is not nervous/anxious.        Objective:   Physical Exam Constitutional:  Overweight male, no acute distress  HENT:  Nares patent without discharge  Oropharynx without exudate, palate and uvula are normal  Eyes:  Perrla, eomi, no scleral icterus  Neck:  No JVD, no TMG  Cardiovascular:  Normal rate, regular rhythm, no rubs or gallops.  3/6 sem        Intact distal pulses  Pulmonary :  Normal breath sounds, no stridor or respiratory distress   No rales, rhonchi, or wheezing  Abdominal:  Soft, nondistended, bowel sounds present.  No tenderness noted.   Musculoskeletal:  No lower extremity edema noted.  Lymph Nodes:  No cervical lymphadenopathy noted  Skin:  No cyanosis noted  Neurologic:  Alert, appropriate, moves all 4 extremities without obvious deficit.         Assessment & Plan:

## 2014-05-22 NOTE — Patient Instructions (Signed)
Will try you on striverdi, 2 inhalations each am for next 4 weeks.  Please call us in 4 weeks to let us know how you did on the medication.   Work on weight reduction and some type of conditioning program. Will arrange followup depending upon your response to the medication

## 2014-05-22 NOTE — Assessment & Plan Note (Signed)
The patient has chronic dyspnea on exertion over the last few years, but does not feel that it is progressive in nature. He has underlying valvular heart disease, and also has gained over 10 pounds in the last one year with a sedentary lifestyle. He has no history to suggest asthma, and has only smoked cigars during his life but none for the last 40 years. His pulmonary function studies do not show any airflow obstruction by usual criteria, and he does have a 13% improvement in his FEV1 with bronchodilators, as well as a 370 mL improvement. This is considered a positive bronchodilator response. He does not have any COPD by PFTs, but does appear to have some reversibility in airflows with bronchodilator. This can sometimes be seen in different types of airways disease, but his history really does not suggest asthma. I would like to try him on a LABA for the next 4 weeks to see if it significantly helps his breathing. If it does not, then I suspect this is not a significant issue for him. I have encouraged him to work on weight reduction and some type of conditioning program.

## 2014-06-22 ENCOUNTER — Telehealth: Payer: Self-pay | Admitting: Pulmonary Disease

## 2014-06-22 NOTE — Telephone Encounter (Signed)
Called and spoke to pt. Pt was seen on 4/11 and was given a Striverdi sample. Pt stated the Striverdi has not helped his breathing at all. Pt stated he get SOB with little activity.   Perkins please advise.

## 2014-06-23 NOTE — Telephone Encounter (Signed)
Spoke with Patient - aware of rec's per [facility]. Pt very SOB on the phone- advised to go to ER is SOB continues.  Aware that I will send this back to [facility] as FYI that the patient is still having increased SOB and is not wanting to go to ER. States that he is having a hard time understanding what is going on with his body if its not his lungs causing the SOB.

## 2014-06-23 NOTE — Telephone Encounter (Signed)
If the striverdi did not help at all, then I suspect his shortness of breath has nothing to do with his lungs.  His breathing studies showed minimal abnormality.  Would not put him on any other breathing medication, and would stop the striverdi.

## 2014-06-23 NOTE — Telephone Encounter (Signed)
Cardiac issues cause shortness of breath just as much as lung issues.  If he feels that his shortness of breath is worsening, he can go to the ER for further evaluation.

## 2014-06-26 NOTE — Telephone Encounter (Signed)
Pt is aware of KC's recommendation. Nothing further was needed.

## 2014-07-13 ENCOUNTER — Other Ambulatory Visit: Payer: Self-pay | Admitting: Cardiology

## 2014-07-20 ENCOUNTER — Other Ambulatory Visit: Payer: Self-pay | Admitting: Interventional Cardiology

## 2014-07-21 ENCOUNTER — Encounter: Payer: Self-pay | Admitting: Cardiology

## 2014-07-21 NOTE — Telephone Encounter (Signed)
Per note 3.7.16 

## 2014-08-07 ENCOUNTER — Other Ambulatory Visit: Payer: Self-pay

## 2014-08-08 DIAGNOSIS — Z471 Aftercare following joint replacement surgery: Secondary | ICD-10-CM | POA: Diagnosis not present

## 2014-08-08 DIAGNOSIS — Z96652 Presence of left artificial knee joint: Secondary | ICD-10-CM | POA: Diagnosis not present

## 2014-08-30 DIAGNOSIS — I5030 Unspecified diastolic (congestive) heart failure: Secondary | ICD-10-CM | POA: Diagnosis not present

## 2014-08-30 DIAGNOSIS — Z0001 Encounter for general adult medical examination with abnormal findings: Secondary | ICD-10-CM | POA: Diagnosis not present

## 2014-08-30 DIAGNOSIS — I110 Hypertensive heart disease with heart failure: Secondary | ICD-10-CM | POA: Diagnosis not present

## 2014-08-30 DIAGNOSIS — I25119 Atherosclerotic heart disease of native coronary artery with unspecified angina pectoris: Secondary | ICD-10-CM | POA: Diagnosis not present

## 2014-08-30 DIAGNOSIS — M150 Primary generalized (osteo)arthritis: Secondary | ICD-10-CM | POA: Diagnosis not present

## 2014-08-30 DIAGNOSIS — E669 Obesity, unspecified: Secondary | ICD-10-CM | POA: Diagnosis not present

## 2014-08-30 DIAGNOSIS — F0151 Vascular dementia with behavioral disturbance: Secondary | ICD-10-CM | POA: Diagnosis not present

## 2014-08-30 DIAGNOSIS — Z6832 Body mass index (BMI) 32.0-32.9, adult: Secondary | ICD-10-CM | POA: Diagnosis not present

## 2014-09-10 ENCOUNTER — Other Ambulatory Visit: Payer: Self-pay | Admitting: Cardiology

## 2014-10-20 ENCOUNTER — Ambulatory Visit (INDEPENDENT_AMBULATORY_CARE_PROVIDER_SITE_OTHER): Payer: Commercial Managed Care - HMO | Admitting: Cardiology

## 2014-10-20 ENCOUNTER — Encounter: Payer: Self-pay | Admitting: Cardiology

## 2014-10-20 VITALS — BP 110/62 | HR 55 | Ht 73.0 in | Wt 252.8 lb

## 2014-10-20 DIAGNOSIS — I10 Essential (primary) hypertension: Secondary | ICD-10-CM

## 2014-10-20 DIAGNOSIS — E7800 Pure hypercholesterolemia, unspecified: Secondary | ICD-10-CM

## 2014-10-20 DIAGNOSIS — I350 Nonrheumatic aortic (valve) stenosis: Secondary | ICD-10-CM

## 2014-10-20 DIAGNOSIS — I2510 Atherosclerotic heart disease of native coronary artery without angina pectoris: Secondary | ICD-10-CM | POA: Diagnosis not present

## 2014-10-20 LAB — BASIC METABOLIC PANEL
BUN: 16 mg/dL (ref 6–23)
CHLORIDE: 106 meq/L (ref 96–112)
CO2: 28 meq/L (ref 19–32)
Calcium: 9.3 mg/dL (ref 8.4–10.5)
Creatinine, Ser: 0.87 mg/dL (ref 0.40–1.50)
GFR: 89.84 mL/min (ref >60.00)
GLUCOSE: 100 mg/dL — AB (ref 70–99)
POTASSIUM: 3.8 meq/L (ref 3.5–5.1)
Sodium: 141 mEq/L (ref 135–145)

## 2014-10-20 LAB — HEPATIC FUNCTION PANEL
ALBUMIN: 4 g/dL (ref 3.5–5.2)
ALT: 17 U/L (ref 0–53)
AST: 18 U/L (ref 0–37)
Alkaline Phosphatase: 100 U/L (ref 39–117)
Bilirubin, Direct: 0.3 mg/dL (ref 0.0–0.3)
TOTAL PROTEIN: 6.8 g/dL (ref 6.0–8.3)
Total Bilirubin: 1.4 mg/dL — ABNORMAL HIGH (ref 0.2–1.2)

## 2014-10-20 LAB — LIPID PANEL
CHOLESTEROL: 141 mg/dL (ref 0–200)
HDL: 47.1 mg/dL (ref >39.00)
LDL CALC: 69 mg/dL (ref 0–99)
NonHDL: 94.25
TRIGLYCERIDES: 126 mg/dL (ref 0.0–149.0)
Total CHOL/HDL Ratio: 3
VLDL: 25.2 mg/dL (ref 0.0–40.0)

## 2014-10-20 MED ORDER — NITROGLYCERIN 0.4 MG SL SUBL: 0.4000 mg | SUBLINGUAL_TABLET | SUBLINGUAL | Status: DC | PRN

## 2014-10-20 NOTE — Patient Instructions (Signed)
Medication Instructions: - no changes (** your Nitroglycerin refill has been sent to the pharmacy today** )  Labwork: - Your physician recommends that you have lab work today: lipid/ liver/ bmp  Procedures/Testing: - none  Follow-Up: - Your physician wants you to follow-up in: 6 months with Dr. Radford Pax (March 2017) You will receive a reminder letter in the mail two months in advance. If you don't receive a letter, please call our office to schedule the follow-up appointment.  Any Additional Special Instructions Will Be Listed Below (If Applicable).

## 2014-10-20 NOTE — Progress Notes (Signed)
Cardiology Office Note   Date:  10/20/2014   ID:  [name], DOB 12/27/1933, MRN [id number]  PCP:  Martin Neer, MD    Chief Complaint  Patient presents with  . Atherosclerosis      History of Present Illness: [name] is a 81 y.o. male with a history of ASCAD s/p remote PCI of left circ/LAD and prox PDA, HTN, dyslipidemia, mild to mod AI and moderate AS who presents today for followup. He is doing well except for chronic fatigue. He denies any chest pain or pressure, dizziness, palpitations or syncope. He has chronic DOE and nuclear stress test showed no ischemia. The DOE has not changed.  He has chronic LE edema which is stable and lately has resolved.    Past Medical History  Diagnosis Date  . Hypertension   . Heart murmur   . BPH (benign prostatic hypertrophy)   . GERD (gastroesophageal reflux disease)   . Anxiety   . Headache(784.0)     relieved with OTC's  . Dementia     early per pt- recently evaluated at [facility]- eccho7/13  , MRI head EPIC 6/13  . Bruises easily   . Unsteady gait   . Hyperlipidemia   . HBP (high blood pressure)   . Allergic rhinitis   . AI (aortic insufficiency)     Mild to Mod, Moderate AS Echo 02/2011  . Chronic fatigue   . Left carotid bruit 03/2008    w minimal obstruction on doppler  . OAB (overactive bladder)   . Dementia, vascular     Dr [name]  . Coronary artery disease     s/p PCI of left cir and LAD 2005 and PCI of prox PDA 7/09  . Shortness of breath dyspnea     with ADLs  . Arthritis     Past Surgical History  Procedure Laterality Date  . Transurethral resection of prostate    . Turp vaporization    . Cardiac catheterization      3 stents first cath 2 stents placed then additional stent placed  . Hemorroidectomy    . Rotator cuff repair  3/13    right  . Tonsillectomy    . Knee arthroscopy  09/19/2011    Procedure: ARTHROSCOPY KNEE;  Surgeon: [name], MD;  Location: [facility];   Service: Orthopedics;  Laterality: Left;  . Colonscopy     . Eye surgery      cataract surgery bilat   . Total knee arthroplasty Left 02/21/2014    Procedure: LEFT TOTAL KNEE ARTHROPLASTY;  Surgeon: [name], MD;  Location: [facility];  Service: Orthopedics;  Laterality: Left;  . Stent placed      x3 per pt     Current Outpatient Prescriptions  Medication Sig Dispense Refill  . amLODipine (NORVASC) 5 MG tablet TAKE 1 TABLET (5 MG TOTAL) BY MOUTH DAILY. 30 tablet 1  . aspirin 81 MG tablet Take 81 mg by mouth daily.    . carvedilol (COREG) 12.5 MG tablet Take 1 tablet (12.5 mg total) by mouth 2 (two) times daily with a meal. 180 tablet 2  . clopidogrel (PLAVIX) 75 MG tablet Take 1 tablet (75 mg total) by mouth daily. 90 tablet 1  . DM-Doxylamine-Acetaminophen (NYQUIL COLD & FLU PO) Take 30 mLs by mouth at bedtime as needed (for cold and flu).    [name] Kitchen donepezil (  ARICEPT) 10 MG tablet Take 10 mg by mouth at bedtime.    . finasteride (PROSCAR) 5 MG tablet Take 5 mg by mouth daily.    . furosemide (LASIX) 20 MG tablet TAKE 1 TABLET EVERY DAY AFTER BREAKFAST 90 tablet 0  . losartan (COZAAR) 50 MG tablet TAKE 1 TABLET EVERY DAY 90 tablet 0  . Multiple Vitamin (MULTIVITAMIN WITH MINERALS) TABS Take 1 tablet by mouth daily.    . nitroGLYCERIN (NITROSTAT) 0.4 MG SL tablet Place 0.4 mg under the tongue every 5 (five) minutes as needed. For chest pain    . omeprazole (PRILOSEC) 40 MG capsule Take 1 capsule (40 mg total) by mouth daily. 90 capsule 3  . potassium chloride SA (K-DUR,KLOR-CON) 20 MEQ tablet TAKE 1 TABLET EVERY DAY 90 tablet 1  . rosuvastatin (CRESTOR) 20 MG tablet TAKE 1/2 TABLET EVERY DAY 45 tablet 1  . sertraline (ZOLOFT) 50 MG tablet Take 50 mg by mouth every morning.      No current facility-administered medications for this visit.    Allergies:   Latex and Sulfa antibiotics    Social History:  The patient  reports that he quit smoking about 44 years ago. His smoking use  included Cigars. He has quit using smokeless tobacco. His smokeless tobacco use included Chew. He reports that he drinks about 1.8 oz of alcohol per week. He reports that he does not use illicit drugs.   Family History:  The patient's family history includes Lung cancer in his father. There is no history of Anesthesia problems.    ROS:  Please see the history of present illness.   Otherwise, review of systems are positive for none.   All other systems are reviewed and negative.    PHYSICAL EXAM: VS:  BP 110/62 mmHg  Pulse 55  Ht 6\' 1"  (1.854 m)  Wt 252 lb 12.8 oz (114.669 kg)  BMI 33.36 kg/m2  SpO2 98% , BMI Body mass index is 33.36 kg/(m^2). GEN: Well nourished, well developed, in no acute distress HEENT: normal Neck: no JVD, carotid bruits, or masses Cardiac: RRR; no rubs, or gallops,no edema.  1/6 mid systolic murmur at RUSB Respiratory:  clear to auscultation bilaterally, normal work of breathing GI: soft, nontender, nondistended, + BS MS: no deformity or atrophy Skin: warm and dry, no rash Neuro:  Strength and sensation are intact Psych: euthymic mood, full affect   EKG:  EKG is not ordered today.    Recent Labs: 12/01/2013: Pro B Natriuretic peptide (BNP) 79.0 02/23/2014: BUN 13; Creatinine, Ser 0.74; Hemoglobin 12.6*; Platelets 159; Potassium 3.6; Sodium 129* 04/21/2014: ALT 16    Lipid Panel    Component Value Date/Time   CHOL 128 04/21/2014 0914   TRIG 68.0 04/21/2014 0914   HDL 47.80 04/21/2014 0914   CHOLHDL 3 04/21/2014 0914   VLDL 13.6 04/21/2014 0914   LDLCALC 67 04/21/2014 0914      Wt Readings from Last 3 Encounters:  10/20/14 252 lb 12.8 oz (114.669 kg)  05/22/14 251 lb 3.2 oz (113.944 kg)  04/17/14 246 lb (111.585 kg)      ASSESSMENT AND PLAN:   1. ASCAD - no anginal CP and nuclear stress test showed no ischemia - continue ASA/Plavix  2. HTN - controlled - continue Cozaar/Coreg/amlodipine  3. Dyslipidemia - LDL not at goal on last  lipid panel 6/15 which was 89 (goal<70) - Continue Crestor - he only can tolerate 10mg  daily - check fasting lipids and ALT 4. Mild AI  by echo 3/15       5.   Moderate AS - stable - no change from prior echo - repeat 04/2015  6. Chronic LE edema - stable  - continue Lasix   7. Hypersomnia with severe fatigue - no significant OSA on sleep study  8. Chronic SOB with normal stress test. His AS is only moderate so I doubt that is causing his symptoms.He saw Dr. Gwenette Greet who felt it was from weight gain.  Current medicines are reviewed at length with the patient today.  The patient does not have concerns regarding medicines.  The following changes have been made:  no change  Labs/ tests ordered today: See above Assessment and Plan No orders of the defined types were placed in this encounter.     Disposition:   FU with me in 6 months  Signed, Sueanne Margarita, MD  10/20/2014 10:30 AM    [facility] [address], [place], [place]  [address] Phone: [phone]; Fax: [phone]

## 2014-11-09 ENCOUNTER — Other Ambulatory Visit: Payer: Self-pay | Admitting: Cardiology

## 2014-11-09 DIAGNOSIS — H524 Presbyopia: Secondary | ICD-10-CM | POA: Diagnosis not present

## 2014-11-09 DIAGNOSIS — H5210 Myopia, unspecified eye: Secondary | ICD-10-CM | POA: Diagnosis not present

## 2014-11-15 ENCOUNTER — Other Ambulatory Visit: Payer: Self-pay | Admitting: Cardiology

## 2014-11-16 NOTE — Telephone Encounter (Signed)
I do not prescribe this - send to PCP

## 2014-12-13 ENCOUNTER — Other Ambulatory Visit: Payer: Self-pay | Admitting: Cardiology

## 2014-12-16 ENCOUNTER — Other Ambulatory Visit: Payer: Self-pay | Admitting: Cardiology

## 2014-12-26 ENCOUNTER — Other Ambulatory Visit: Payer: Self-pay | Admitting: Cardiology

## 2015-01-13 ENCOUNTER — Other Ambulatory Visit: Payer: Self-pay | Admitting: Cardiology

## 2015-01-13 ENCOUNTER — Other Ambulatory Visit: Payer: Self-pay | Admitting: Interventional Cardiology

## 2015-02-04 ENCOUNTER — Other Ambulatory Visit: Payer: Self-pay | Admitting: Cardiology

## 2015-02-12 ENCOUNTER — Other Ambulatory Visit: Payer: Self-pay | Admitting: Cardiology

## 2015-02-16 ENCOUNTER — Other Ambulatory Visit: Payer: Self-pay | Admitting: Family Medicine

## 2015-02-16 ENCOUNTER — Ambulatory Visit
Admission: RE | Admit: 2015-02-16 | Discharge: 2015-02-16 | Disposition: A | Discharge status: Home / Self Care | Payer: Commercial Managed Care - HMO | Source: Ambulatory Visit | Attending: Family Medicine | Admitting: Family Medicine

## 2015-02-16 DIAGNOSIS — R0600 Dyspnea, unspecified: Secondary | ICD-10-CM | POA: Diagnosis not present

## 2015-02-16 DIAGNOSIS — M79604 Pain in right leg: Secondary | ICD-10-CM | POA: Diagnosis not present

## 2015-02-16 DIAGNOSIS — R0609 Other forms of dyspnea: Secondary | ICD-10-CM | POA: Diagnosis not present

## 2015-02-23 ENCOUNTER — Emergency Department (HOSPITAL_COMMUNITY)
Admission: EM | Admit: 2015-02-23 | Discharge: 2015-02-23 | Disposition: A | Discharge status: Home / Self Care | Payer: Commercial Managed Care - HMO | Attending: Emergency Medicine | Admitting: Emergency Medicine

## 2015-02-23 ENCOUNTER — Emergency Department (HOSPITAL_COMMUNITY): Payer: Commercial Managed Care - HMO

## 2015-02-23 ENCOUNTER — Encounter (HOSPITAL_COMMUNITY): Payer: Self-pay

## 2015-02-23 DIAGNOSIS — Z91040 Latex allergy status: Secondary | ICD-10-CM | POA: Diagnosis not present

## 2015-02-23 DIAGNOSIS — Z7902 Long term (current) use of antithrombotics/antiplatelets: Secondary | ICD-10-CM | POA: Insufficient documentation

## 2015-02-23 DIAGNOSIS — J069 Acute upper respiratory infection, unspecified: Secondary | ICD-10-CM | POA: Diagnosis not present

## 2015-02-23 DIAGNOSIS — I10 Essential (primary) hypertension: Secondary | ICD-10-CM | POA: Diagnosis not present

## 2015-02-23 DIAGNOSIS — M1990 Unspecified osteoarthritis, unspecified site: Secondary | ICD-10-CM | POA: Insufficient documentation

## 2015-02-23 DIAGNOSIS — R011 Cardiac murmur, unspecified: Secondary | ICD-10-CM | POA: Insufficient documentation

## 2015-02-23 DIAGNOSIS — R05 Cough: Secondary | ICD-10-CM | POA: Diagnosis not present

## 2015-02-23 DIAGNOSIS — F0390 Unspecified dementia without behavioral disturbance: Secondary | ICD-10-CM | POA: Diagnosis not present

## 2015-02-23 DIAGNOSIS — E785 Hyperlipidemia, unspecified: Secondary | ICD-10-CM | POA: Insufficient documentation

## 2015-02-23 DIAGNOSIS — Z79899 Other long term (current) drug therapy: Secondary | ICD-10-CM | POA: Diagnosis not present

## 2015-02-23 DIAGNOSIS — I2510 Atherosclerotic heart disease of native coronary artery without angina pectoris: Secondary | ICD-10-CM | POA: Diagnosis not present

## 2015-02-23 DIAGNOSIS — Z87891 Personal history of nicotine dependence: Secondary | ICD-10-CM | POA: Diagnosis not present

## 2015-02-23 DIAGNOSIS — R140 Abdominal distension (gaseous): Secondary | ICD-10-CM | POA: Diagnosis not present

## 2015-02-23 DIAGNOSIS — R0602 Shortness of breath: Secondary | ICD-10-CM | POA: Diagnosis not present

## 2015-02-23 DIAGNOSIS — F419 Anxiety disorder, unspecified: Secondary | ICD-10-CM | POA: Diagnosis not present

## 2015-02-23 DIAGNOSIS — Z7982 Long term (current) use of aspirin: Secondary | ICD-10-CM | POA: Insufficient documentation

## 2015-02-23 LAB — HEPATIC FUNCTION PANEL
ALT: 17 U/L (ref 17–63)
AST: 20 U/L (ref 15–41)
Albumin: 3.4 g/dL — ABNORMAL LOW (ref 3.5–5.0)
Alkaline Phosphatase: 97 U/L (ref 38–126)
BILIRUBIN INDIRECT: 0.7 mg/dL (ref 0.3–0.9)
Bilirubin, Direct: 0.2 mg/dL (ref 0.1–0.5)
TOTAL PROTEIN: 6.3 g/dL — AB (ref 6.5–8.1)
Total Bilirubin: 0.9 mg/dL (ref 0.3–1.2)

## 2015-02-23 LAB — LIPASE, BLOOD: LIPASE: 24 U/L (ref 11–51)

## 2015-02-23 LAB — I-STAT TROPONIN, ED: Troponin i, poc: 0 ng/mL (ref 0.00–0.08)

## 2015-02-23 LAB — CBC
HEMATOCRIT: 43.4 % (ref 39.0–52.0)
HEMOGLOBIN: 15.1 g/dL (ref 13.0–17.0)
MCH: 31.5 pg (ref 26.0–34.0)
MCHC: 34.8 g/dL (ref 30.0–36.0)
MCV: 90.4 fL (ref 78.0–100.0)
Platelets: 210 10*3/uL (ref 150–400)
RBC: 4.8 MIL/uL (ref 4.22–5.81)
RDW: 13.2 % (ref 11.5–15.5)
WBC: 8.8 10*3/uL (ref 4.0–10.5)

## 2015-02-23 LAB — BASIC METABOLIC PANEL
ANION GAP: 12 (ref 5–15)
BUN: 11 mg/dL (ref 6–20)
CHLORIDE: 107 mmol/L (ref 101–111)
CO2: 20 mmol/L — AB (ref 22–32)
Calcium: 9.1 mg/dL (ref 8.9–10.3)
Creatinine, Ser: 0.95 mg/dL (ref 0.61–1.24)
GFR calc non Af Amer: >60 mL/min (ref >60)
Glucose, Bld: 135 mg/dL — ABNORMAL HIGH (ref 65–99)
Potassium: 4 mmol/L (ref 3.5–5.1)
Sodium: 139 mmol/L (ref 135–145)

## 2015-02-23 LAB — RAPID STREP SCREEN (MED CTR MEBANE ONLY): STREPTOCOCCUS, GROUP A SCREEN (DIRECT): NEGATIVE

## 2015-02-23 MED ORDER — ACETAMINOPHEN 325 MG PO TABS
650.0000 mg | ORAL_TABLET | Freq: Once | ORAL | Status: AC
  Administered 2015-02-23: 650 mg via ORAL
  Filled 2015-02-23: qty 2

## 2015-02-23 MED ORDER — DOXYCYCLINE HYCLATE 100 MG PO CAPS: 100.0000 mg | ORAL_CAPSULE | Freq: Two times a day (BID) | ORAL | Status: DC

## 2015-02-23 NOTE — ED Notes (Signed)
Pt has had a cough and cold since Monday but tonight it got worse with his coughing and SOB. Productive cough with yellow and brown mucus.

## 2015-02-23 NOTE — ED Provider Notes (Signed)
CSN: [id number]     Arrival date & time 02/23/15  0044 History  By signing my name below, I, Arianna Nassar, attest that this documentation has been prepared under the direction and in the presence of Merryl Hacker, MD. Electronically Signed: Julien Nordmann, ED Scribe. 02/23/2015. 3:50 AM.    Chief Complaint  Patient presents with  . Shortness of Breath  . Cough      The history is provided by the patient. No language interpreter was used.   HPI Comments: [name] is a 82 y.o. male who has a hx of HTN, heart murmur, 3 stents, BPH, GERD, dementia, hyperlipidemia presents to the Emergency Department complaining of constant, gradual worsening cold like symptoms onset 4 days ago. Pt has been having associated sore throat, abdominal pain, shortness of breath and productive cough that brings up brown and yellow mucus. Pt has been taking nyquil and other OTC cold medications to alleviate his symptoms with no relief. He went to his PCP earlier in the week for the same symptoms but did not receive any medication. Pt denies fever, vomiting, and diarrhea. Pt is a non-smoker.   Past Medical History  Diagnosis Date  . Hypertension   . Heart murmur   . BPH (benign prostatic hypertrophy)   . GERD (gastroesophageal reflux disease)   . Anxiety   . Headache(784.0)     relieved with OTC's  . Dementia     early per pt- recently evaluated at [facility]- eccho7/13  , MRI head EPIC 6/13  . Bruises easily   . Unsteady gait   . Hyperlipidemia   . HBP (high blood pressure)   . Allergic rhinitis   . AI (aortic insufficiency)     Mild to Mod, Moderate AS Echo 02/2011  . Chronic fatigue   . Left carotid bruit 03/2008    w minimal obstruction on doppler  . OAB (overactive bladder)   . Dementia, vascular     Dr [name]  . Coronary artery disease     s/p PCI of left cir and LAD 2005 and PCI of prox PDA 7/09  . Shortness of breath dyspnea     with ADLs  . Arthritis    Past Surgical History   Procedure Laterality Date  . Transurethral resection of prostate    . Turp vaporization    . Cardiac catheterization      3 stents first cath 2 stents placed then additional stent placed  . Hemorroidectomy    . Rotator cuff repair  3/13    right  . Tonsillectomy    . Knee arthroscopy  09/19/2011    Procedure: ARTHROSCOPY KNEE;  Surgeon: [name], MD;  Location: [facility];  Service: Orthopedics;  Laterality: Left;  . Colonscopy     . Eye surgery      cataract surgery bilat   . Total knee arthroplasty Left 02/21/2014    Procedure: LEFT TOTAL KNEE ARTHROPLASTY;  Surgeon: [name], MD;  Location: [facility];  Service: Orthopedics;  Laterality: Left;  . Stent placed      x3 per pt   Family History  Problem Relation Age of Onset  . Anesthesia problems Neg Hx   . Lung cancer Father    Social History  Substance Use Topics  . Smoking status: Former Smoker -- 20 years    Types: Cigars    Quit date: 02/10/1970  . Smokeless tobacco: Former Systems developer    Types: Loss adjuster, chartered  Comment: smoked cigars off and on x 20 years  . Alcohol Use: 1.8 oz/week    3 Glasses of wine per week     Comment: weekly/    occ beer/wine    Review of Systems  Constitutional: Positive for chills. Negative for fever.  Respiratory: Positive for cough and shortness of breath.   Cardiovascular: Negative for chest pain.  Gastrointestinal: Positive for abdominal pain. Negative for vomiting and diarrhea.  All other systems reviewed and are negative.     Allergies  Latex and Sulfa antibiotics  Home Medications   Prior to Admission medications   Medication Sig Start Date End Date Taking? Authorizing Provider  acetaminophen (TYLENOL) 325 MG tablet Take 650 mg by mouth every 6 (six) hours as needed for mild pain.   Yes Historical Provider, MD  amLODipine (NORVASC) 5 MG tablet TAKE 1 TABLET (5 MG TOTAL) BY MOUTH DAILY. 11/09/14  Yes Sueanne Margarita, MD  aspirin 81 MG tablet Take 81 mg by mouth daily.   Yes  Historical Provider, MD  carvedilol (COREG) 12.5 MG tablet TAKE 1 TABLET TWICE DAILY WITH MEALS 01/15/15  Yes Sueanne Margarita, MD  clopidogrel (PLAVIX) 75 MG tablet Take 1 tablet (75 mg total) by mouth daily. 04/24/14  Yes Sueanne Margarita, MD  DM-Doxylamine-Acetaminophen (NYQUIL COLD & FLU PO) Take 30 mLs by mouth at bedtime as needed (for cold and flu).   Yes Historical Provider, MD  donepezil (ARICEPT) 10 MG tablet Take 10 mg by mouth at bedtime.   Yes Historical Provider, MD  finasteride (PROSCAR) 5 MG tablet Take 5 mg by mouth daily.   Yes Historical Provider, MD  furosemide (LASIX) 20 MG tablet TAKE 1 TABLET EVERY DAY AFTER BREAKFAST 02/13/15  Yes Sueanne Margarita, MD  losartan (COZAAR) 50 MG tablet TAKE 1 TABLET EVERY DAY (NEED MD APPOINTMENT)  CALL  [phone] Patient taking differently: TAKE 1 TABLET BY MOUTH TWICE DAILY 02/13/15  Yes Sueanne Margarita, MD  Multiple Vitamin (MULTIVITAMIN WITH MINERALS) TABS Take 1 tablet by mouth daily.   Yes Historical Provider, MD  nitroGLYCERIN (NITROSTAT) 0.4 MG SL tablet Place 1 tablet (0.4 mg total) under the tongue every 5 (five) minutes as needed. For chest pain 10/20/14  Yes Sueanne Margarita, MD  omeprazole (PRILOSEC) 40 MG capsule Take 1 capsule (40 mg total) by mouth daily. 12/28/14  Yes Sueanne Margarita, MD  potassium chloride SA (K-DUR,KLOR-CON) 20 MEQ tablet Take 1 tablet (20 mEq total) by mouth daily. 12/13/14  Yes Sueanne Margarita, MD  rosuvastatin (CRESTOR) 20 MG tablet Take 0.5 tablets (10 mg total) by mouth daily. 01/15/15  Yes Sueanne Margarita, MD  sertraline (ZOLOFT) 50 MG tablet Take 50 mg by mouth every morning.    Yes Historical Provider, MD  doxycycline (VIBRAMYCIN) 100 MG capsule Take 1 capsule (100 mg total) by mouth 2 (two) times daily. 02/23/15   Merryl Hacker, MD   Triage vitals: BP 118/70 mmHg  Pulse 60  Temp(Src) 97.7 F (36.5 C) (Oral)  Resp 15  Ht 6\' 2"  (1.88 m)  Wt 252 lb (114.306 kg)  BMI 32.34 kg/m2  SpO2 97% Physical Exam   Constitutional: He is oriented to person, place, and time. He appears well-developed and well-nourished. No distress.  HENT:  Head: Normocephalic and atraumatic.  Mouth/Throat: No oropharyngeal exudate.  Nasal congestion noted, mild oropharyngeal errythema, uvula midline, no tonsillar enlargement  Eyes: Pupils are equal, round, and reactive to light.  Cardiovascular: Normal  rate, regular rhythm and normal heart sounds.   No murmur heard. Pulmonary/Chest: Effort normal and breath sounds normal. No respiratory distress. He has no wheezes.  Abdominal: Soft. Bowel sounds are normal. There is no tenderness. There is no rebound.  Musculoskeletal: He exhibits no edema.  Neurological: He is alert and oriented to person, place, and time.  Skin: Skin is warm and dry.  Psychiatric: He has a normal mood and affect.  Nursing note and vitals reviewed.   ED Course  Procedures  DIAGNOSTIC STUDIES: Oxygen Saturation is 97% on RA, normal by my interpretation.  COORDINATION OF CARE:  3:47 AM Discussed treatment plan with pt at bedside and pt agreed to plan.  Labs Review Labs Reviewed  BASIC METABOLIC PANEL - Abnormal; Notable for the following:    CO2 20 (*)    Glucose, Bld 135 (*)    All other components within normal limits  HEPATIC FUNCTION PANEL - Abnormal; Notable for the following:    Total Protein 6.3 (*)    Albumin 3.4 (*)    All other components within normal limits  RAPID STREP SCREEN (NOT AT [facility])  CULTURE, GROUP A STREP ([place])  CBC  LIPASE, BLOOD  I-STAT TROPOININ, ED    Imaging Review Dg Chest 2 View  02/23/2015  CLINICAL DATA:  Shortness of breath and productive cough for 2 months. EXAM: CHEST  2 VIEW COMPARISON:  02/16/2015 FINDINGS: The cardiomediastinal contours are unchanged, mild tortuosity of the thoracic aorta. Mild interstitial prominence and borderline hyperinflation, unchanged. Calcified granuloma in the left upper lung. Pulmonary vasculature is normal. No  consolidation, pleural effusion, or pneumothorax. No acute osseous abnormalities are seen. IMPRESSION: No acute pulmonary process. Electronically Signed   By: Jeb Levering M.D.   On: 02/23/2015 02:14   Dg Abd 1 View  02/23/2015  CLINICAL DATA:  82 year old male with abdominal distention. EXAM: ABDOMEN - 1 VIEW COMPARISON:  Radiograph dated 08/05/2013 FINDINGS: No evidence of bowel obstruction. Moderate stool throughout the colon. No radiopaque calculi identified. There is calcification of the splenic artery. The osseous structures appear unremarkable. IMPRESSION: No bowel obstruction. Electronically Signed   By: Anner Crete M.D.   On: 02/23/2015 05:44   I have personally reviewed and evaluated these images and lab results as part of my medical decision-making.   EKG Interpretation   Date/Time:  Friday February 23 2015 00:48:20 EST Ventricular Rate:  70 PR Interval:  152 QRS Duration: 88 QT Interval:  406 QTC Calculation: 438 R Axis:   -8 Text Interpretation:  Normal sinus rhythm Minimal voltage criteria for  LVH, may be normal variant Nonspecific ST and T wave abnormality Abnormal  ECG Confirmed by Keylen Uzelac  MD, Zionah Criswell ([id number]) on 02/23/2015 3:20:25 AM Also  confirmed by Quintasha Gren  MD, Melissia Lahman ([id number]), editor [place]  CCT, BEVERLY  ([id number])  on 02/23/2015 7:42:06 AM      MDM   Final diagnoses:  Upper respiratory infection   Patient presents with upper respiratory symptoms.  No fevers. VS reassuring.  NO respiratory distress.  No wheezing.  Basic labwork reassuring.  Chest and abdominal films without evidence of PNA.  HOwever, given patient's age and symptoms, chest xray could clinically lag behind.  GIven duration of symptoms, will treat empirically for PNA.  Discussed further supportive measures at home.  After history, exam, and medical workup I feel the patient has been appropriately medically screened and is safe for discharge home. Pertinent diagnoses were discussed with the  patient. Patient was given return  precautions.  I personally performed the services described in this documentation, which was scribed in my presence. The recorded information has been reviewed and is accurate.   Merryl Hacker, MD 02/23/15 [phone]

## 2015-02-23 NOTE — Discharge Instructions (Signed)
Upper Respiratory Infection, Adult Most upper respiratory infections (URIs) are a viral infection of the air passages leading to the lungs. A URI affects the nose, throat, and upper air passages. The most common type of URI is nasopharyngitis and is typically referred to as "the common cold." URIs run their course and usually go away on their own. Most of the time, a URI does not require medical attention, but sometimes a bacterial infection in the upper airways can follow a viral infection. This is called a secondary infection. Sinus and middle ear infections are common types of secondary upper respiratory infections. Bacterial pneumonia can also complicate a URI. A URI can worsen asthma and chronic obstructive pulmonary disease (COPD). Sometimes, these complications can require emergency medical care and may be life threatening.  CAUSES Almost all URIs are caused by viruses. A virus is a type of germ and can spread from one person to another.  RISKS FACTORS You may be at risk for a URI if:   You smoke.   You have chronic heart or lung disease.  You have a weakened defense (immune) system.   You are very young or very old.   You have nasal allergies or asthma.  You work in crowded or poorly ventilated areas.  You work in health care facilities or schools. SIGNS AND SYMPTOMS  Symptoms typically develop 2-3 days after you come in contact with a cold virus. Most viral URIs last 7-10 days. However, viral URIs from the influenza virus (flu virus) can last 14-18 days and are typically more severe. Symptoms may include:   Runny or stuffy (congested) nose.   Sneezing.   Cough.   Sore throat.   Headache.   Fatigue.   Fever.   Loss of appetite.   Pain in your forehead, behind your eyes, and over your cheekbones (sinus pain).  Muscle aches.  DIAGNOSIS  Your health care provider may diagnose a URI by:  Physical exam.  Tests to check that your symptoms are not due to  another condition such as:  Strep throat.  Sinusitis.  Pneumonia.  Asthma. TREATMENT  A URI goes away on its own with time. It cannot be cured with medicines, but medicines may be prescribed or recommended to relieve symptoms. Medicines may help:  Reduce your fever.  Reduce your cough.  Relieve nasal congestion. HOME CARE INSTRUCTIONS   Take medicines only as directed by your health care provider.   Gargle warm saltwater or take cough drops to comfort your throat as directed by your health care provider.  Use a warm mist humidifier or inhale steam from a shower to increase air moisture. This may make it easier to breathe.  Drink enough fluid to keep your urine clear or pale yellow.   Eat soups and other clear broths and maintain good nutrition.   Rest as needed.   Return to work when your temperature has returned to normal or as your health care provider advises. You may need to stay home longer to avoid infecting others. You can also use a face mask and careful hand washing to prevent spread of the virus.  Increase the usage of your inhaler if you have asthma.   Do not use any tobacco products, including cigarettes, chewing tobacco, or electronic cigarettes. If you need help quitting, ask your health care provider. PREVENTION  The best way to protect yourself from getting a cold is to practice good hygiene.   Avoid oral or hand contact with people with cold   symptoms.   Wash your hands often if contact occurs.  There is no clear evidence that vitamin C, vitamin E, echinacea, or exercise reduces the chance of developing a cold. However, it is always recommended to get plenty of rest, exercise, and practice good nutrition.  SEEK MEDICAL CARE IF:   You are getting worse rather than better.   Your symptoms are not controlled by medicine.   You have chills.  You have worsening shortness of breath.  You have brown or red mucus.  You have yellow or brown nasal  discharge.  You have pain in your face, especially when you bend forward.  You have a fever.  You have swollen neck glands.  You have pain while swallowing.  You have white areas in the back of your throat. SEEK IMMEDIATE MEDICAL CARE IF:   You have severe or persistent:  Headache.  Ear pain.  Sinus pain.  Chest pain.  You have chronic lung disease and any of the following:  Wheezing.  Prolonged cough.  Coughing up blood.  A change in your usual mucus.  You have a stiff neck.  You have changes in your:  Vision.  Hearing.  Thinking.  Mood. MAKE SURE YOU:   Understand these instructions.  Will watch your condition.  Will get help right away if you are not doing well or get worse.   This information is not intended to replace advice given to you by your health care provider. Make sure you discuss any questions you have with your health care provider.   Document Released: 07/23/2000 Document Revised: 06/13/2014 Document Reviewed: 05/04/2013 Elsevier Interactive Patient Education 2016 [employer]vier Inc.  

## 2015-02-26 LAB — CULTURE, GROUP A STREP (THRC)

## 2015-03-05 DIAGNOSIS — F0151 Vascular dementia with behavioral disturbance: Secondary | ICD-10-CM | POA: Diagnosis not present

## 2015-03-05 DIAGNOSIS — I5030 Unspecified diastolic (congestive) heart failure: Secondary | ICD-10-CM | POA: Diagnosis not present

## 2015-03-05 DIAGNOSIS — E7800 Pure hypercholesterolemia, unspecified: Secondary | ICD-10-CM | POA: Diagnosis not present

## 2015-03-05 DIAGNOSIS — I110 Hypertensive heart disease with heart failure: Secondary | ICD-10-CM | POA: Diagnosis not present

## 2015-03-05 DIAGNOSIS — I25119 Atherosclerotic heart disease of native coronary artery with unspecified angina pectoris: Secondary | ICD-10-CM | POA: Diagnosis not present

## 2015-03-05 DIAGNOSIS — J45909 Unspecified asthma, uncomplicated: Secondary | ICD-10-CM | POA: Diagnosis not present

## 2015-03-05 DIAGNOSIS — L570 Actinic keratosis: Secondary | ICD-10-CM | POA: Diagnosis not present

## 2015-03-05 DIAGNOSIS — Z6833 Body mass index (BMI) 33.0-33.9, adult: Secondary | ICD-10-CM | POA: Diagnosis not present

## 2015-03-05 DIAGNOSIS — E669 Obesity, unspecified: Secondary | ICD-10-CM | POA: Diagnosis not present

## 2015-03-22 NOTE — Therapy (Signed)
[facility] [place], [place], [address] Phone: [phone]   Fax:  [phone]  Physical Therapy Treatment  Patient Details  Name: DORRIAN DOGGETT MRN: [id number] Date of Birth: [birth year] No Data Recorded  Encounter Date: 04/25/2014    Past Medical History  Diagnosis Date  . Hypertension   . Heart murmur   . BPH (benign prostatic hypertrophy)   . GERD (gastroesophageal reflux disease)   . Anxiety   . Headache(784.0)     relieved with OTC's  . Dementia     early per pt- recently evaluated at [facility] Neuro- eccho7/13  , MRI head EPIC 6/13  . Bruises easily   . Unsteady gait   . Hyperlipidemia   . HBP (high blood pressure)   . Allergic rhinitis   . AI (aortic insufficiency)     Mild to Mod, Moderate AS Echo 02/2011  . Chronic fatigue   . Left carotid bruit 03/2008    w minimal obstruction on doppler  . OAB (overactive bladder)   . Dementia, vascular     Dr [name]  . Coronary artery disease     s/p PCI of left cir and LAD 2005 and PCI of prox PDA 7/09  . Shortness of breath dyspnea     with ADLs  . Arthritis     Past Surgical History  Procedure Laterality Date  . Transurethral resection of prostate    . Turp vaporization    . Cardiac catheterization      3 stents first cath 2 stents placed then additional stent placed  . Hemorroidectomy    . Rotator cuff repair  3/13    right  . Tonsillectomy    . Knee arthroscopy  09/19/2011    Procedure: ARTHROSCOPY KNEE;  Surgeon: [name], MD;  Location: [facility];  Service: Orthopedics;  Laterality: Left;  . Colonscopy     . Eye surgery      cataract surgery bilat   . Total knee arthroplasty Left 02/21/2014    Procedure: LEFT TOTAL KNEE ARTHROPLASTY;  Surgeon: [name], MD;  Location: [facility];  Service: Orthopedics;  Laterality: Left;  . Stent placed      x3 per pt    There were no vitals filed for this visit.  Visit Diagnosis:  Left knee pain  Knee  stiffness, left                                    PT Long Term Goals - 04/25/14 0909    PT LONG TERM GOAL #1   Title demonstrate or verbalize techniques to reduce the risk of re-injury to include info on: anti-inflammatory (rice method)   Status Achieved   PT LONG TERM GOAL #2   Title Independent with advanced HEP   Time 4   Period Weeks   Status Achieved   PT LONG TERM GOAL #3   Title Increase active LT knee flexion to 120-125 degrees   Time 4   Period Weeks   Status On-going   PT LONG TERM GOAL #4   Title perform a reciprocating stair gait with one railing with pain not >2-3/10   Time 4   Period Weeks   Status Achieved   PT LONG TERM GOAL #5   Title Decrease edema to within 2 cms of contralateral side to assist with ROM gains and decrease pain   Time  4   Period Weeks   Status Achieved   PT LONG TERM GOAL #6   Title Perform ADL's with pain not >2-3/10   Time 4   Period Weeks   Status Achieved               Problem List Patient Active Problem List   Diagnosis Date Noted  . H/O total knee replacement 02/21/2014  . SOB (shortness of breath) 10/20/2013  . Hypersomnia 10/20/2013  . Edema 04/13/2013  . Coronary atherosclerosis of native coronary artery 04/12/2013  . Pure hypercholesterolemia 04/12/2013  . Aortic stenosis 04/12/2013  . Essential hypertension, benign 04/12/2013  . PNA (pneumonia) 03/21/2013  . Influenza with respiratory manifestations 03/19/2013  . Osteoarthritis of left knee 09/19/2011  . Lateral meniscus tear, current 09/19/2011  . Medial meniscus, posterior horn derangement 09/19/2011   PHYSICAL THERAPY DISCHARGE SUMMARY  Visits from Start of Care:   Current functional level related to goals / functional outcomes: Please see above.   Remaining deficits: Goals #3 unmet.   Education / Equipment: HEP.  Plan: Patient agrees to discharge.  Patient goals were partially met. Patient is being discharged due  to being pleased with the current functional level.  ?????      Maely Clements, [place] MPT 03/22/2015, 1:46 PM  [facility] [address] [place], [place], [address] Phone: [phone]   Fax:  [phone]  Name: MAKELL CYR MRN: [id number] Date of Birth: 1933-02-14

## 2015-04-04 ENCOUNTER — Other Ambulatory Visit: Payer: Self-pay | Admitting: Cardiology

## 2015-04-16 ENCOUNTER — Telehealth: Payer: Self-pay | Admitting: Cardiology

## 2015-04-16 NOTE — Telephone Encounter (Signed)
The pt c/o SOB with exertion and states that "I cant even walk to the bathroom without having to sit down to catch my breath". He denies any edema and states that he sleeps on only 1 pillow at night. I advised him that I am going to discuss his s/s with our DOD but he stopped me while I was talking and said "I called because I want to be seen by my heart doctor soon and they said I couldn't come in until 3/14 and that is too long for me to wait". The pt has been given an appt to see Angelena Form, PA-c on 04/18/15 at 11 am. He is in agreement with date and time of OV and thanked me for calling him back.

## 2015-04-16 NOTE — Telephone Encounter (Signed)
Pt c/o Shortness Of Breath: STAT if SOB developed within the last 24 hours or pt is noticeably SOB on the phone  1. Are you currently SOB (can you hear that pt is SOB on the phone)? No- is sitting   2. How long have you been experiencing SOB? "a while" would not elaborate  3. Are you SOB when sitting or when up moving around? Moving around  4. Are you currently experiencing any other symptoms? sweating

## 2015-04-18 ENCOUNTER — Encounter: Payer: Self-pay | Admitting: *Deleted

## 2015-04-18 ENCOUNTER — Encounter: Payer: Self-pay | Admitting: Physician Assistant

## 2015-04-18 ENCOUNTER — Ambulatory Visit (INDEPENDENT_AMBULATORY_CARE_PROVIDER_SITE_OTHER): Payer: Commercial Managed Care - HMO | Admitting: Physician Assistant

## 2015-04-18 VITALS — BP 142/78 | HR 56 | Ht 74.0 in | Wt 253.6 lb

## 2015-04-18 DIAGNOSIS — I10 Essential (primary) hypertension: Secondary | ICD-10-CM

## 2015-04-18 DIAGNOSIS — R0602 Shortness of breath: Secondary | ICD-10-CM

## 2015-04-18 DIAGNOSIS — I2510 Atherosclerotic heart disease of native coronary artery without angina pectoris: Secondary | ICD-10-CM | POA: Diagnosis not present

## 2015-04-18 DIAGNOSIS — I350 Nonrheumatic aortic (valve) stenosis: Secondary | ICD-10-CM | POA: Diagnosis not present

## 2015-04-18 DIAGNOSIS — I2583 Coronary atherosclerosis due to lipid rich plaque: Secondary | ICD-10-CM

## 2015-04-18 LAB — CBC WITH DIFFERENTIAL/PLATELET
BASOS PCT: 0 % (ref 0–1)
Basophils Absolute: 0 10*3/uL (ref 0.0–0.1)
Eosinophils Absolute: 0.1 10*3/uL (ref 0.0–0.7)
Eosinophils Relative: 2 % (ref 0–5)
HEMATOCRIT: 48.1 % (ref 39.0–52.0)
HEMOGLOBIN: 16.3 g/dL (ref 13.0–17.0)
LYMPHS ABS: 1.1 10*3/uL (ref 0.7–4.0)
LYMPHS PCT: 18 % (ref 12–46)
MCH: 31.2 pg (ref 26.0–34.0)
MCHC: 33.9 g/dL (ref 30.0–36.0)
MCV: 92 fL (ref 78.0–100.0)
MONO ABS: 0.5 10*3/uL (ref 0.1–1.0)
MONOS PCT: 9 % (ref 3–12)
MPV: 10.4 fL (ref 8.6–12.4)
NEUTROS ABS: 4.2 10*3/uL (ref 1.7–7.7)
NEUTROS PCT: 71 % (ref 43–77)
Platelets: 220 10*3/uL (ref 150–400)
RBC: 5.23 MIL/uL (ref 4.22–5.81)
RDW: 14 % (ref 11.5–15.5)
WBC: 5.9 10*3/uL (ref 4.0–10.5)

## 2015-04-18 LAB — BASIC METABOLIC PANEL
BUN: 15 mg/dL (ref 7–25)
CHLORIDE: 105 mmol/L (ref 98–110)
CO2: 25 mmol/L (ref 20–31)
CREATININE: 1.03 mg/dL (ref 0.70–1.11)
Calcium: 9.3 mg/dL (ref 8.6–10.3)
GLUCOSE: 101 mg/dL — AB (ref 65–99)
POTASSIUM: 3.8 mmol/L (ref 3.5–5.3)
Sodium: 141 mmol/L (ref 135–146)

## 2015-04-18 NOTE — Patient Instructions (Addendum)
Medication Instructions:  Your physician recommends that you continue on your current medications as directed. Please refer to the Current Medication list given to you today.  Labwork: TODAY:  BMET, PT/INR, CBC W/DIFF  Testing/Procedures: Your physician has requested that you have a cardiac catheterization. Cardiac catheterization is used to diagnose and/or treat various heart conditions. Doctors may recommend this procedure for a number of different reasons. The most common reason is to evaluate chest pain. Chest pain can be a symptom of coronary artery disease (CAD), and cardiac catheterization can show whether plaque is narrowing or blocking your heart's arteries. This procedure is also used to evaluate the valves, as well as measure the blood flow and oxygen levels in different parts of your heart. For further information please visit [email or web address]. Please follow instruction sheet, as given.    Follow-Up: Your physician recommends that you schedule a follow-up appointment:  WILL BE BASED UPON YOUR PROCEDURE   Any Other Special Instructions Will Be Listed Below (If Applicable).     If you need a refill on your cardiac medications before your next appointment, please call your pharmacy.

## 2015-04-18 NOTE — Progress Notes (Signed)
Cardiology Office Note   Date:  04/18/2015   ID:  DONTAI TILLEY, DOB 1934-02-03, MRN [id number]  PCP:  [name], MD  Cardiologist:  Dr. Radford Pax   CC: worsening SOB    History of Present Illness: KHAMARI TERNES is a 82 y.o. male with a history of CAD s/p remote PCI/DES of left circ/LAD and prox PDA (2005), HTN, mild dementia, HLD, mild to mod AI and moderate AS who presents today for evaluation of worsening SOB.  He was seen by Dr. Radford Pax in 09/2014 for cardiology follow up. He has chronic fatigue and SOB and this was felt to be stable. He had a stress test for evaluation of SOB in 10/2013 which was normal. BNP also ordered which was normal. He has also seen Dr. Gwenette Greet in the past who felt his SOB was from obesity. Sleep study negative for OSA.  He called our office on 04/16/15 complaining, that "I cant even walk to the bathroom without having to sit down to catch my breath" and wanted to be seen sooner than next week.   He was added onto my schedule for evaluation. This has been going on for over two years but it seems like this is getting worse. Every time he takes a bath or uses the bathroom he is so short of breath he has to sit down. It is made worse with bending over and standing back up. He has had no chest pain. No dizziness or syncope. No LE edema, orthopnea or PND.     Past Medical History  Diagnosis Date  . Hypertension   . Heart murmur   . BPH (benign prostatic hypertrophy)   . GERD (gastroesophageal reflux disease)   . Anxiety   . Headache(784.0)     relieved with OTC's  . Dementia     early per pt- recently evaluated at [facility]- eccho7/13  , MRI head EPIC 6/13  . Bruises easily   . Unsteady gait   . Hyperlipidemia   . HBP (high blood pressure)   . Allergic rhinitis   . AI (aortic insufficiency)     Mild to Mod, Moderate AS Echo 02/2011  . Chronic fatigue   . Left carotid bruit 03/2008    w minimal obstruction on doppler  . OAB (overactive bladder)    . Dementia, vascular     Dr [name]  . Coronary artery disease     s/p PCI of left cir and LAD 2005 and PCI of prox PDA 7/09  . Shortness of breath dyspnea     with ADLs  . Arthritis     Past Surgical History  Procedure Laterality Date  . Transurethral resection of prostate    . Turp vaporization    . Cardiac catheterization      3 stents first cath 2 stents placed then additional stent placed  . Hemorroidectomy    . Rotator cuff repair  3/13    right  . Tonsillectomy    . Knee arthroscopy  09/19/2011    Procedure: ARTHROSCOPY KNEE;  Surgeon: [name], MD;  Location: [facility];  Service: Orthopedics;  Laterality: Left;  . Colonscopy     . Eye surgery      cataract surgery bilat   . Total knee arthroplasty Left 02/21/2014    Procedure: LEFT TOTAL KNEE ARTHROPLASTY;  Surgeon: [name], MD;  Location: [facility];  Service: Orthopedics;  Laterality: Left;  . Stent placed      x3  per pt     Current Outpatient Prescriptions  Medication Sig Dispense Refill  . acetaminophen (TYLENOL) 325 MG tablet Take 650 mg by mouth every 6 (six) hours as needed for mild pain.    [name] Kitchen amLODipine (NORVASC) 5 MG tablet TAKE 1 TABLET (5 MG TOTAL) BY MOUTH DAILY. 30 tablet 5  . aspirin 81 MG tablet Take 81 mg by mouth daily.    . carvedilol (COREG) 12.5 MG tablet TAKE 1 TABLET TWICE DAILY WITH MEALS 180 tablet 0  . clopidogrel (PLAVIX) 75 MG tablet Take 1 tablet (75 mg total) by mouth daily. 90 tablet 1  . donepezil (ARICEPT) 10 MG tablet Take 10 mg by mouth at bedtime.    [name] Kitchen doxycycline (VIBRAMYCIN) 100 MG capsule Take 1 capsule (100 mg total) by mouth 2 (two) times daily. 20 capsule 0  . finasteride (PROSCAR) 5 MG tablet Take 5 mg by mouth daily.    . furosemide (LASIX) 20 MG tablet TAKE 1 TABLET EVERY DAY AFTER BREAKFAST 90 tablet 0  . losartan (COZAAR) 50 MG tablet Take 50 mg by mouth 2 (two) times daily.    . Multiple Vitamin (MULTIVITAMIN WITH MINERALS) TABS Take 1 tablet by mouth daily.    .  nitroGLYCERIN (NITROSTAT) 0.4 MG SL tablet Place 1 tablet (0.4 mg total) under the tongue every 5 (five) minutes as needed. For chest pain 25 tablet 3  . omeprazole (PRILOSEC) 40 MG capsule Take 1 capsule (40 mg total) by mouth daily. 90 capsule 2  . potassium chloride SA (K-DUR,KLOR-CON) 20 MEQ tablet Take 1 tablet (20 mEq total) by mouth daily. 90 tablet 2  . rosuvastatin (CRESTOR) 20 MG tablet Take 0.5 tablets (10 mg total) by mouth daily. 45 tablet 2  . sertraline (ZOLOFT) 50 MG tablet Take 50 mg by mouth every morning.      No current facility-administered medications for this visit.    Allergies:   Latex and Sulfa antibiotics    Social History:  The patient  reports that he quit smoking about 45 years ago. His smoking use included Cigars. He has quit using smokeless tobacco. His smokeless tobacco use included Chew. He reports that he drinks about 1.8 oz of alcohol per week. He reports that he does not use illicit drugs.   Family History:  The patient's family history includes Lung cancer in his father. There is no history of Anesthesia problems or Heart attack.    ROS:  Please see the history of present illness.   Otherwise, review of systems are positive for NONE.   All other systems are reviewed and negative.    PHYSICAL EXAM: VS:  BP 142/78 mmHg  Pulse 56  Ht 6\' 2"  (1.88 m)  Wt 253 lb 9.6 oz (115.032 kg)  BMI 32.55 kg/m2  SpO2 98% , BMI Body mass index is 32.55 kg/(m^2). GEN: Well nourished, well developed, in no acute distressobese HEENT: normal Neck: no JVD, carotid bruits, or masses Cardiac: brady; no murmurs, rubs, or gallops,no edema  Respiratory:  clear to auscultation bilaterally, normal work of breathing GI: soft, nontender, nondistended, + BS MS: no deformity or atrophy Skin: warm and dry, no rash Neuro:  Strength and sensation are intact Psych: euthymic mood, full affect   EKG:  EKG is ordered today. The ekg ordered today demonstrates sinus brady HR  56   Recent Labs: 02/23/2015: ALT 17; BUN 11; Creatinine, Ser 0.95; Hemoglobin 15.1; Platelets 210; Potassium 4.0; Sodium 139    Lipid Panel  Component Value Date/Time   CHOL 141 10/20/2014 1111   TRIG 126.0 10/20/2014 1111   HDL 47.10 10/20/2014 1111   CHOLHDL 3 10/20/2014 1111   VLDL 25.2 10/20/2014 1111   LDLCALC 69 10/20/2014 1111      Wt Readings from Last 3 Encounters:  04/18/15 253 lb 9.6 oz (115.032 kg)  02/23/15 252 lb (114.306 kg)  10/20/14 252 lb 12.8 oz (114.669 kg)      Other studies Reviewed: Additional studies/ records that were reviewed today include: 2D ECHO Review of the above records demonstrates:   2D ECHO: 04/21/2014 LV EF: 55% -  65% Study Conclusions - Left ventricle: The cavity size was normal. Systolic function was normal. The estimated ejection fraction was in the range of 55% to 65%. Doppler parameters are consistent with abnormal left ventricular relaxation (grade 1 diastolic dysfunction). - Aortic valve: AV is thckened, calciifed with restricted motion. Peak and mean gradients through the valve are 51 and 31 mm Hg consiste with moderate AS. There was mild regurgitation. Peak gradient (S): 51 mm Hg. - Left atrium: The atrium was mildly dilated. Impressions: - Since report of October 2015 there is mild increase in gradients across AV.  Nuclear stress test: 10/2013 Overall Impression: Low risk stress nuclear study with a fixed defect in the mid and basal inferior wall consistent with diaphragmatic attenuation. No inducible ischemia.. LV Ejection Fraction: 54%. LV Wall Motion: NL LV Function; NL Wall Motion    ASSESSMENT AND PLAN: SHORTY SCHOENE is a 82 y.o. male with a history of CAD s/p remote PCI/DES of left circ/LAD and prox PDA (2005), HTN, mild dementia, HLD, mild to mod AI and moderate AS who presents today for evaluation of worsening SOB.   Chronic SOB with normal stress test. He has been having ongoing  issues with shortness of breath for some time now ( at least 3 years) which have been worse recently to the point that he can't do his daily activities without significant dyspnea. He is very upset and frustrated that no one can ever help him and all of the tests return normal. He questions if it is in his mind and even saw a psychiatrist. He has had two normal stress tests in 2014 and 2015. A previous BNP was normal. His O2 sats in the office today are 97% at rest and with ambulation. He has seen Dr. Gwenette Greet in the past who felt like it was due to deconditioning and obesity. I am not sure the cause of his shortness of breath but discussed with Dr. Marlou Porch and we feel like we should proceed with a left and right heart cath to exclude ischemia or valvular abnormalities for his shortness of breath.  ASCAD - no anginal CP and nuclear stress test showed no ischemia in 10/2013. Will proceed with [facility] tomorrow as above. - continue ASA/Plavix and statin  HTN - well controlled on Cozaar/Coreg/amlodipine   Dyslipidemia - LDL not at goal on last lipid panel 6/15 which was 89 (goal<70) - ContinueCrestor - he only can tolerate 10mg  daily  Mild AI/Moderate AS - stable - no change from prior echo - repeat scheduled for next week. RHC as above  Chronic LE edema - stable. Continue Lasix but will hold for heart cath tomorrow.   Hypersomnia with severe fatigue - no significant OSA on sleep study    Current medicines are reviewed at length with the patient today.  The patient does not have concerns regarding medicines.  The following changes  have been made:  no change  Labs/ tests ordered today include:    Orders Placed This Encounter  Procedures  . Basic Metabolic Panel (BMET)  . INR/PT  . CBC w/Diff  . EKG 12-Lead     Disposition:   FU with Dr Radford Pax after [facility] Signed, Eileen Stanford, PA-C  04/18/2015 11:32 AM    [facility] [address], [place], [place]   [address] Phone: [phone]; Fax: [phone]

## 2015-04-19 ENCOUNTER — Encounter (HOSPITAL_COMMUNITY)
Admission: RE | Disposition: A | Discharge status: Home / Self Care | Payer: Self-pay | Source: Ambulatory Visit | Attending: Cardiovascular Disease

## 2015-04-19 ENCOUNTER — Ambulatory Visit (HOSPITAL_COMMUNITY)
Admission: RE | Admit: 2015-04-19 | Discharge: 2015-04-19 | Disposition: A | Discharge status: Home / Self Care | Payer: Commercial Managed Care - HMO | Source: Ambulatory Visit | Attending: Cardiovascular Disease | Admitting: Cardiovascular Disease

## 2015-04-19 DIAGNOSIS — Z6832 Body mass index (BMI) 32.0-32.9, adult: Secondary | ICD-10-CM | POA: Diagnosis not present

## 2015-04-19 DIAGNOSIS — K219 Gastro-esophageal reflux disease without esophagitis: Secondary | ICD-10-CM | POA: Diagnosis not present

## 2015-04-19 DIAGNOSIS — I350 Nonrheumatic aortic (valve) stenosis: Secondary | ICD-10-CM | POA: Insufficient documentation

## 2015-04-19 DIAGNOSIS — Z882 Allergy status to sulfonamides status: Secondary | ICD-10-CM | POA: Diagnosis not present

## 2015-04-19 DIAGNOSIS — Z7902 Long term (current) use of antithrombotics/antiplatelets: Secondary | ICD-10-CM | POA: Insufficient documentation

## 2015-04-19 DIAGNOSIS — I2510 Atherosclerotic heart disease of native coronary artery without angina pectoris: Secondary | ICD-10-CM

## 2015-04-19 DIAGNOSIS — F0390 Unspecified dementia without behavioral disturbance: Secondary | ICD-10-CM | POA: Insufficient documentation

## 2015-04-19 DIAGNOSIS — E785 Hyperlipidemia, unspecified: Secondary | ICD-10-CM | POA: Insufficient documentation

## 2015-04-19 DIAGNOSIS — E669 Obesity, unspecified: Secondary | ICD-10-CM | POA: Insufficient documentation

## 2015-04-19 DIAGNOSIS — G4710 Hypersomnia, unspecified: Secondary | ICD-10-CM | POA: Diagnosis not present

## 2015-04-19 DIAGNOSIS — M1990 Unspecified osteoarthritis, unspecified site: Secondary | ICD-10-CM | POA: Diagnosis not present

## 2015-04-19 DIAGNOSIS — Y840 Cardiac catheterization as the cause of abnormal reaction of the patient, or of later complication, without mention of misadventure at the time of the procedure: Secondary | ICD-10-CM | POA: Diagnosis not present

## 2015-04-19 DIAGNOSIS — N400 Enlarged prostate without lower urinary tract symptoms: Secondary | ICD-10-CM | POA: Insufficient documentation

## 2015-04-19 DIAGNOSIS — I77810 Thoracic aortic ectasia: Secondary | ICD-10-CM | POA: Diagnosis not present

## 2015-04-19 DIAGNOSIS — Z955 Presence of coronary angioplasty implant and graft: Secondary | ICD-10-CM | POA: Diagnosis not present

## 2015-04-19 DIAGNOSIS — I97630 Postprocedural hematoma of a circulatory system organ or structure following a cardiac catheterization: Secondary | ICD-10-CM | POA: Insufficient documentation

## 2015-04-19 DIAGNOSIS — F17220 Nicotine dependence, chewing tobacco, uncomplicated: Secondary | ICD-10-CM | POA: Diagnosis not present

## 2015-04-19 DIAGNOSIS — R5383 Other fatigue: Secondary | ICD-10-CM | POA: Diagnosis not present

## 2015-04-19 DIAGNOSIS — I272 Other secondary pulmonary hypertension: Secondary | ICD-10-CM | POA: Diagnosis not present

## 2015-04-19 DIAGNOSIS — J309 Allergic rhinitis, unspecified: Secondary | ICD-10-CM | POA: Insufficient documentation

## 2015-04-19 DIAGNOSIS — Z91040 Latex allergy status: Secondary | ICD-10-CM | POA: Diagnosis not present

## 2015-04-19 DIAGNOSIS — I10 Essential (primary) hypertension: Secondary | ICD-10-CM | POA: Diagnosis not present

## 2015-04-19 DIAGNOSIS — F419 Anxiety disorder, unspecified: Secondary | ICD-10-CM | POA: Insufficient documentation

## 2015-04-19 DIAGNOSIS — Z7982 Long term (current) use of aspirin: Secondary | ICD-10-CM | POA: Insufficient documentation

## 2015-04-19 HISTORY — PX: CARDIAC CATHETERIZATION: SHX172

## 2015-04-19 LAB — PROTIME-INR
INR: 1.06 (ref <1.50)
Prothrombin Time: 13.9 seconds (ref 11.6–15.2)

## 2015-04-19 LAB — POCT I-STAT 3, ART BLOOD GAS (G3+)
Acid-base deficit: 1 mmol/L (ref 0.0–2.0)
Bicarbonate: 23.6 mEq/L (ref 20.0–24.0)
O2 Saturation: 95 %
PCO2 ART: 36.6 mmHg (ref 35.0–45.0)
PH ART: 7.418 (ref 7.350–7.450)
TCO2: 25 mmol/L (ref 0–100)
pO2, Arterial: 76 mmHg — ABNORMAL LOW (ref 80.0–100.0)

## 2015-04-19 LAB — POCT I-STAT 3, VENOUS BLOOD GAS (G3P V)
Bicarbonate: 24.6 mEq/L — ABNORMAL HIGH (ref 20.0–24.0)
O2 SAT: 70 %
TCO2: 26 mmol/L (ref 0–100)
pCO2, Ven: 40.7 mmHg — ABNORMAL LOW (ref 45.0–50.0)
pH, Ven: 7.389 — ABNORMAL HIGH (ref 7.250–7.300)
pO2, Ven: 37 mmHg (ref 31.0–45.0)

## 2015-04-19 SURGERY — RIGHT/LEFT HEART CATH AND CORONARY ANGIOGRAPHY

## 2015-04-19 MED ORDER — SODIUM CHLORIDE 0.9% FLUSH: 3.0000 mL | INTRAVENOUS | Status: DC | PRN

## 2015-04-19 MED ORDER — SODIUM CHLORIDE 0.9 % IV SOLN: 250.0000 mL | INTRAVENOUS | Status: DC | PRN

## 2015-04-19 MED ORDER — SODIUM CHLORIDE 0.9 % WEIGHT BASED INFUSION
3.0000 mL/kg/h | INTRAVENOUS | Status: AC
  Administered 2015-04-19: 3 mL/kg/h via INTRAVENOUS

## 2015-04-19 MED ORDER — MIDAZOLAM HCL 2 MG/2ML IJ SOLN
INTRAMUSCULAR | Status: DC | PRN
  Administered 2015-04-19 (×2): 1 mg via INTRAVENOUS

## 2015-04-19 MED ORDER — LIDOCAINE HCL (PF) 1 % IJ SOLN
INTRAMUSCULAR | Status: DC | PRN
  Administered 2015-04-19: 15 mL

## 2015-04-19 MED ORDER — ASPIRIN 81 MG PO CHEW
CHEWABLE_TABLET | ORAL | Status: AC
  Administered 2015-04-19: 81 mg
  Filled 2015-04-19: qty 1

## 2015-04-19 MED ORDER — IOHEXOL 350 MG/ML SOLN
INTRAVENOUS | Status: DC | PRN
  Administered 2015-04-19: 80 mL via INTRACARDIAC

## 2015-04-19 MED ORDER — HEPARIN (PORCINE) IN NACL 2-0.9 UNIT/ML-% IJ SOLN
INTRAMUSCULAR | Status: DC | PRN
  Administered 2015-04-19: 1500 mL via INTRA_ARTERIAL

## 2015-04-19 MED ORDER — ONDANSETRON HCL 4 MG/2ML IJ SOLN: 4.0000 mg | Freq: Four times a day (QID) | INTRAMUSCULAR | Status: DC | PRN

## 2015-04-19 MED ORDER — LIDOCAINE HCL (PF) 1 % IJ SOLN
INTRAMUSCULAR | Status: AC
  Filled 2015-04-19: qty 30

## 2015-04-19 MED ORDER — MIDAZOLAM HCL 2 MG/2ML IJ SOLN
INTRAMUSCULAR | Status: AC
Start: 2015-04-19 — End: 2015-04-19
  Filled 2015-04-19: qty 2

## 2015-04-19 MED ORDER — ASPIRIN 81 MG PO CHEW: 81.0000 mg | CHEWABLE_TABLET | Freq: Every day | ORAL | Status: DC

## 2015-04-19 MED ORDER — SODIUM CHLORIDE 0.9 % IV SOLN: INTRAVENOUS | Status: DC

## 2015-04-19 MED ORDER — HEPARIN (PORCINE) IN NACL 2-0.9 UNIT/ML-% IJ SOLN
INTRAMUSCULAR | Status: AC
  Filled 2015-04-19: qty 1500

## 2015-04-19 MED ORDER — SODIUM CHLORIDE 0.9 % WEIGHT BASED INFUSION: 1.0000 mL/kg/h | INTRAVENOUS | Status: DC

## 2015-04-19 MED ORDER — SODIUM CHLORIDE 0.9% FLUSH: 3.0000 mL | Freq: Two times a day (BID) | INTRAVENOUS | Status: DC

## 2015-04-19 MED ORDER — FENTANYL CITRATE (PF) 100 MCG/2ML IJ SOLN
INTRAMUSCULAR | Status: DC | PRN
  Administered 2015-04-19 (×2): 25 ug via INTRAVENOUS

## 2015-04-19 MED ORDER — ACETAMINOPHEN 325 MG PO TABS: 650.0000 mg | ORAL_TABLET | ORAL | Status: DC | PRN

## 2015-04-19 MED ORDER — FENTANYL CITRATE (PF) 100 MCG/2ML IJ SOLN
INTRAMUSCULAR | Status: AC
  Filled 2015-04-19: qty 2

## 2015-04-19 MED ORDER — ASPIRIN 81 MG PO CHEW: 81.0000 mg | CHEWABLE_TABLET | ORAL | Status: DC

## 2015-04-19 SURGICAL SUPPLY — 14 items
CATH INFINITI 5FR JL5 (CATHETERS) ×2 IMPLANT
CATH INFINITI 5FR MULTPACK ANG (CATHETERS) ×2 IMPLANT
CATH SWAN GANZ 7F STRAIGHT (CATHETERS) ×2 IMPLANT
GUIDEWIRE .025 260CM (WIRE) ×2 IMPLANT
HOVERMATT SINGLE USE (MISCELLANEOUS) ×2 IMPLANT
KIT HEART LEFT (KITS) ×3 IMPLANT
KIT HEART RIGHT NAMIC (KITS) ×3 IMPLANT
PACK CARDIAC CATHETERIZATION (CUSTOM PROCEDURE TRAY) ×3 IMPLANT
SHEATH PINNACLE 6F 10CM (SHEATH) ×2 IMPLANT
SHEATH PINNACLE 7F 10CM (SHEATH) ×2 IMPLANT
SYR MEDRAD MARK V 150ML (SYRINGE) ×3 IMPLANT
TRANSDUCER W/STOPCOCK (MISCELLANEOUS) ×6 IMPLANT
WIRE EMERALD 3MM-J .035X150CM (WIRE) ×2 IMPLANT
WIRE EMERALD ST .035X150CM (WIRE) ×2 IMPLANT

## 2015-04-19 NOTE — Discharge Instructions (Signed)
Angiogram, Care After °Refer to this sheet in the next few weeks. These instructions provide you with information about caring for yourself after your procedure. Your health care provider may also give you more specific instructions. Your treatment has been planned according to current medical practices, but problems sometimes occur. Call your health care provider if you have any problems or questions after your procedure. °WHAT TO EXPECT AFTER THE PROCEDURE °After your procedure, it is typical to have the following: °· Bruising at the catheter insertion site that usually fades within 1-2 weeks. °· Blood collecting in the tissue (hematoma) that may be painful to the touch. It should usually decrease in size and tenderness within 1-2 weeks. °HOME CARE INSTRUCTIONS °· Take medicines only as directed by your health care provider. °· You may shower 24-48 hours after the procedure or as directed by your health care provider. Remove the bandage (dressing) and gently wash the site with plain soap and water. Pat the area dry with a clean towel. Do not rub the site, because this may cause bleeding. °· Do not take baths, swim, or use a hot tub until your health care provider approves. °· Check your insertion site every day for redness, swelling, or drainage. °· Do not apply powder or lotion to the site. °· Do not lift over 10 lb (4.5 kg) for 5 days after your procedure or as directed by your health care provider. °· Ask your health care provider when it is okay to: °¨ Return to work or school. °¨ Resume usual physical activities or sports. °¨ Resume sexual activity. °· Do not drive home if you are discharged the same day as the procedure. Have someone else drive you. °· You may drive 24 hours after the procedure unless otherwise instructed by your health care provider. °· Do not operate machinery or power tools for 24 hours after the procedure or as directed by your health care provider. °· If your procedure was done as an  outpatient procedure, which means that you went home the same day as your procedure, a responsible adult should be with you for the first 24 hours after you arrive home. °· Keep all follow-up visits as directed by your health care provider. This is important. °SEEK MEDICAL CARE IF: °· You have a fever. °· You have chills. °· You have increased bleeding from the catheter insertion site. Hold pressure on the site.  CALL 911 °SEEK IMMEDIATE MEDICAL CARE IF: °· You have unusual pain at the catheter insertion site. °· You have redness, warmth, or swelling at the catheter insertion site. °· You have drainage (other than a small amount of blood on the dressing) from the catheter insertion site. °· The catheter insertion site is bleeding, and the bleeding does not stop after 30 minutes of holding steady pressure on the site. °· The area near or just beyond the catheter insertion site becomes pale, cool, tingly, or numb. °  °This information is not intended to replace advice given to you by your health care provider. Make sure you discuss any questions you have with your health care provider. °  °Document Released: 08/15/2004 Document Revised: 02/17/2014 Document Reviewed: 06/30/2012 °Elsevier Interactive Patient Education ©2016 [employer] ° °

## 2015-04-19 NOTE — H&P (View-Only) (Signed)
Cardiology Office Note   Date:  04/18/2015   ID:  ROGERIO ANGLEY, DOB Mar 12, 1933, MRN [id number]  PCP:  [name], MD  Cardiologist:  Dr. Radford Pax   CC: worsening SOB    History of Present Illness: [name] is a 82 y.o. male with a history of CAD s/p remote PCI/DES of left circ/LAD and prox PDA (2005), HTN, mild dementia, HLD, mild to mod AI and moderate AS who presents today for evaluation of worsening SOB.  He was seen by Dr. Radford Pax in 09/2014 for cardiology follow up. He has chronic fatigue and SOB and this was felt to be stable. He had a stress test for evaluation of SOB in 10/2013 which was normal. BNP also ordered which was normal. He has also seen Dr. Gwenette Greet in the past who felt his SOB was from obesity. Sleep study negative for OSA.  He called our office on 04/16/15 complaining, that "I cant even walk to the bathroom without having to sit down to catch my breath" and wanted to be seen sooner than next week.   He was added onto my schedule for evaluation. This has been going on for over two years but it seems like this is getting worse. Every time he takes a bath or uses the bathroom he is so short of breath he has to sit down. It is made worse with bending over and standing back up. He has had no chest pain. No dizziness or syncope. No LE edema, orthopnea or PND.     Past Medical History  Diagnosis Date  . Hypertension   . Heart murmur   . BPH (benign prostatic hypertrophy)   . GERD (gastroesophageal reflux disease)   . Anxiety   . Headache(784.0)     relieved with OTC's  . Dementia     early per pt- recently evaluated at [facility]- eccho7/13  , MRI head EPIC 6/13  . Bruises easily   . Unsteady gait   . Hyperlipidemia   . HBP (high blood pressure)   . Allergic rhinitis   . AI (aortic insufficiency)     Mild to Mod, Moderate AS Echo 02/2011  . Chronic fatigue   . Left carotid bruit 03/2008    w minimal obstruction on doppler  . OAB (overactive bladder)    . Dementia, vascular     Dr [name]  . Coronary artery disease     s/p PCI of left cir and LAD 2005 and PCI of prox PDA 7/09  . Shortness of breath dyspnea     with ADLs  . Arthritis     Past Surgical History  Procedure Laterality Date  . Transurethral resection of prostate    . Turp vaporization    . Cardiac catheterization      3 stents first cath 2 stents placed then additional stent placed  . Hemorroidectomy    . Rotator cuff repair  3/13    right  . Tonsillectomy    . Knee arthroscopy  09/19/2011    Procedure: ARTHROSCOPY KNEE;  Surgeon: [name], MD;  Location: [facility];  Service: Orthopedics;  Laterality: Left;  . Colonscopy     . Eye surgery      cataract surgery bilat   . Total knee arthroplasty Left 02/21/2014    Procedure: LEFT TOTAL KNEE ARTHROPLASTY;  Surgeon: [name], MD;  Location: [facility];  Service: Orthopedics;  Laterality: Left;  . Stent placed      x3  per pt     Current Outpatient Prescriptions  Medication Sig Dispense Refill  . acetaminophen (TYLENOL) 325 MG tablet Take 650 mg by mouth every 6 (six) hours as needed for mild pain.    [name] Kitchen amLODipine (NORVASC) 5 MG tablet TAKE 1 TABLET (5 MG TOTAL) BY MOUTH DAILY. 30 tablet 5  . aspirin 81 MG tablet Take 81 mg by mouth daily.    . carvedilol (COREG) 12.5 MG tablet TAKE 1 TABLET TWICE DAILY WITH MEALS 180 tablet 0  . clopidogrel (PLAVIX) 75 MG tablet Take 1 tablet (75 mg total) by mouth daily. 90 tablet 1  . donepezil (ARICEPT) 10 MG tablet Take 10 mg by mouth at bedtime.    [name] Kitchen doxycycline (VIBRAMYCIN) 100 MG capsule Take 1 capsule (100 mg total) by mouth 2 (two) times daily. 20 capsule 0  . finasteride (PROSCAR) 5 MG tablet Take 5 mg by mouth daily.    . furosemide (LASIX) 20 MG tablet TAKE 1 TABLET EVERY DAY AFTER BREAKFAST 90 tablet 0  . losartan (COZAAR) 50 MG tablet Take 50 mg by mouth 2 (two) times daily.    . Multiple Vitamin (MULTIVITAMIN WITH MINERALS) TABS Take 1 tablet by mouth daily.    .  nitroGLYCERIN (NITROSTAT) 0.4 MG SL tablet Place 1 tablet (0.4 mg total) under the tongue every 5 (five) minutes as needed. For chest pain 25 tablet 3  . omeprazole (PRILOSEC) 40 MG capsule Take 1 capsule (40 mg total) by mouth daily. 90 capsule 2  . potassium chloride SA (K-DUR,KLOR-CON) 20 MEQ tablet Take 1 tablet (20 mEq total) by mouth daily. 90 tablet 2  . rosuvastatin (CRESTOR) 20 MG tablet Take 0.5 tablets (10 mg total) by mouth daily. 45 tablet 2  . sertraline (ZOLOFT) 50 MG tablet Take 50 mg by mouth every morning.      No current facility-administered medications for this visit.    Allergies:   Latex and Sulfa antibiotics    Social History:  The patient  reports that he quit smoking about 45 years ago. His smoking use included Cigars. He has quit using smokeless tobacco. His smokeless tobacco use included Chew. He reports that he drinks about 1.8 oz of alcohol per week. He reports that he does not use illicit drugs.   Family History:  The patient's family history includes Lung cancer in his father. There is no history of Anesthesia problems or Heart attack.    ROS:  Please see the history of present illness.   Otherwise, review of systems are positive for NONE.   All other systems are reviewed and negative.    PHYSICAL EXAM: VS:  BP 142/78 mmHg  Pulse 56  Ht 6\' 2"  (1.88 m)  Wt 253 lb 9.6 oz (115.032 kg)  BMI 32.55 kg/m2  SpO2 98% , BMI Body mass index is 32.55 kg/(m^2). GEN: Well nourished, well developed, in no acute distressobese HEENT: normal Neck: no JVD, carotid bruits, or masses Cardiac: brady; no murmurs, rubs, or gallops,no edema  Respiratory:  clear to auscultation bilaterally, normal work of breathing GI: soft, nontender, nondistended, + BS MS: no deformity or atrophy Skin: warm and dry, no rash Neuro:  Strength and sensation are intact Psych: euthymic mood, full affect   EKG:  EKG is ordered today. The ekg ordered today demonstrates sinus brady HR  56   Recent Labs: 02/23/2015: ALT 17; BUN 11; Creatinine, Ser 0.95; Hemoglobin 15.1; Platelets 210; Potassium 4.0; Sodium 139    Lipid Panel  Component Value Date/Time   CHOL 141 10/20/2014 1111   TRIG 126.0 10/20/2014 1111   HDL 47.10 10/20/2014 1111   CHOLHDL 3 10/20/2014 1111   VLDL 25.2 10/20/2014 1111   LDLCALC 69 10/20/2014 1111      Wt Readings from Last 3 Encounters:  04/18/15 253 lb 9.6 oz (115.032 kg)  02/23/15 252 lb (114.306 kg)  10/20/14 252 lb 12.8 oz (114.669 kg)      Other studies Reviewed: Additional studies/ records that were reviewed today include: 2D ECHO Review of the above records demonstrates:   2D ECHO: 04/21/2014 LV EF: 55% -  65% Study Conclusions - Left ventricle: The cavity size was normal. Systolic function was normal. The estimated ejection fraction was in the range of 55% to 65%. Doppler parameters are consistent with abnormal left ventricular relaxation (grade 1 diastolic dysfunction). - Aortic valve: AV is thckened, calciifed with restricted motion. Peak and mean gradients through the valve are 51 and 31 mm Hg consiste with moderate AS. There was mild regurgitation. Peak gradient (S): 51 mm Hg. - Left atrium: The atrium was mildly dilated. Impressions: - Since report of October 2015 there is mild increase in gradients across AV.  Nuclear stress test: 10/2013 Overall Impression: Low risk stress nuclear study with a fixed defect in the mid and basal inferior wall consistent with diaphragmatic attenuation. No inducible ischemia.. LV Ejection Fraction: 54%. LV Wall Motion: NL LV Function; NL Wall Motion    ASSESSMENT AND PLAN: [name] is a 82 y.o. male with a history of CAD s/p remote PCI/DES of left circ/LAD and prox PDA (2005), HTN, mild dementia, HLD, mild to mod AI and moderate AS who presents today for evaluation of worsening SOB.   Chronic SOB with normal stress test. He has been having ongoing  issues with shortness of breath for some time now ( at least 3 years) which have been worse recently to the point that he can't do his daily activities without significant dyspnea. He is very upset and frustrated that no one can ever help him and all of the tests return normal. He questions if it is in his mind and even saw a psychiatrist. He has had two normal stress tests in 2014 and 2015. A previous BNP was normal. His O2 sats in the office today are 97% at rest and with ambulation. He has seen Dr. Gwenette Greet in the past who felt like it was due to deconditioning and obesity. I am not sure the cause of his shortness of breath but discussed with Dr. Marlou Porch and we feel like we should proceed with a left and right heart cath to exclude ischemia or valvular abnormalities for his shortness of breath.  ASCAD - no anginal CP and nuclear stress test showed no ischemia in 10/2013. Will proceed with [facility] tomorrow as above. - continue ASA/Plavix and statin  HTN - well controlled on Cozaar/Coreg/amlodipine   Dyslipidemia - LDL not at goal on last lipid panel 6/15 which was 89 (goal<70) - ContinueCrestor - he only can tolerate 10mg  daily  Mild AI/Moderate AS - stable - no change from prior echo - repeat scheduled for next week. RHC as above  Chronic LE edema - stable. Continue Lasix but will hold for heart cath tomorrow.   Hypersomnia with severe fatigue - no significant OSA on sleep study    Current medicines are reviewed at length with the patient today.  The patient does not have concerns regarding medicines.  The following changes  have been made:  no change  Labs/ tests ordered today include:    Orders Placed This Encounter  Procedures  . Basic Metabolic Panel (BMET)  . INR/PT  . CBC w/Diff  . EKG 12-Lead     Disposition:   FU with Dr Radford Pax after [facility] Signed, Eileen Stanford, PA-C  04/18/2015 11:32 AM    [address], [place], [place]   [address] Phone: [phone]; Fax: [phone]

## 2015-04-19 NOTE — Interval H&P Note (Signed)
History and Physical Interval Note:  04/19/2015 Cath Lab Visit (complete for each Cath Lab visit)  Clinical Evaluation Leading to the Procedure:   ACS: No.  Non-ACS:    Anginal Classification: CCS IV  Anti-ischemic medical therapy: Maximal Therapy (2 or more classes of medications)  Non-Invasive Test Results: No non-invasive testing performed  Prior CABG: No previous CABG       10:32 AM  [name]  has presented today for surgery, with the diagnosis of shortness of breath  The various methods of treatment have been discussed with the patient and family. After consideration of risks, benefits and other options for treatment, the patient has consented to  Procedure(s): Right/Left Heart Cath and Coronary Angiography (N/A) as a surgical intervention .  The patient's history has been reviewed, patient examined, no change in status, stable for surgery.  I have reviewed the patient's chart and labs.  Questions were answered to the patient's satisfaction.     [name] A

## 2015-04-19 NOTE — Progress Notes (Signed)
Site area: RT GROIN Site Prior to Removal:  Level 0 Pressure Applied For:40 MINUTES Manual: YES   Patient Status During Pull:  AWAKE Post Pull Site:  Level 0 Post Pull Instructions Given: YES  Post Pull Pulses Present: RT DP DOPPLER Dressing Applied:  YES Bedrest begins @ 12:50 Comments:

## 2015-04-19 NOTE — Progress Notes (Signed)
Pt has level 1 right groin pressure held/ Dr Claiborne Billings was called and he does not want bp meds given but wants a 30 minute hold. No further orders at this time.

## 2015-04-19 NOTE — Progress Notes (Signed)
Pt coughed on food, right groin slight hematoma, pressure held 20 minutes. Site was reduced.

## 2015-04-20 ENCOUNTER — Encounter (HOSPITAL_COMMUNITY): Payer: Self-pay | Admitting: Cardiovascular Disease

## 2015-04-20 ENCOUNTER — Telehealth: Payer: Self-pay | Admitting: Cardiology

## 2015-04-20 DIAGNOSIS — R0602 Shortness of breath: Secondary | ICD-10-CM

## 2015-04-20 DIAGNOSIS — I350 Nonrheumatic aortic (valve) stenosis: Secondary | ICD-10-CM

## 2015-04-20 NOTE — Telephone Encounter (Signed)
New message      Pt has a cath yesterday.  He was told his valve will need to be fixed.  Please call and let them know what is the next step?

## 2015-04-20 NOTE — Telephone Encounter (Signed)
Referral for Dr. Roxy Manns placed.  OV with Dr. Radford Pax cancelled. OV scheduled to see K. Grandville Silos 3/30 per patient request. Patient's wife agrees with treatment plan.

## 2015-04-20 NOTE — Telephone Encounter (Signed)
Patient's wife called extremely upset they have not been called with a plan for her husband's valve replacement.  Informed her that no orders have been received yet, but will call her as soon as recommendations are received.  She is adamant about seeing a doctor or receiving instructions TODAY.  She refuses to wait until Tuesday when patient has appointment with Dr. Radford Pax.  To Dr. Radford Pax for recommendations.

## 2015-04-20 NOTE — Telephone Encounter (Signed)
Please refer to Dr. Roxy Manns with [facility] ASAP since patient has severe AS.  OK to cancel appt with me and he can see extender in 2 weeks.

## 2015-04-24 ENCOUNTER — Ambulatory Visit: Payer: Commercial Managed Care - HMO | Admitting: Cardiology

## 2015-04-24 NOTE — Telephone Encounter (Signed)
Patient is scheduled to see Dr. Roxy Manns 3/20.

## 2015-04-30 ENCOUNTER — Encounter: Payer: Self-pay | Admitting: Thoracic Surgery (Cardiothoracic Vascular Surgery)

## 2015-04-30 ENCOUNTER — Institutional Professional Consult (permissible substitution) (INDEPENDENT_AMBULATORY_CARE_PROVIDER_SITE_OTHER): Payer: Commercial Managed Care - HMO | Admitting: Thoracic Surgery (Cardiothoracic Vascular Surgery)

## 2015-04-30 ENCOUNTER — Other Ambulatory Visit: Payer: Self-pay | Admitting: *Deleted

## 2015-04-30 VITALS — BP 141/84 | HR 81 | Resp 18 | Ht 74.0 in | Wt 253.0 lb

## 2015-04-30 DIAGNOSIS — I350 Nonrheumatic aortic (valve) stenosis: Secondary | ICD-10-CM | POA: Diagnosis not present

## 2015-04-30 DIAGNOSIS — I5032 Chronic diastolic (congestive) heart failure: Secondary | ICD-10-CM | POA: Diagnosis not present

## 2015-04-30 NOTE — Patient Instructions (Signed)
Continue all previous medications without any changes at this time  

## 2015-04-30 NOTE — Progress Notes (Signed)
[facility]  MULTIDISCIPLINARY HEART VALVE CLINIC  CARDIOTHORACIC SURGERY CONSULTATION REPORT  Referring Provider is Sueanne Margarita, MD PCP is [name], MD  Chief Complaint  Patient presents with  . Aortic Stenosis    eval for surgery.[name] KitchenCATH 04/19/15, ECHO, 04/21/14, PFT 05/10/14    HPI:  Patient is an 82 year old male with history of aortic stenosis, coronary artery disease status post PCI and stenting in the distant past, hypertension, GE reflux disease, and mild dementia who has been referred for surgical consultation to discuss treatment options for management of severe symptomatic aortic stenosis. The patient's cardiac history dates back more than 10 years ago when he first presented with symptoms of exertional shortness of breath. He was found to have multivessel coronary artery disease and treated with multivessel PCI and stenting including treatment of the left anterior ascending coronary artery, the left circumflex coronary artery and the posterior descending coronary artery.  He initially did well. He was noted to have a murmur on physical exam and echocardiograms revealed the presence of mild to moderate aortic stenosis with mild-to-moderate aortic insufficiency. The patient has been followed regularly by Dr. Radford Pax for the last several years. He has complained of chronic exertional shortness of breath that has progressed. He underwent a nuclear stress test in September 2015 that was felt to be low risk for myocardial ischemia.  Transthoracic echocardiograms have demonstrated gradual progression of the severity of aortic stenosis. Echocardiogram performed 04/21/2014 revealed preserved left ventricular systolic function with ejection fraction estimated 55-65%. At that time the peak velocity across the aortic valve was measured 3.6 m/s corresponding to peak and mean transvalvular gradients estimated 51 and 32 mmHg, respectively. The dimensionless velocity ratio was reported  0.25.  The patient's symptoms of exertional shortness of breath continued to progress. He was subsequently referred for pulmonary evaluation and seen in consultation by Dr. Gwenette Greet on 05/22/2014.  Pulmonary function tests were performed that did not reveal any significant airflow obstruction although he did have slight improvement with bronchodilator therapy.  He was advised to work on weight loss and physical conditioning. The patient's symptoms of exertional shortness of breath have continued to progress.  He was seen in follow-up recently by Angelena Form who recommended follow-up diagnostic cardiac catheterization.  Catheterization before 04/19/2015 by Dr. Claiborne Billings revealed moderate nonobstructive multivessel coronary artery disease with continued patency in all of his stents. He was found to have severe aortic stenosis with peak to peak and mean transvalvular gradients measured 56 and 49 mmHg by catheterization. The aortic valve area was calculated 0.69 cm.  Pulmonary artery pressures were normal. The patient was referred for surgical consultation.  The patient is married and lives locally in [place] with his wife.  He has been retired for approximately 13 years, having previously worked for [employer]. He has lived a progressively sedentary lifestyle. He has been limited primarily by severe exertional shortness of breath over the past 2 years. He states that he gets short of breath with minimal physical activity, even just getting up to go to the bathroom at night. He denies any history of resting shortness of breath, PND, orthopnea, or chest pain.  He has had some mild lower extremity edema in the past which has resolved with diuretic therapy. He has had occasional dizzy spells without syncope. His mobility is also limited because of chronic pain due to arthritis afflicting both knees. He walks using a cane. He has had several mechanical falls but he denies any history of syncope. He  also  admits to mild short-term memory disturbance. This has not been severe and has not been problematic from a practical standpoint, but it was prominent enough to warrant formal medical evaluation by a neurologist. The patient has been taking Aricept for the past 2 years and according to the patient and his wife he has been quite stable. He still drives an automobile although infrequently.  His primary complaint is that of severe exertional shortness of breath and progressive fatigue.  Past Medical History  Diagnosis Date  . Hypertension   . Heart murmur   . BPH (benign prostatic hypertrophy)   . GERD (gastroesophageal reflux disease)   . Anxiety   . Headache(784.0)     relieved with OTC's  . Dementia     early per pt- recently evaluated at [facility]- eccho7/13  , MRI head EPIC 6/13  . Bruises easily   . Unsteady gait   . Hyperlipidemia   . HBP (high blood pressure)   . Allergic rhinitis   . Chronic fatigue   . Left carotid bruit     w minimal obstruction on doppler  . OAB (overactive bladder)   . Dementia, vascular     Dr [name]  . Coronary artery disease     s/p PCI of left cir and LAD 2005 and PCI of prox PDA 7/09  . Shortness of breath dyspnea     with ADLs  . Arthritis   . Osteoarthritis of left knee   . Coronary atherosclerosis of native coronary artery   . Pure hypercholesterolemia   . Essential hypertension, benign   . Aortic insufficiency   . Aortic stenosis, severe   . Chronic diastolic congestive heart failure [facility])     Past Surgical History  Procedure Laterality Date  . Transurethral resection of prostate    . Turp vaporization    . Cardiac catheterization      3 stents first cath 2 stents placed then additional stent placed  . Hemorroidectomy    . Rotator cuff repair  3/13    right  . Tonsillectomy    . Knee arthroscopy  09/19/2011    Procedure: ARTHROSCOPY KNEE;  Surgeon: [name], MD;  Location: [facility];  Service: Orthopedics;  Laterality: Left;  .  Colonscopy     . Eye surgery      cataract surgery bilat   . Total knee arthroplasty Left 02/21/2014    Procedure: LEFT TOTAL KNEE ARTHROPLASTY;  Surgeon: [name], MD;  Location: [facility];  Service: Orthopedics;  Laterality: Left;  . Stent placed      x3 per pt  . Cardiac catheterization N/A 04/19/2015    Procedure: Right/Left Heart Cath and Coronary Angiography;  Surgeon: [name], MD;  Location: [facility];  Service: Cardiovascular;  Laterality: N/A;    Family History  Problem Relation Age of Onset  . Anesthesia problems Neg Hx   . Lung cancer Father   . Heart attack Neg Hx     Social History   Social History  . Marital Status: Married    Spouse Name: N/A  . Number of Children: 3  . Years of Education: N/A   Occupational History  . retired    Social History Main Topics  . Smoking status: Former Smoker -- 20 years    Types: Cigars    Quit date: 02/10/1970  . Smokeless tobacco: Former Systems developer    Types: Chew     Comment: smoked cigars off  and on x 20 years  . Alcohol Use: 1.8 oz/week    3 Glasses of wine per week     Comment: weekly/    occ beer/wine  . Drug Use: No  . Sexual Activity: Not Currently   Other Topics Concern  . Not on file   Social History Narrative    Current Outpatient Prescriptions  Medication Sig Dispense Refill  . acetaminophen (TYLENOL) 325 MG tablet Take 650 mg by mouth every 6 (six) hours as needed for mild pain.    [name] Kitchen amLODipine (NORVASC) 5 MG tablet TAKE 1 TABLET (5 MG TOTAL) BY MOUTH DAILY. 30 tablet 5  . aspirin 81 MG tablet Take 81 mg by mouth daily.    . carvedilol (COREG) 12.5 MG tablet TAKE 1 TABLET TWICE DAILY WITH MEALS 180 tablet 0  . clopidogrel (PLAVIX) 75 MG tablet Take 1 tablet (75 mg total) by mouth daily. 90 tablet 1  . finasteride (PROSCAR) 5 MG tablet Take 5 mg by mouth daily.    . furosemide (LASIX) 20 MG tablet TAKE 1 TABLET EVERY DAY AFTER BREAKFAST 90 tablet 0  . losartan (COZAAR) 50 MG tablet Take 50  mg by mouth 2 (two) times daily.    . Multiple Vitamin (MULTIVITAMIN WITH MINERALS) TABS Take 1 tablet by mouth daily.    . nitroGLYCERIN (NITROSTAT) 0.4 MG SL tablet Place 1 tablet (0.4 mg total) under the tongue every 5 (five) minutes as needed. For chest pain 25 tablet 3  . omeprazole (PRILOSEC) 40 MG capsule Take 1 capsule (40 mg total) by mouth daily. 90 capsule 2  . potassium chloride SA (K-DUR,KLOR-CON) 20 MEQ tablet Take 1 tablet (20 mEq total) by mouth daily. 90 tablet 2  . rosuvastatin (CRESTOR) 20 MG tablet Take 0.5 tablets (10 mg total) by mouth daily. 45 tablet 2  . sertraline (ZOLOFT) 50 MG tablet Take 50 mg by mouth every morning.     . donepezil (ARICEPT) 10 MG tablet Take 10 mg by mouth at bedtime.     No current facility-administered medications for this visit.    Allergies  Allergen Reactions  . Latex Rash  . Sulfa Antibiotics Hives and Rash      Review of Systems:   General:  normal appetite, decreased energy, no weight gain, no weight loss, no fever  Cardiac:  no chest pain with exertion, no chest pain at rest, + SOB with minimal exertion, no resting SOB, no PND, no orthopnea, no palpitations, no arrhythmia, no atrial fibrillation, + LE edema, + dizzy spells, no syncope  Respiratory:  + shortness of breath, no home oxygen, no productive cough, + chronic dry cough, no bronchitis, no wheezing, no hemoptysis, no asthma, no pain with inspiration or cough, no sleep apnea, no CPAP at night  GI:   occasional difficulty swallowing, + reflux, no frequent heartburn, no hiatal hernia, no abdominal pain, no constipation, no diarrhea, no hematochezia, no hematemesis, no melena  GU:   no dysuria,  + frequency, no urinary tract infection, no hematuria, + enlarged prostate, no kidney stones, no kidney disease  Vascular:  + pain suggestive of claudication, + pain in feet, + leg cramps, no varicose veins, no DVT, no non-healing foot ulcer  Neuro:   no stroke, no TIA's, no seizures, no  headaches, no temporary blindness one eye,  no slurred speech, no peripheral neuropathy, + mild chronic pain, + mild instability of gait, + mild memory/cognitive dysfunction  Musculoskeletal: + arthritis, no joint swelling, no myalgias, +  some difficulty walking, decreased mobility   Skin:   no rash, no itching, no skin infections, no pressure sores or ulcerations  Psych:   no anxiety, no depression, no nervousness, no unusual recent stress  Eyes:   no blurry vision, no floaters, no recent vision changes, does not wears glasses or contacts  ENT:   no hearing loss, no loose or painful teeth, no dentures, last saw dentist several years ago  Hematologic:  + easy bruising, + abnormal bleeding, no clotting disorder, no frequent epistaxis  Endocrine:  no diabetes, does not check CBG's at home           Physical Exam:   BP 141/84 mmHg  Pulse 81  Resp 18  Ht 6\' 2"  (1.88 m)  Wt 253 lb (114.76 kg)  BMI 32.47 kg/m2  SpO2 97%  General:  Moderately obese,  well-appearing, somewhat deconditioned  HEENT:  Unremarkable   Neck:   no JVD, no bruits, no adenopathy   Chest:   clear to auscultation, symmetrical breath sounds, no wheezes, no rhonchi   CV:   RRR, grade III/VI crescendo/decrescendo murmur heard best at RSB,  no diastolic murmur  Abdomen:  soft, non-tender, no masses   Extremities:  warm, well-perfused, pulses palpable both groins, not palpable at ankles, no LE edema  Rectal/GU  Deferred  Neuro:   Grossly non-focal and symmetrical throughout  Skin:   Clean and dry, no rashes, no breakdown   Diagnostic Tests:  Transthoracic Echocardiography  Patient:  Junaid, Suguitan MR #:    [id number] Study Date: 04/21/2014 Gender:   M Age:    33 Height:   188 cm Weight:   111.6 kg BSA:    2.44 m^2 Pt. Status: Room:  ORDERING   Fransico Him, MD REFERRING  Fransico Him, MD SONOGRAPHER Victorio Palm, RDCS ATTENDING  Dorris Carnes, M.D. PERFORMING  [facility],  Outpatient  cc:  ------------------------------------------------------------------- LV EF: 55% -  65%  ------------------------------------------------------------------- Indications:   Aortic stenosis (I35.9).  ------------------------------------------------------------------- History:  PMH: Acquired from the patient and from the patient&'s chart. Coronary artery disease. Coronary artery disease. Moderate aortic stenosis. Moderate aortic regurgitation. Risk factors: Hypertension. Obese. Dyslipidemia.  ------------------------------------------------------------------- Study Conclusions  - Left ventricle: The cavity size was normal. Systolic function was normal. The estimated ejection fraction was in the range of 55% to 65%. Doppler parameters are consistent with abnormal left ventricular relaxation (grade 1 diastolic dysfunction). - Aortic valve: AV is thckened, calciifed with restricted motion. Peak and mean gradients through the valve are 51 and 31 mm Hg consiste with moderate AS. There was mild regurgitation. Peak gradient (S): 51 mm Hg. - Left atrium: The atrium was mildly dilated.  Impressions:  - Since report of October 2015 there is mild increase in gradients across AV.  ------------------------------------------------------------------- Labs, prior tests, procedures, and surgery: Echocardiography (October 2015).  The aortic valve showed moderate stenosis. EF was 60% and PA pressure was 31 (systolic). Aortic valve: peak gradient of 48 mm Hg and mean gradient of 28 mm Hg.  Catheterization.  The study demonstrated coronary artery disease. There was a stenosis in the left circumflex coronary artery which was treated with a percutaneous intervention. There was a stenosis in the left anterior descending coronary artery which was treated with a percutaneous intervention. There was a stenosis in the right posterior descending coronary  artery which was treated with a percutaneous intervention. Transthoracic echocardiography. M-mode, complete 2D, spectral Doppler, and color Doppler. Birthdate: Patient birthdate: March 29, 1933. Age: Patient is 82  yr old. Sex: Gender: male. BMI: 31.6 kg/m^2. Blood pressure:   144/72 Patient status: Outpatient. Study date: Study date: 04/21/2014. Study time: 09:55 AM. Location: [facility]  -------------------------------------------------------------------  ------------------------------------------------------------------- Left ventricle: The cavity size was normal. Systolic function was normal. The estimated ejection fraction was in the range of 55% to 65%. Doppler parameters are consistent with abnormal left ventricular relaxation (grade 1 diastolic dysfunction).  ------------------------------------------------------------------- Aortic valve: AV is thckened, calciifed with restricted motion. Peak and mean gradients through the valve are 51 and 31 mm Hg consiste with moderate AS. Doppler: There was mild regurgitation.  VTI ratio of LVOT to aortic valve: 0.28. Valve area (VTI): 1.27 cm^2. Indexed valve area (VTI): 0.52 cm^2/m^2. Mean velocity ratio of LVOT to aortic valve: 0.25. Valve area (Vmean): 1.12 cm^2. Indexed valve area (Vmean): 0.46 cm^2/m^2.  Mean gradient (S): 32 mm Hg. Peak gradient (S): 51 mm Hg.  ------------------------------------------------------------------- Aorta: Mild dilation of proximal ascending aorta (42 mm)  ------------------------------------------------------------------- Mitral valve:  Structurally normal valve.  Leaflet separation was normal. Doppler: Transvalvular velocity was within the normal range. There was no evidence for stenosis. There was no regurgitation.  ------------------------------------------------------------------- Left atrium: The atrium was mildly  dilated.  ------------------------------------------------------------------- Right ventricle: The cavity size was normal. Wall thickness was normal. Systolic function was normal.  ------------------------------------------------------------------- Pulmonic valve:  Structurally normal valve.  Cusp separation was normal. Doppler: Transvalvular velocity was within the normal range. There was no regurgitation.  ------------------------------------------------------------------- Tricuspid valve:  Structurally normal valve.  Leaflet separation was normal. Doppler: Transvalvular velocity was within the normal range. There was mild regurgitation.  ------------------------------------------------------------------- Right atrium: The atrium was normal in size.  ------------------------------------------------------------------- Pericardium: There was no pericardial effusion.  ------------------------------------------------------------------- Post procedure conclusions Ascending Aorta:  - Mild dilation of proximal ascending aorta (42 mm)  ------------------------------------------------------------------- Measurements  Left ventricle              Value     Reference LV ID, ED, PLAX chordal      (L)   34  mm    43 - 52 LV ID, ES, PLAX chordal          23  mm    23 - 38 LV fx shortening, PLAX chordal      32  %    >=29 LV PW thickness, ED            11  mm    --------- IVS/LV PW ratio, ED            1.18      <=1.3 Stroke volume, 2D             115  ml    --------- Stroke volume/bsa, 2D           47  ml/m^2  --------- LV e&', lateral              10.1 cm/s   --------- LV E/e&', lateral             6.05      --------- LV e&', medial               4.57 cm/s   --------- LV E/e&', medial               13.37     --------- LV e&', average              7.34 cm/s   --------- LV E/e&', average  8.33      ---------  Ventricular septum            Value     Reference IVS thickness, ED             13  mm    ---------  LVOT                   Value     Reference LVOT ID, S                24  mm    --------- LVOT area                 4.52 cm^2   --------- LVOT ID                  24  mm    --------- LVOT mean velocity, S           66.3 cm/s   --------- LVOT VTI, S                25.4 cm    --------- Stroke volume (SV), LVOT DP        114.9 ml    --------- Stroke index (SV/bsa), LVOT DP      47.1 ml/m^2  ---------  Aortic valve               Value     Reference Aortic valve peak velocity, S       357  cm/s   --------- Aortic valve mean velocity, S       267  cm/s   --------- Aortic valve VTI, S            90.1 cm    --------- Aortic mean gradient, S          32  mm Hg  --------- Aortic peak gradient, S          51  mm Hg  --------- VTI ratio, LVOT/AV            0.28      --------- Aortic valve area, VTI          1.27 cm^2   --------- Aortic valve area/bsa, VTI        0.52 cm^2/m^2 --------- Velocity ratio, mean, LVOT/AV       0.25      --------- Aortic valve area, mean velocity     1.12 cm^2   --------- Aortic valve area/bsa, mean        0.46 cm^2/m^2 --------- velocity  Aorta                   Value     Reference Aortic root ID, ED            32  mm    --------- Ascending aorta ID, A-P, S        42  mm    ---------  Left atrium                 Value     Reference LA ID, A-P, ES              44.96 mm    --------- LA ID/bsa, A-P              1.84 cm/m^2  <=2.2 LA volume, S               59.7 ml    --------- LA volume/bsa, S  24.5 ml/m^2  --------- LA volume, ES, 1-p A4C          42.1 ml    --------- LA volume/bsa, ES, 1-p A4C        17.2 ml/m^2  --------- LA volume, ES, 1-p A2C          78.1 ml    --------- LA volume/bsa, ES, 1-p A2C        32  ml/m^2  ---------  Mitral valve               Value     Reference Mitral E-wave peak velocity        61.1 cm/s   --------- Mitral deceleration time     (H)   264  ms    150 - 230 Mitral E/A ratio, peak          0.7      ---------  Pulmonary arteries            Value     Reference PA pressure, S, DP            29  mm Hg  <=30  Tricuspid valve              Value     Reference Tricuspid regurg peak velocity      255  cm/s   --------- Tricuspid peak RV-RA gradient       26  mm Hg  ---------  Systemic veins              Value     Reference Estimated CVP               3   mm Hg  ---------  Right ventricle              Value     Reference RV pressure, S, DP            29  mm Hg  <=30 RV s&', lateral, S             13.8 cm/s   ---------  Legend: (L) and (H) mark values outside specified reference range.  ------------------------------------------------------------------- Prepared and Electronically Authenticated by  Dorris Carnes, M.D. 2016-03-11T14:42:28       CARDIAC CATHETERIZATION Procedures    Right/Left Heart Cath and Coronary Angiography    Conclusion     Mid RCA lesion, 25%  stenosed.  The left ventricular systolic function is normal.  Normal LV function with an ejection fraction of 60-65%.  Severely calcified aortic valve with reduced excursion, and evidence for severe aortic valve stenosis with a peak to peak gradient of 56, a mean gradient of 49.3, and a calculated valve area of 0.69 cm.  Dilated aortic root.  Mild nonobstructive CAD with widely patent stents in the proximal and mid LAD; patent stent in the ramus intermediate vessel; normal left circumflex coronary artery; and dominant RCA with 20-30% mid nonobstructive stenosis.  RECOMMENDATION: The patient will require aortic valve replacement surgery. Dr. Radford Pax was notified. Surgical consultation will be obtained as an outpatient for consideration of TAVR versus open procedure.     Indications    Aortic stenosis [I35.0 (ICD-10-CM)]    Technique and Indications    Mr. Jafari Morrical is an 82 year old patient of Dr. Radford Pax who has a history of previously documented at least moderate aortic stenosis. He has undergone prior stenting to his LAD and high marginal/ramus intermediate vessel. He recently has developed progressive increasing shortness of breath with  minimal activity. He is referred for right left heart catheterization.  The patient was brought to the second floor [facility] cath lab in the fasting state. Versed 2 mg and fentanyl 25 mcg were administered for conscious sedation. The right groin was prepped and draped in sterile fashion and a 5 [place] arterial sheath and 7 French venous sheath were inserted without difficulty. A Swan-Ganz catheter was advanced into the venous sheath and pressures were obtained in the right atrium, right ventricle, pulmonary artery, and pulmonary capillary wedge position. Cardiac outputs were obtained by the thermodilution and assumed Fick methods. Oxygen saturation was obtained in the pulmonary artery and aorta. A pigtail catheter was inserted and  simultaneous AO/PA pressures were recorded. The pigtail catheter was advanced into the left ventricle and simultaneous left ventricular and PCW pressures were recorded. Left ventriculography was performed in the RAO projection. A left ventricle to aorta pullback was performed. The pigtail catheter was then removed and diagnostic catheterization to delineate the coronary anatomy was performed utilizing 5 French Judkins 4 left and right diagnostic catheters. All catheters were removed and the patient. Hemostasis was obtained by direct manual pressure. The patient tolerated the procedure well and returned to his room in satisfactory condition.    During this procedure the patient was administered a total of Versed 2 mg and Fentanyl 25 mg to achieve and maintain moderate conscious sedation. The patient's heart rate, blood pressure, and oxygen saturation are monitored continuously during the procedure. The period of conscious sedation is 60 minutes, of which I was present face-to-face 100% of this time. : Estimated blood loss <50 mL. There were no immediate complications during the procedure.    Coronary Findings    Dominance: Right   Left Anterior Descending   . Mid LAD lesion, 0% stenosed. Previously placed Mid LAD stent (unknown type) is patent.   Jorene Minors LAD lesion, 0% stenosed. Previously placed Dist LAD stent (unknown type) is patent.     Ramus Intermedius   . Ramus lesion, 0% stenosed. Previously placed Ramus stent (unknown type) is patent.     Right Coronary Artery   . Mid RCA lesion, 25% stenosed.       Right Heart Pressures Hemodynamic findings consistent with mild pulmonary hypertension. Elevated LV EDP consistent with volume overload. RA: a 15 mean 13 RV: 28/10 PA: 32/11 PCWP: mean 18  LV: 198/17 PC: 15  Pullback: LV: 205/19/27 AO: 158/80    Wall Motion                 Left Heart    Left Ventricle The left ventricular size is normal. The left ventricular  systolic function is normal. The left ventricular ejection fraction is 55-65% by visual estimate. There are no wall motion abnormalities in the left ventricle.   Aortic Valve There is severe aortic valve stenosis, and no aortic valve regurgitation. The aortic valve is calcified. There is restricted aortic valve motion.   Aorta The ascending aorta is dilated.    Coronary Diagrams    Diagnostic Diagram            Implants     No implant documentation for this case.    PACS Images    Show images for Cardiac catheterization     Link to Procedure Log    Procedure Log      Hemo Data       Most Recent Value   Fick Cardiac Output  5.74 L/min   Fick Cardiac Output  Index  2.4 (L/min)/BSA   Thermal Cardiac Output  5.34 L/min   Thermal Cardiac Output Index  2.23 (L/min)/BSA   Aortic Mean Gradient  49.3 mmHg   Aortic Peak Gradient  56 mmHg   Aortic Valve Area  0.69   Aortic Value Area Index  0.29 cm2/BSA   Mitral Mean Gradient  1.3 mmHg   Mitral Peak Gradient  0 mmHg   Mitral Valve Area Index  1.46 cm2/BSA   RA A Wave  18 mmHg   RA V Wave  17 mmHg   RA Mean  17 mmHg   RV Systolic Pressure  28 mmHg   RV Diastolic Pressure  7 mmHg   RV EDP  10 mmHg   PA Systolic Pressure  32 mmHg   PA Diastolic Pressure  11 mmHg   PA Mean  17 mmHg   PW A Wave  15 mmHg   PW V Wave  17 mmHg   PW Mean  15 mmHg   AO Systolic Pressure  0000000 mmHg   AO Diastolic Pressure  80 mmHg   AO Mean  99991111 mmHg   LV Systolic Pressure  99991111 mmHg   LV Diastolic Pressure  14 mmHg   LV EDP  17 mmHg   Arterial Occlusion Pressure Extended Systolic Pressure  123456 mmHg   Arterial Occlusion Pressure Extended Diastolic Pressure  77 mmHg   Arterial Occlusion Pressure Extended Mean Pressure  104 mmHg   Left Ventricular Apex Extended Systolic Pressure  99991111 mmHg   Left Ventricular Apex Extended Diastolic Pressure  19 mmHg   Left Ventricular Apex Extended EDP Pressure  27  mmHg   TPVR Index  13.88 HRUI   TSVR Index  46.55 HRUI   PVR SVR Ratio  0.14   TPVR/TSVR Ratio  0.3    Pulmonary Function Tests  Baseline      Post-bronchodilator  FVC  3.83 L  (83% predicted) FVC  4.06 L  (88% predicted) FEV1  2.80 L  (85% predicted) FEV1  3.17 L  (96% predicted) FEF25-75 1.90 L  (82% predicted) FEF25-75 3.45 L  (145% predicted)  TLC  6.15 L  (80% predicted) RV  2.24 L  (79% predicted) DLCO  67% predicted   STS Risk Calculator  Procedure    AVR + CABG  Risk of Mortality   4.2% Morbidity or Mortality  27.4% Prolonged LOS   15.7% Short LOS    20.9% Permanent Stroke   2.2% Prolonged Vent Support  17.7% DSW Infection    0.6% Renal Failure    9.3% Reoperation    9.7%   Impression:  Patient has stage D severe symptomatic aortic stenosis. He presents with progressive symptoms of exertional shortness of breath and fatigue consistent with chronic diastolic congestive heart failure, New [place] Heart Association functional class III. He now is severely limited and gets short of breath with minimal activity. He has had several dizzy spells without syncope. I have personally reviewed the patient's most recent transthoracic echocardiogram which was performed more than one year ago. At that time the patient had at least moderate aortic stenosis with severe thickening, calcification, and restricted leaflet mobility involving all 3 leaflets of the aortic valve. Peak velocity across the aortic valve at that time was measured 3.6 m/s corresponding to mean transvalvular gradient estimated 32 mmHg. The dimensionless velocity index was 0.25.  The patient's symptoms have continued to progress over the past year, and diagnostic cardiac catheterization performed last week confirmed the presence  of severe aortic stenosis with peak to peak and mean transvalvular gradients measured 56 and 49 mmHg respectively corresponding to aortic valve area calculated 0.69 cm.  The patient has known  history of coronary artery disease and underwent multivessel PCI and stenting in the distant past. Diagnostic catheterization confirms continued patency and all the stents with otherwise moderate nonobstructive disease.  The patient has never had any symptoms of exertional chest pain or chest tightness.  I agree that the patient would likely benefit from aortic valve replacement.  Risks associated with conventional surgical aortic valve replacement with or without coronary artery bypass grafting would be at least moderately elevated because of the patient's age and numerous comorbid medical problems. Given his somewhat limited physical mobility, degree of deconditioning, and underlying mild dementia I feel that the risks associated with conventional surgery might be considerably higher. Transcatheter aortic valve replacement may prove to be a far less invasive and potentially less risky alternative.   Plan:  The patient and his wife were counseled at length regarding treatment alternatives for management of severe symptomatic aortic stenosis. Alternative approaches such as conventional aortic valve replacement, transcatheter aortic valve replacement, and palliative medical therapy were compared and contrasted at length.  The risks associated with conventional surgical aortic valve replacement were been discussed in detail, as were expectations for post-operative convalescence. Long-term prognosis with medical therapy was discussed. This discussion was placed in the context of the patient's own specific clinical presentation and past medical history.  All of their questions been addressed.  The patient is interested in proceeding with further diagnostic workup to consider transcatheter aortic valve replacement as an alternative to high-risk conventional surgery. He will undergo routine transthoracic echocardiogram, cardiac gated CT angiogram of the heart, and CT angiogram of the aorta and iliac vessels. He  will be referred for formal physical therapy consultation. In addition, the patient will be referred to the dental clinic for routine dental examination and cleaning. He will return to our office in the near future to review the results of these tests and discuss treatment options further. He hopes to proceed with definitive intervention as soon as possible.   I spent in excess of 90 minutes during the conduct of this office consultation and >50% of this time involved direct face-to-face encounter with the patient for counseling and/or coordination of their care.    Valentina Gu. Roxy Manns, MD 04/30/2015 10:39 AM

## 2015-05-01 ENCOUNTER — Encounter (HOSPITAL_COMMUNITY): Payer: Self-pay | Admitting: Dentistry

## 2015-05-01 ENCOUNTER — Ambulatory Visit (HOSPITAL_COMMUNITY): Payer: Self-pay | Admitting: Dentistry

## 2015-05-01 VITALS — BP 146/72 | HR 57 | Temp 97.5°F

## 2015-05-01 DIAGNOSIS — K0530 Chronic periodontitis, unspecified: Secondary | ICD-10-CM

## 2015-05-01 DIAGNOSIS — K036 Deposits [accretions] on teeth: Secondary | ICD-10-CM

## 2015-05-01 DIAGNOSIS — Z9189 Other specified personal risk factors, not elsewhere classified: Secondary | ICD-10-CM

## 2015-05-01 DIAGNOSIS — K030 Excessive attrition of teeth: Secondary | ICD-10-CM

## 2015-05-01 DIAGNOSIS — M278 Other specified diseases of jaws: Secondary | ICD-10-CM

## 2015-05-01 DIAGNOSIS — I350 Nonrheumatic aortic (valve) stenosis: Secondary | ICD-10-CM

## 2015-05-01 DIAGNOSIS — K029 Dental caries, unspecified: Secondary | ICD-10-CM

## 2015-05-01 DIAGNOSIS — IMO0002 Reserved for concepts with insufficient information to code with codable children: Secondary | ICD-10-CM

## 2015-05-01 DIAGNOSIS — K083 Retained dental root: Secondary | ICD-10-CM

## 2015-05-01 DIAGNOSIS — Z01818 Encounter for other preprocedural examination: Secondary | ICD-10-CM

## 2015-05-01 DIAGNOSIS — M264 Malocclusion, unspecified: Secondary | ICD-10-CM

## 2015-05-01 DIAGNOSIS — K08409 Partial loss of teeth, unspecified cause, unspecified class: Secondary | ICD-10-CM

## 2015-05-01 MED ORDER — CHLORHEXIDINE GLUCONATE 0.12 % MT SOLN: OROMUCOSAL | Status: DC

## 2015-05-01 NOTE — Progress Notes (Signed)
DENTAL CONSULTATION  Date of Consultation:  05/01/2015 Patient Name:   [name] Date of Birth:   1933/04/25 Medical Record Number: [id number]  VITALS: BP 146/72 mmHg  Pulse 57  Temp(Src) 97.5 F (36.4 C) (Oral)  CHIEF COMPLAINT: Patient was referred by Dr. Roxy Manns for a dental consultation.   HPI: [name] is an 82 year old male recently diagnosed with severe aortic stenosis. Patient with anticipated transcatheter aortic valve replacement with Dr. Roxy Manns. Patient is now seen as part of a medically necessary pre-heart valve surgery dental protocol examination.  The patient currently denies acute toothaches, swellings, or abscesses. The patient was last seen approximately 12 months ago for a dental cleaning. This was with Dr. Lyda Kalata in [place], [place]. Patient had a dental extraction just prior to that appointment. There were no complications with that dental extraction. His dentist has since retired by patient report. Patient denies having any partial dentures. Patient denies being a dental phobic. Patient indicates that his Plavix therapy was discontinued several days before the extraction of the previous tooth with no significant bleeding after the dental extraction.  PROBLEM LIST: Patient Active Problem List   Diagnosis Date Noted  . Aortic stenosis, severe     Priority: High  . Chronic diastolic congestive heart failure ([place])   . CAD in native artery   . H/O total knee replacement 02/21/2014  . SOB (shortness of breath) 10/20/2013  . Hypersomnia 10/20/2013  . Edema 04/13/2013  . Essential hypertension, benign 04/12/2013  . Coronary atherosclerosis of native coronary artery   . Pure hypercholesterolemia   . Aortic stenosis   . PNA (pneumonia) 03/21/2013  . Influenza with respiratory manifestations 03/19/2013  . Osteoarthritis of left knee 09/19/2011  . Lateral meniscus tear, current 09/19/2011  . Medial meniscus, posterior horn derangement 09/19/2011     PMH: Past Medical History  Diagnosis Date  . Hypertension   . Heart murmur   . BPH (benign prostatic hypertrophy)   . GERD (gastroesophageal reflux disease)   . Anxiety   . Headache(784.0)     relieved with OTC's  . Dementia     early per pt- recently evaluated at [facility]- eccho7/13  , MRI head EPIC 6/13  . Bruises easily   . Unsteady gait   . Hyperlipidemia   . HBP (high blood pressure)   . Allergic rhinitis   . Chronic fatigue   . Left carotid bruit     w minimal obstruction on doppler  . OAB (overactive bladder)   . Dementia, vascular     Dr [name]  . Coronary artery disease     s/p PCI of left cir and LAD 2005 and PCI of prox PDA 7/09  . Shortness of breath dyspnea     with ADLs  . Arthritis   . Osteoarthritis of left knee   . Coronary atherosclerosis of native coronary artery   . Pure hypercholesterolemia   . Essential hypertension, benign   . Aortic insufficiency   . Aortic stenosis, severe   . Chronic diastolic congestive heart failure ([facility])     PSH: Past Surgical History  Procedure Laterality Date  . Transurethral resection of prostate    . Turp vaporization    . Cardiac catheterization      3 stents first cath 2 stents placed then additional stent placed  . Hemorroidectomy    . Rotator cuff repair  3/13    right  . Tonsillectomy    . Knee arthroscopy  09/19/2011    Procedure: ARTHROSCOPY KNEE;  Surgeon: [name], MD;  Location: [facility];  Service: Orthopedics;  Laterality: Left;  . Colonscopy     . Eye surgery      cataract surgery bilat   . Total knee arthroplasty Left 02/21/2014    Procedure: LEFT TOTAL KNEE ARTHROPLASTY;  Surgeon: [name], MD;  Location: [facility];  Service: Orthopedics;  Laterality: Left;  . Stent placed      x3 per pt  . Cardiac catheterization N/A 04/19/2015    Procedure: Right/Left Heart Cath and Coronary Angiography;  Surgeon: [name], MD;  Location: [place] [facility];  Service: Cardiovascular;   Laterality: N/A;    ALLERGIES: Allergies  Allergen Reactions  . Latex Rash  . Sulfa Antibiotics Hives and Rash    MEDICATIONS: Current Outpatient Prescriptions  Medication Sig Dispense Refill  . amLODipine (NORVASC) 5 MG tablet TAKE 1 TABLET (5 MG TOTAL) BY MOUTH DAILY. 30 tablet 5  . aspirin 81 MG tablet Take 81 mg by mouth daily.    . carvedilol (COREG) 12.5 MG tablet TAKE 1 TABLET TWICE DAILY WITH MEALS 180 tablet 0  . clopidogrel (PLAVIX) 75 MG tablet Take 1 tablet (75 mg total) by mouth daily. 90 tablet 1  . diclofenac (VOLTAREN) 75 MG EC tablet Take 75 mg by mouth 2 (two) times daily.    [name] Kitchen donepezil (ARICEPT) 10 MG tablet Take 10 mg by mouth at bedtime.    . finasteride (PROSCAR) 5 MG tablet Take 5 mg by mouth daily.    . furosemide (LASIX) 20 MG tablet TAKE 1 TABLET EVERY DAY AFTER BREAKFAST 90 tablet 0  . losartan (COZAAR) 50 MG tablet Take 50 mg by mouth 2 (two) times daily.    . Multiple Vitamin (MULTIVITAMIN WITH MINERALS) TABS Take 1 tablet by mouth daily.    [name] Kitchen omeprazole (PRILOSEC) 40 MG capsule Take 1 capsule (40 mg total) by mouth daily. 90 capsule 2  . potassium chloride SA (K-DUR,KLOR-CON) 20 MEQ tablet Take 1 tablet (20 mEq total) by mouth daily. 90 tablet 2  . rosuvastatin (CRESTOR) 20 MG tablet Take 0.5 tablets (10 mg total) by mouth daily. 45 tablet 2  . sertraline (ZOLOFT) 50 MG tablet Take 50 mg by mouth every morning.     [name] Kitchen acetaminophen (TYLENOL) 325 MG tablet Take 650 mg by mouth every 6 (six) hours as needed for mild pain.    . chlorhexidine (PERIDEX) 0.12 % solution Rinse with 15 mls twice daily for 30 seconds. Use after breakfast and at bedtime. Spit out excess. Do not swallow. 960 mL prn  . nitroGLYCERIN (NITROSTAT) 0.4 MG SL tablet Place 1 tablet (0.4 mg total) under the tongue every 5 (five) minutes as needed. For chest pain (Patient not taking: Reported on 05/01/2015) 25 tablet 3   No current facility-administered medications for this visit.     LABS: Lab Results  Component Value Date   WBC 5.9 04/18/2015   HGB 16.3 04/18/2015   HCT 48.1 04/18/2015   MCV 92.0 04/18/2015   PLT 220 04/18/2015      Component Value Date/Time   NA 141 04/18/2015 1154   K 3.8 04/18/2015 1154   CL 105 04/18/2015 1154   CO2 25 04/18/2015 1154   GLUCOSE 101* 04/18/2015 1154   BUN 15 04/18/2015 1154   CREATININE 1.03 04/18/2015 1154   CREATININE 0.95 02/23/2015 0115   CALCIUM 9.3 04/18/2015 1154   GFRNONAA >60 02/23/2015 0115  GFRAA >60 02/23/2015 0115   Lab Results  Component Value Date   INR 1.06 04/18/2015   INR 1.00 02/14/2014   INR 1.0 08/18/2007   No results found for: PTT  SOCIAL HISTORY: Social History   Social History  . Marital Status: Married    Spouse Name: N/A  . Number of Children: 3  . Years of Education: N/A   Occupational History  . retired    Social History Main Topics  . Smoking status: Former Smoker -- 20 years    Types: Cigars    Quit date: 02/10/1970  . Smokeless tobacco: Former Systems developer    Types: Chew     Comment: smoked cigars off and on x 20 years  . Alcohol Use: 1.8 oz/week    3 Glasses of wine per week     Comment: weekly/    occ beer/wine  . Drug Use: No  . Sexual Activity: Not Currently   Other Topics Concern  . Not on file   Social History Narrative    FAMILY HISTORY: Family History  Problem Relation Age of Onset  . Anesthesia problems Neg Hx   . Lung cancer Father   . Heart attack Neg Hx     REVIEW OF SYSTEMS: As per History of Present illness. Psych: Positive for dementia. Negative for dental phobia.  DENTAL HISTORY: CHIEF COMPLAINT: Patient was referred by Dr. Roxy Manns for a dental consultation.   HPI: CHRSITOPHER CHASEY is an 82 year old male recently diagnosed with severe aortic stenosis. Patient with anticipated transcatheter aortic valve replacement with Dr. Roxy Manns. Patient is now seen as part of a medically necessary pre-heart valve surgery dental protocol  examination.  The patient currently denies acute toothaches, swellings, or abscesses. The patient was last seen approximately 12 months ago for a dental cleaning. This was with Dr. Lyda Kalata in [place], [place]. Patient had a dental extraction just prior to that appointment. There were no complications with that dental extraction. His dentist has since retired by patient report. Patient denies having any partial dentures. Patient denies being a dental phobic. Patient indicates that his Plavix therapy was discontinued several days before the extraction of the previous tooth with no significant bleeding after the dental extraction.   DENTAL EXAMINATION: GENERAL: The patient is a well-developed, well-nourished male in no acute distress. The patient is oriented to person, place, and time. The patient presents with his spouse. HEAD AND NECK: There is no palpable submandibular lymphadenopathy. There are note thyroid masses. Patient denies acute TMJ symptoms. Maximum interincisal opening is measured at 40 mm. INTRAORAL EXAM: Patient has normal saliva. There are facial exostoses in the area of tooth numbers 21 through 24, and 25 through 27. DENTITION: The patient is missing tooth numbers 1, 2, 15, 16, 17, 28, 29, 30. There is a retained root tip in the area of tooth #28/29. PERIODONTAL: Patient has chronic periodontitis with plaque accumulations, gingival recession, and no significant tooth mobility. There is incipient to moderate bone loss noted. DENTAL CARIES/SUBOPTIMAL RESTORATIONS: There are rampant dental caries noted as per dental charting form. There is generalized maxillary and mandibular anterior attrition. ENDODONTIC: The patient currently denies acute pulpitis symptoms. I do not see any evidence of periapical pathology or radiolucency. Patient has had previous root canal therapy associated with tooth #19. CROWN AND BRIDGE: Patient has stainless steel crown restorations on permanent tooth  numbers 14, 18, 19, 31, and 32. PROSTHODONTIC: Patient denies having any partial dentures. OCCLUSION: Patient has a poor occlusal  scheme secondary to multiple missing teeth, deep overbite, generalized excessive maxillary and mandibular interincisal attrition, and lack of replacement of missing teeth with dental prostheses.  RADIOGRAPHIC INTERPRETATION: An orthopantogram was taken and supplemented with a full series of dental radiographs. There are multiple missing teeth. There are multiple diastemas. There is a retained root tip in the area of tooth numbers 28/29. There is incipient to moderate bone loss. There are rampant dental caries noted. There is supra-eruption and drifting of the unopposed teeth into the edentulous areas. There are no obvious periapical radiolucencies. There is a previous root canal therapy associated with tooth #19. Multiple dental restorations are noted. There are stainless steel crown restorations on permanent tooth numbers 14, 18, 19, 31, and 32.  ASSESSMENTS: 1. Severe aortic stenosis 2. Pre-heart valve surgery dental protocol 3. Rampant dental caries 4. Excessive maxillary and mandibular interincisal attrition 5. Multiple missing teeth 6. Multiple diastemas 7. Chronic periodontitis with bone loss 8. Accretions 9. Gingival recession 10. Supra-eruption and drifting of the unopposed teeth into the edentulous areas 11. Deep overbite 12. Malocclusion 13. Facial exostoses in the area of tooth numbers 22 through 24 and 25 through 27. 14. Risk for bleeding with invasive dental procedures due to current Plavix therapy 15. Risk for complications with invasive dental procedures due to cardiovascular compromise 16. Need for antibiotic premedication prior to invasive dental procedures due to history of previous total knee replacement and anticipated heart valve surgery.  PLAN/RECOMMENDATIONS: 1. I discussed the risks, benefits, and complications of various treatment  options with the patient in relationship to his medical and dental conditions, anticipated heart valve surgery, and risk for endocarditis. We discussed various treatment options to include no treatment, total and subtotal extractions with alveoloplasty, pre-prosthetic surgery as indicated, periodontal therapy, dental restorations, root canal therapy, crown and bridge therapy, implant therapy, and replacement of missing teeth as indicated. The patient and wife currently wish to proceed with TAVR procedure with Dr. Roxy Manns as soon as possible.  The patient will then follow-up with a primary dentist for periodontal therapy and multiple dental restorations as indicated once he is medically stable from his anticipated heart valve surgery. The patient will require antibiotic premedication as per American Heart Association guidelines at that time.  The patient and wife are aware that although he has no current acute toothaches, swellings, or abscesses at this time, the patient ideally needs to have his dental treatment completed in a timely manner once he is medically stable from the anticipated heart valve surgery.  2. Discussion of findings with medical team and coordination of future medical and dental care as needed. Dr. Roxy Manns has been contacted and will proceed with scheduling of TAVR procedure once additional tests have been completed. Dr. Roxy Manns will contact dental medicine in the interim if acute dental problems arise before he is able to follow-up with his new primary dentist.     Lenn Cal, DDS

## 2015-05-01 NOTE — Patient Instructions (Signed)
[place] [facility]    Department of Dental Medicine     DR. KULINSKI      HEART VALVES AND MOUTH CARE:  FACTS:   If you have any infection in your mouth, it can infect your heart valve.  If you heart valve is infected, you will be seriously ill.  Infections in the mouth can be SILENT and do not always cause pain.  Examples of infections in the mouth are gum disease, dental cavities, and abscesses.  Some possible signs of infection are: Bad breath, bleeding gums, or teeth that are sensitive to sweets, hot, and/or cold. There are many other signs as well.  WHAT YOU HAVE TO DO:   Brush your teeth after meals and at bedtime. Spend at least 2 minutes brushing well, especially behind your back teeth and all around your teeth that stand alone. Brush at the gumline also.  Do not go to bed without brushing your teeth and flossing.  If you gums bleed when you brush or floss, do NOT stop brushing or flossing. It usually means that your gums need more attention and better cleaning.   If your Dentist or Dr. Kulinski gave you a prescription mouthwash to use, make sure to use it as directed. If you run out of the medication, get a refill at the pharmacy.   If you were given any other medications or directions by your Dentist, please follow them. If you did not understand the directions or forget what you were told, please call. We will be happy to refresh her memory.  If you need antibiotics before dental procedures, make sure you take them one hour prior to every dental visit as directed.   Get a dental checkup every 4-6 months in order to keep your mouth healthy, or to find and treat any new infection. You will most likely need your teeth cleaned or gums treated at the same time.  If you are not able to come in for your scheduled appointment, call your Dentist as soon as possible to reschedule.  If you have a problem in between dental visits, call your Dentist.  

## 2015-05-05 ENCOUNTER — Other Ambulatory Visit: Payer: Self-pay | Admitting: Cardiology

## 2015-05-09 ENCOUNTER — Ambulatory Visit
Payer: Commercial Managed Care - HMO | Attending: Thoracic Surgery (Cardiothoracic Vascular Surgery) | Admitting: Physical Therapy

## 2015-05-09 ENCOUNTER — Ambulatory Visit (HOSPITAL_BASED_OUTPATIENT_CLINIC_OR_DEPARTMENT_OTHER)
Admission: RE | Admit: 2015-05-09 | Discharge: 2015-05-09 | Disposition: A | Discharge status: Home / Self Care | Payer: Commercial Managed Care - HMO | Source: Ambulatory Visit | Attending: Thoracic Surgery (Cardiothoracic Vascular Surgery) | Admitting: Thoracic Surgery (Cardiothoracic Vascular Surgery)

## 2015-05-09 ENCOUNTER — Ambulatory Visit (HOSPITAL_COMMUNITY)
Admission: RE | Admit: 2015-05-09 | Discharge: 2015-05-09 | Disposition: A | Discharge status: Home / Self Care | Payer: Commercial Managed Care - HMO | Source: Ambulatory Visit | Attending: Thoracic Surgery (Cardiothoracic Vascular Surgery) | Admitting: Thoracic Surgery (Cardiothoracic Vascular Surgery)

## 2015-05-09 ENCOUNTER — Encounter: Payer: Self-pay | Admitting: Physical Therapy

## 2015-05-09 ENCOUNTER — Ambulatory Visit (HOSPITAL_COMMUNITY): Payer: Commercial Managed Care - HMO

## 2015-05-09 ENCOUNTER — Encounter (HOSPITAL_COMMUNITY): Payer: Self-pay

## 2015-05-09 DIAGNOSIS — I119 Hypertensive heart disease without heart failure: Secondary | ICD-10-CM | POA: Diagnosis not present

## 2015-05-09 DIAGNOSIS — K760 Fatty (change of) liver, not elsewhere classified: Secondary | ICD-10-CM | POA: Diagnosis not present

## 2015-05-09 DIAGNOSIS — I2510 Atherosclerotic heart disease of native coronary artery without angina pectoris: Secondary | ICD-10-CM | POA: Diagnosis not present

## 2015-05-09 DIAGNOSIS — M6281 Muscle weakness (generalized): Secondary | ICD-10-CM | POA: Diagnosis not present

## 2015-05-09 DIAGNOSIS — R2689 Other abnormalities of gait and mobility: Secondary | ICD-10-CM | POA: Diagnosis not present

## 2015-05-09 DIAGNOSIS — I352 Nonrheumatic aortic (valve) stenosis with insufficiency: Secondary | ICD-10-CM | POA: Insufficient documentation

## 2015-05-09 DIAGNOSIS — D3502 Benign neoplasm of left adrenal gland: Secondary | ICD-10-CM | POA: Diagnosis not present

## 2015-05-09 DIAGNOSIS — I350 Nonrheumatic aortic (valve) stenosis: Secondary | ICD-10-CM

## 2015-05-09 DIAGNOSIS — R0602 Shortness of breath: Secondary | ICD-10-CM | POA: Diagnosis not present

## 2015-05-09 DIAGNOSIS — I700 Atherosclerosis of aorta: Secondary | ICD-10-CM | POA: Diagnosis not present

## 2015-05-09 DIAGNOSIS — D3501 Benign neoplasm of right adrenal gland: Secondary | ICD-10-CM | POA: Insufficient documentation

## 2015-05-09 DIAGNOSIS — I358 Other nonrheumatic aortic valve disorders: Secondary | ICD-10-CM | POA: Diagnosis not present

## 2015-05-09 DIAGNOSIS — Z01818 Encounter for other preprocedural examination: Secondary | ICD-10-CM | POA: Diagnosis not present

## 2015-05-09 DIAGNOSIS — N400 Enlarged prostate without lower urinary tract symptoms: Secondary | ICD-10-CM | POA: Insufficient documentation

## 2015-05-09 DIAGNOSIS — R293 Abnormal posture: Secondary | ICD-10-CM | POA: Insufficient documentation

## 2015-05-09 MED ORDER — IOHEXOL 350 MG/ML SOLN
80.0000 mL | Freq: Once | INTRAVENOUS | Status: AC | PRN
  Administered 2015-05-09: 80 mL via INTRAVENOUS

## 2015-05-09 MED ORDER — IOHEXOL 350 MG/ML SOLN
70.0000 mL | Freq: Once | INTRAVENOUS | Status: AC | PRN
  Administered 2015-05-09: 70 mL via INTRAVENOUS

## 2015-05-09 NOTE — Therapy (Signed)
[place], [place], [address] Phone: [phone]   Fax:  [phone]  Physical Therapy Evaluation  Patient Details  Name: [name] MRN: [id number] Date of Birth: [birth year] Referring Provider: Dr. Darylene Price  Encounter Date: 05/09/2015      PT End of Session - 05/09/15 1354    Visit Number 1   PT Start Time [id number]   PT Stop Time 0955   PT Time Calculation (min) 50 min   Equipment Utilized During Treatment Gait belt      Past Medical History  Diagnosis Date  . Hypertension   . Heart murmur   . BPH (benign prostatic hypertrophy)   . GERD (gastroesophageal reflux disease)   . Anxiety   . Headache(784.0)     relieved with OTC's  . Dementia     early per pt- recently evaluated at [facility]- eccho7/13  , MRI head EPIC 6/13  . Bruises easily   . Unsteady gait   . Hyperlipidemia   . HBP (high blood pressure)   . Allergic rhinitis   . Chronic fatigue   . Left carotid bruit     w minimal obstruction on doppler  . OAB (overactive bladder)   . Dementia, vascular     Dr [name]  . Coronary artery disease     s/p PCI of left cir and LAD 2005 and PCI of prox PDA 7/09  . Shortness of breath dyspnea     with ADLs  . Arthritis   . Osteoarthritis of left knee   . Coronary atherosclerosis of native coronary artery   . Pure hypercholesterolemia   . Essential hypertension, benign   . Aortic insufficiency   . Aortic stenosis, severe   . Chronic diastolic congestive heart failure [facility])     Past Surgical History  Procedure Laterality Date  . Transurethral resection of prostate    . Turp vaporization    . Cardiac catheterization      3 stents first cath 2 stents placed then additional stent placed  . Hemorroidectomy    . Rotator cuff repair  3/13    right  . Tonsillectomy    . Knee arthroscopy  09/19/2011    Procedure: ARTHROSCOPY KNEE;  Surgeon: [name], MD;  Location: [facility];   Service: Orthopedics;  Laterality: Left;  . Colonscopy     . Eye surgery      cataract surgery bilat   . Total knee arthroplasty Left 02/21/2014    Procedure: LEFT TOTAL KNEE ARTHROPLASTY;  Surgeon: [name], MD;  Location: [facility];  Service: Orthopedics;  Laterality: Left;  . Stent placed      x3 per pt  . Cardiac catheterization N/A 04/19/2015    Procedure: Right/Left Heart Cath and Coronary Angiography;  Surgeon: [name], MD;  Location: [place] [facility];  Service: Cardiovascular;  Laterality: N/A;    There were no vitals filed for this visit.  Visit Diagnosis:  Abnormal posture - Plan: PT plan of care cert/re-cert  Other abnormalities of gait and mobility - Plan: PT plan of care cert/re-cert  Muscle weakness (generalized) - Plan: PT plan of care cert/re-cert      Subjective Assessment - 05/09/15 0913    Subjective Pt/wife report active lifestyle until SOB started about 2 years ago. SOB continues to progress. Pt denies chest pain/palpitations, dizziness, or syncope. Pt also reports leg fatigue.    Patient Stated Goals return to more activity  without SOB   Pain Score 0-No pain            [facility] PT Assessment - 05/09/15 0001    Assessment   Medical Diagnosis severe aortic stenosis   Referring Provider Dr. Darylene Price   Onset Date/Surgical Date 04/30/15  surgical consult   Precautions   Precautions None   Restrictions   Weight Bearing Restrictions No   Balance Screen   Has the patient fallen in the past 6 months Yes   How many times? 4   Has the patient had a decrease in activity level because of a fear of falling?  No   Is the patient reluctant to leave their home because of a fear of falling?  No   Home [employer] residence   Living Arrangements Spouse/significant other   [place] to enter   Entrance Stairs-Number of Steps 4   Entrance Stairs-Rails Left;Right;Can reach both   [place] One level   Prior  Function   Level of Independence Independent   Cognition   Overall Cognitive Status Within Functional Limits for tasks assessed  reports mild dementia   Posture/Postural Control   Posture/Postural Control Postural limitations   Postural Limitations Forward head;Rounded Shoulders   ROM / Strength   AROM / PROM / Strength AROM;Strength   AROM   Overall AROM Comments grossly WNL   Strength   Overall Strength Comments grossly 4 to 5/5 throughout UE and lower extremities   Strength Assessment Site Hand   Right/Left hand Right;Left   Right Hand Grip (lbs) 78  R hand dominant   Left Hand Grip (lbs) 75   Ambulation/Gait   Assistive device Straight cane   Gait Comments Pt ambulates slowly yet steadily with a forward flexed posture.           [facility] Pre-Surgical Assessment - 05/09/15 0001    5 Meter Walk Test- trial 1 9.3 sec   5 Meter Walk Test- trial 2 11 sec.    5 Meter Walk Test- trial 3 9 sec.  <6 sec = slow; 1.63 ft/sec (<1.8 ft/sec = incr. fall risk)   5 meter walk test average 9.77 sec   Timed Up & Go Test trial  26.5 sec.  with single point cane   Comments > 12 sec indicates increased fall risk   4 Stage Balance Test tolerated for:  10 sec.   4 Stage Balance Test Position 4   Comment unable to rise without UE support   ADL/IADL Independent with: Bathing;Dressing   ADL/IADL Needs Assistance with: Meal prep;Finances;Yard work   ADL/IADL Therapist, sports Index Midly frail   6 Minute Walk- Baseline yes   BP (mmHg) 126/70 mmHg   HR (bpm) 58   02 Sat (%RA) 96 %   Modified Borg Scale for Dyspnea 4- somewhat severe   Perceived Rate of Exertion (Borg) 6-   6 Minute Walk Post Test yes   BP (mmHg) 146/78 mmHg   HR (bpm) 77   02 Sat (%RA) 97 %   Modified Borg Scale for Dyspnea 4- somewhat severe   Perceived Rate of Exertion (Borg) 14-   Aerobic Endurance Distance Walked 460   Endurance additional comments No rests required. Straight cane utilized. Slow gait speed. No significant  increased in resting SOB which he rated at 4/10. PT observed increased respirations with activity although subjective ratings do not reflect this.  PT Education - 05-31-2015 1354    Education provided Yes   Education Details benefits of frequent short walks and avoiding long periods of inactivity   Person(s) Educated Patient;Spouse   Methods Explanation   Comprehension Verbalized understanding                    Plan - 2015/05/31 1355    Clinical Impression Statement Pt is an 82 yo male presenting to OP PT for evaluation prior to possible TAVR surgery due to severe aortic stenosis. Pt's wife is present throughout. Pt/wife report that pt was active up until about 2 years ago when exertional dyspnea began and has subsequently progressed to the point he feels SOB even at rest. Pt does not move around much now due to his SOB. Pt presents with good ROM and mild muscle weakness throughout with 4-5/10 muscle strength throughout. Pt's static balance is good while his dyanmic balance is fair. Pt is at high fall risk per Timed up and go and overall gait velocity. Pt's gait speed is slow but functional. Pt ambulated 460' with straight cane during 6 minute walk test. He reported that his SOB remained moderate. HR and O2 increased slightly. Systolic BP increased 20 mmHg.   PT Frequency One time visit   Consulted and Agree with Plan of Care Patient          G-Codes - 05-31-15 1402    Functional Assessment Tool Used 6 minute walk 460' with cane   Functional Limitation Mobility: Walking and moving around   Mobility: Walking and Moving Around Current Status [phone]) At least 60 percent but less Martin 80 percent impaired, limited or restricted   Mobility: Walking and Moving Around Goal Status [phone]) At least 60 percent but less Martin 80 percent impaired, limited or restricted   Mobility: Walking and Moving Around Discharge Status [phone]) At least 60 percent but  less Martin 80 percent impaired, limited or restricted       Problem List Patient Active Problem List   Diagnosis Date Noted  . Chronic diastolic congestive heart failure ([place])   . Aortic stenosis, severe   . CAD in native artery   . H/O total knee replacement 02/21/2014  . SOB (shortness of breath) 10/20/2013  . Hypersomnia 10/20/2013  . Edema 04/13/2013  . Essential hypertension, benign 04/12/2013  . Coronary atherosclerosis of native coronary artery   . Pure hypercholesterolemia   . Aortic stenosis   . PNA (pneumonia) 03/21/2013  . Influenza with respiratory manifestations 03/19/2013  . Osteoarthritis of left knee 09/19/2011  . Lateral meniscus tear, current 09/19/2011  . Medial meniscus, posterior horn derangement 09/19/2011    Maryelizabeth Eberle, PT May 31, 2015, 2:04 PM  [facility] [address] [place], [place], [address] Phone: [phone]   Fax:  [phone]  Name: [name] MRN: [id number] Date of Birth: 11/22/1933

## 2015-05-09 NOTE — Progress Notes (Signed)
  Echocardiogram 2D Echocardiogram has been performed.  [name] M 05/09/2015, 2:48 PM

## 2015-05-10 ENCOUNTER — Ambulatory Visit: Payer: Commercial Managed Care - HMO | Admitting: Physician Assistant

## 2015-05-10 ENCOUNTER — Encounter: Payer: Self-pay | Admitting: Surgery

## 2015-05-10 ENCOUNTER — Institutional Professional Consult (permissible substitution) (INDEPENDENT_AMBULATORY_CARE_PROVIDER_SITE_OTHER): Payer: Commercial Managed Care - HMO | Admitting: Surgery

## 2015-05-10 VITALS — BP 145/82 | HR 62 | Resp 20 | Ht 74.0 in | Wt 253.0 lb

## 2015-05-10 DIAGNOSIS — I5032 Chronic diastolic (congestive) heart failure: Secondary | ICD-10-CM | POA: Diagnosis not present

## 2015-05-10 DIAGNOSIS — I350 Nonrheumatic aortic (valve) stenosis: Secondary | ICD-10-CM | POA: Diagnosis not present

## 2015-05-11 ENCOUNTER — Other Ambulatory Visit: Payer: Self-pay | Admitting: *Deleted

## 2015-05-11 ENCOUNTER — Other Ambulatory Visit: Payer: Self-pay | Admitting: Cardiology

## 2015-05-11 DIAGNOSIS — I350 Nonrheumatic aortic (valve) stenosis: Secondary | ICD-10-CM

## 2015-05-11 NOTE — Telephone Encounter (Signed)
Patient is requesting refill on his Finasteride 5 mg

## 2015-05-13 ENCOUNTER — Encounter: Payer: Self-pay | Admitting: Surgery

## 2015-05-13 NOTE — Progress Notes (Signed)
Patient ID: RAMZEY FEDAK, male   DOB: 1933-08-15, 82 y.o.   MRN: [id number]  [place] SURGERY CONSULTATION REPORT  Referring Provider is Sueanne Margarita, MD PCP is [name], MD  Chief Complaint  Patient presents with  . Aortic Stenosis    severe..2nd TAVR eval...review all studies and schedule surgery    HPI:  The patient is an 82 year old gentleman with a history of aortic stenosis, coronary artery disease s/p PCI and stenting of the LAD and LCX in 2005 and PDA in 2009, hypertension and mild dementia. He was found to have a heart murmur and echocardiogram in 08/2011 showed moderate AS with a mean gradient of 25 mm Hg. He has been followed by Dr. Radford Pax with serial echos and has had chronic shortness of breath for several years that has progressed to the point where he is getting short of breath walking across the room. He had a negative nuclear stress test in 2015. Echo has shown progression of his AS with a mean gradient of 32 mm Hg in 04/2014. He underwent a pulmonary evaluation by Dr. Gwenette Greet in 05/2014 and PFT's did not show any significant COPD and no pulmonary reason for his progressive exertional dyspnea. He underwent R/L heart cath on 04/19/2015 showing non-obstructive coronary disease with patent stents and a mean AV gradient of 49.3 mm Hg and a peak to peak of 56 mm Hg with an LVEF of 60-65%. He had a follow up echo on 05/09/2015 that was read as showing a mean gradient of only 25 mm Hg with a peak velocity of 344 cm/s.  He is married and lives with his wife who is with him today. He has lived a progressively sedentary lifestyle. He has been limited primarily by severe exertional shortness of breath over the past 2 years. He states that he gets short of breath with minimal physical activity like walking to the bathroom. He has no symptoms at rest. He denies chest discomfort. He has had some dizziness. He walks  with a cane due to some balance issues that he feels are related to his arthritis of both knees with chronic pain. He has had several falls. He and his wife report mild short term memory loss but it does not affect his level of functioning. He was evaluated by a neurologist and started on Aricept two years ago. They have not noted any progression in his memory loss.   Past Medical History  Diagnosis Date  . Hypertension   . Heart murmur   . BPH (benign prostatic hypertrophy)   . GERD (gastroesophageal reflux disease)   . Anxiety   . Headache(784.0)     relieved with OTC's  . Dementia     early per pt- recently evaluated at [facility]- eccho7/13  , MRI head EPIC 6/13  . Bruises easily   . Unsteady gait   . Hyperlipidemia   . HBP (high blood pressure)   . Allergic rhinitis   . Chronic fatigue   . Left carotid bruit     w minimal obstruction on doppler  . OAB (overactive bladder)   . Dementia, vascular     Dr [name]  . Coronary artery disease     s/p PCI of left cir and LAD 2005 and PCI of prox PDA 7/09  . Shortness of breath dyspnea     with ADLs  . Arthritis   . Osteoarthritis of left knee   .  Coronary atherosclerosis of native coronary artery   . Pure hypercholesterolemia   . Essential hypertension, benign   . Aortic insufficiency   . Aortic stenosis, severe   . Chronic diastolic congestive heart failure [facility])     Past Surgical History  Procedure Laterality Date  . Transurethral resection of prostate    . Turp vaporization    . Cardiac catheterization      3 stents first cath 2 stents placed then additional stent placed  . Hemorroidectomy    . Rotator cuff repair  3/13    right  . Tonsillectomy    . Knee arthroscopy  09/19/2011    Procedure: ARTHROSCOPY KNEE;  Surgeon: [name], MD;  Location: [facility];  Service: Orthopedics;  Laterality: Left;  . Colonscopy     . Eye surgery      cataract surgery bilat   . Total knee arthroplasty Left 02/21/2014     Procedure: LEFT TOTAL KNEE ARTHROPLASTY;  Surgeon: [name], MD;  Location: [facility];  Service: Orthopedics;  Laterality: Left;  . Stent placed      x3 per pt  . Cardiac catheterization N/A 04/19/2015    Procedure: Right/Left Heart Cath and Coronary Angiography;  Surgeon: [name], MD;  Location: [place] [facility];  Service: Cardiovascular;  Laterality: N/A;    Family History  Problem Relation Age of Onset  . Anesthesia problems Neg Hx   . Lung cancer Father   . Heart attack Neg Hx     Social History   Social History  . Marital Status: Married    Spouse Name: N/A  . Number of Children: 3  . Years of Education: N/A   Occupational History  . retired    Social History Main Topics  . Smoking status: Former Smoker -- 20 years    Types: Cigars    Quit date: 02/10/1970  . Smokeless tobacco: Former Systems developer    Types: Chew     Comment: smoked cigars off and on x 20 years  . Alcohol Use: 1.8 oz/week    3 Glasses of wine per week     Comment: weekly/    occ beer/wine  . Drug Use: No  . Sexual Activity: Not Currently   Other Topics Concern  . Not on file   Social History Narrative    Current Outpatient Prescriptions  Medication Sig Dispense Refill  . acetaminophen (TYLENOL) 325 MG tablet Take 650 mg by mouth every 6 (six) hours as needed for mild pain.    [name] Kitchen amLODipine (NORVASC) 5 MG tablet TAKE 1 TABLET (5 MG TOTAL) BY MOUTH DAILY. 30 tablet 11  . aspirin 81 MG tablet Take 81 mg by mouth daily.    . chlorhexidine (PERIDEX) 0.12 % solution Rinse with 15 mls twice daily for 30 seconds. Use after breakfast and at bedtime. Spit out excess. Do not swallow. 960 mL prn  . clopidogrel (PLAVIX) 75 MG tablet Take 1 tablet (75 mg total) by mouth daily. 90 tablet 1  . diclofenac (VOLTAREN) 75 MG EC tablet Take 75 mg by mouth 2 (two) times daily. Reported on 05/09/2015    . donepezil (ARICEPT) 10 MG tablet Take 10 mg by mouth at bedtime.    . finasteride (PROSCAR) 5 MG tablet Take 5  mg by mouth daily.    . furosemide (LASIX) 20 MG tablet TAKE 1 TABLET EVERY DAY AFTER BREAKFAST 90 tablet 0  . Multiple Vitamin (MULTIVITAMIN WITH MINERALS) TABS Take 1 tablet  by mouth daily.    . nitroGLYCERIN (NITROSTAT) 0.4 MG SL tablet Place 1 tablet (0.4 mg total) under the tongue every 5 (five) minutes as needed. For chest pain 25 tablet 3  . omeprazole (PRILOSEC) 40 MG capsule Take 1 capsule (40 mg total) by mouth daily. 90 capsule 2  . potassium chloride SA (K-DUR,KLOR-CON) 20 MEQ tablet Take 1 tablet (20 mEq total) by mouth daily. 90 tablet 2  . rosuvastatin (CRESTOR) 20 MG tablet Take 0.5 tablets (10 mg total) by mouth daily. 45 tablet 2  . sertraline (ZOLOFT) 50 MG tablet Take 50 mg by mouth every morning.     . carvedilol (COREG) 12.5 MG tablet TAKE 1 TABLET TWICE DAILY WITH MEALS 180 tablet 0  . losartan (COZAAR) 50 MG tablet TAKE 1 TABLET EVERY DAY (NEED MD APPOINTMENT)  CALL  [phone] 90 tablet 0   No current facility-administered medications for this visit.    Allergies  Allergen Reactions  . Latex Rash  . Sulfa Antibiotics Hives and Rash      Review of Systems: General:normal appetite, decreased energy, no weight gain, no weight loss, no fever Cardiac:no chest pain with exertion, no chest pain at rest, + SOB with minimal exertion, no resting SOB, no PND, no orthopnea, no palpitations, no arrhythmia, no atrial fibrillation, + LE edema, + dizzy spells, no syncope Respiratory:+ shortness of breath, no home oxygen, no productive cough, + chronic dry cough, no bronchitis, no wheezing, no hemoptysis, no asthma, no pain with inspiration or cough, no sleep apnea, no CPAP at night ID:[id number] difficulty swallowing, + reflux, no frequent heartburn, no hiatal hernia, no abdominal pain, no constipation, no diarrhea, no hematochezia, no hematemesis, no  melena GU:no dysuria, + frequency, no urinary tract infection, no hematuria, + enlarged prostate, no kidney stones, no kidney disease Vascular:+ pain suggestive of claudication, + pain in feet, + leg cramps, no varicose veins, no DVT, no non-healing foot ulcer Neuro:no stroke, no TIA's, no seizures, no headaches, no temporary blindness one eye, no slurred speech, no peripheral neuropathy, + mild chronic pain, + mild instability of gait, + mild memory/cognitive dysfunction Musculoskeletal:+ arthritis, no joint swelling, no myalgias, + some difficulty walking, decreased mobility  Skin:no rash, no itching, no skin infections, no pressure sores or ulcerations Psych:no anxiety, no depression, no nervousness, no unusual recent stress Eyes:no blurry vision, no floaters, no recent vision changes, does not wears glasses or contacts ENT:no hearing loss, no loose or painful teeth, no dentures, last saw dentist several years ago Hematologic:+ easy bruising, + abnormal bleeding, no clotting disorder, no frequent epistaxis Endocrine:no diabetes, does not check CBG's at home      Physical Exam:   BP 145/82 mmHg  Pulse 62  Resp 20  Ht 6\' 2"  (1.88 m)  Wt 253 lb (114.76 kg)  BMI 32.47 kg/m2  SpO2 97%  General:  Elderly but well-appearing  HEENT:  Unremarkable , NCAT, PERLA, EOMI, oropharynx clear  Neck:   no JVD, no bruits, no adenopathy or thyromegaly  Chest:   clear to auscultation, symmetrical breath sounds, no wheezes, no rhonchi   CV:   RRR, grade III/VI crescendo/decrescendo murmur heard best at RSB,  no diastolic murmur  Abdomen:  soft,  non-tender, obese, no masses or organomegaly  Extremities:  warm, well-perfused, pulses not palpable in feet, no LE edema  Rectal/GU  Deferred  Neuro:   Grossly non-focal and symmetrical throughout  Skin:   Clean and dry, no rashes, no breakdown   Diagnostic  Tests:   [facility]            [address], [place] [address]              [phone]  ------------------------------------------------------------------- Transthoracic Echocardiography  Patient:  Jakylan, Rhodd MR #:    [id number] Study Date: 04/21/2014 Gender:   M Age:    44 Height:   188 cm Weight:   111.6 kg BSA:    2.44 m^2 Pt. Status: Room:  ORDERING   Fransico Him, MD REFERRING  Fransico Him, MD SONOGRAPHER Victorio Palm, RDCS ATTENDING  Dorris Carnes, M.D. PERFORMING  [facility], Outpatient  cc:  ------------------------------------------------------------------- LV EF: 55% -  65%  ------------------------------------------------------------------- Indications:   Aortic stenosis (I35.9).  ------------------------------------------------------------------- History:  PMH: Acquired from the patient and from the patient&'s chart. Coronary artery disease. Coronary artery disease. Moderate aortic stenosis. Moderate aortic regurgitation. Risk factors: Hypertension. Obese. Dyslipidemia.  ------------------------------------------------------------------- Study Conclusions  - Left ventricle: The cavity size was normal. Systolic function was normal. The estimated ejection fraction was in the range of 55% to 65%. Doppler parameters are consistent with abnormal left ventricular relaxation (grade 1 diastolic dysfunction). - Aortic valve: AV is thckened, calciifed with restricted motion. Peak and mean gradients through the valve are 51 and 31 mm Hg consiste with moderate AS. There was mild  regurgitation. Peak gradient (S): 51 mm Hg. - Left atrium: The atrium was mildly dilated.  Impressions:  - Since report of October 2015 there is mild increase in gradients across AV.  ------------------------------------------------------------------- Labs, prior tests, procedures, and surgery: Echocardiography (October 2015).  The aortic valve showed moderate stenosis. EF was 60% and PA pressure was 31 (systolic). Aortic valve: peak gradient of 48 mm Hg and mean gradient of 28 mm Hg.  Catheterization.  The study demonstrated coronary artery disease. There was a stenosis in the left circumflex coronary artery which was treated with a percutaneous intervention. There was a stenosis in the left anterior descending coronary artery which was treated with a percutaneous intervention. There was a stenosis in the right posterior descending coronary artery which was treated with a percutaneous intervention. Transthoracic echocardiography. M-mode, complete 2D, spectral Doppler, and color Doppler. Birthdate: Patient birthdate: September 23, 1933. Age: Patient is 82 yr old. Sex: Gender: male. BMI: 31.6 kg/m^2. Blood pressure:   144/72 Patient status: Outpatient. Study date: Study date: 04/21/2014. Study time: 09:55 AM. Location: [facility]  -------------------------------------------------------------------  ------------------------------------------------------------------- Left ventricle: The cavity size was normal. Systolic function was normal. The estimated ejection fraction was in the range of 55% to 65%. Doppler parameters are consistent with abnormal left ventricular relaxation (grade 1 diastolic dysfunction).  ------------------------------------------------------------------- Aortic valve: AV is thckened, calciifed with restricted motion. Peak and mean gradients through the valve are 51 and 31 mm Hg consiste with moderate AS. Doppler: There was mild  regurgitation.  VTI ratio of LVOT to aortic valve: 0.28. Valve area (VTI): 1.27 cm^2. Indexed valve area (VTI): 0.52 cm^2/m^2. Mean velocity ratio of LVOT to aortic valve: 0.25. Valve area (Vmean): 1.12 cm^2. Indexed valve area (Vmean): 0.46 cm^2/m^2.  Mean gradient (S): 32 mm Hg. Peak gradient (S): 51 mm Hg.  ------------------------------------------------------------------- Aorta: Mild dilation of proximal ascending aorta (42 mm)  ------------------------------------------------------------------- Mitral valve:  Structurally normal valve.  Leaflet separation was normal. Doppler: Transvalvular velocity was within the normal range. There was no evidence for stenosis. There was no regurgitation.  ------------------------------------------------------------------- Left  atrium: The atrium was mildly dilated.  ------------------------------------------------------------------- Right ventricle: The cavity size was normal. Wall thickness was normal. Systolic function was normal.  ------------------------------------------------------------------- Pulmonic valve:  Structurally normal valve.  Cusp separation was normal. Doppler: Transvalvular velocity was within the normal range. There was no regurgitation.  ------------------------------------------------------------------- Tricuspid valve:  Structurally normal valve.  Leaflet separation was normal. Doppler: Transvalvular velocity was within the normal range. There was mild regurgitation.  ------------------------------------------------------------------- Right atrium: The atrium was normal in size.  ------------------------------------------------------------------- Pericardium: There was no pericardial effusion.  ------------------------------------------------------------------- Post procedure conclusions Ascending Aorta:  - Mild dilation of proximal ascending aorta (42  mm)  ------------------------------------------------------------------- Measurements  Left ventricle              Value     Reference LV ID, ED, PLAX chordal      (L)   34  mm    43 - 52 LV ID, ES, PLAX chordal          23  mm    23 - 38 LV fx shortening, PLAX chordal      32  %    >=29 LV PW thickness, ED            11  mm    --------- IVS/LV PW ratio, ED            1.18      <=1.3 Stroke volume, 2D             115  ml    --------- Stroke volume/bsa, 2D           47  ml/m^2  --------- LV e&', lateral              10.1 cm/s   --------- LV E/e&', lateral             6.05      --------- LV e&', medial               4.57 cm/s   --------- LV E/e&', medial              13.37     --------- LV e&', average              7.34 cm/s   --------- LV E/e&', average             8.33      ---------  Ventricular septum            Value     Reference IVS thickness, ED             13  mm    ---------  LVOT                   Value     Reference LVOT ID, S                24  mm    --------- LVOT area                 4.52 cm^2   --------- LVOT ID                  24  mm    --------- LVOT mean velocity, S           66.3 cm/s   --------- LVOT VTI, S                25.4 cm    --------- Stroke volume (SV), LVOT DP  114.9 ml    --------- Stroke index (SV/bsa), LVOT DP      47.1 ml/m^2  ---------  Aortic valve               Value     Reference Aortic valve peak velocity, S       357  cm/s   --------- Aortic valve mean velocity, S       267  cm/s    --------- Aortic valve VTI, S            90.1 cm    --------- Aortic mean gradient, S          32  mm Hg  --------- Aortic peak gradient, S          51  mm Hg  --------- VTI ratio, LVOT/AV            0.28      --------- Aortic valve area, VTI          1.27 cm^2   --------- Aortic valve area/bsa, VTI        0.52 cm^2/m^2 --------- Velocity ratio, mean, LVOT/AV       0.25      --------- Aortic valve area, mean velocity     1.12 cm^2   --------- Aortic valve area/bsa, mean        0.46 cm^2/m^2 --------- velocity  Aorta                   Value     Reference Aortic root ID, ED            32  mm    --------- Ascending aorta ID, A-P, S        42  mm    ---------  Left atrium                Value     Reference LA ID, A-P, ES              44.96 mm    --------- LA ID/bsa, A-P              1.84 cm/m^2  <=2.2 LA volume, S               59.7 ml    --------- LA volume/bsa, S             24.5 ml/m^2  --------- LA volume, ES, 1-p A4C          42.1 ml    --------- LA volume/bsa, ES, 1-p A4C        17.2 ml/m^2  --------- LA volume, ES, 1-p A2C          78.1 ml    --------- LA volume/bsa, ES, 1-p A2C        32  ml/m^2  ---------  Mitral valve               Value     Reference Mitral E-wave peak velocity        61.1 cm/s   --------- Mitral deceleration time     (H)   264  ms    150 - 230 Mitral E/A ratio, peak          0.7      ---------  Pulmonary arteries            Value     Reference PA pressure, S, DP            29  mm Hg  <=30  Tricuspid valve              Value      Reference Tricuspid regurg peak velocity      255  cm/s   --------- Tricuspid peak RV-RA gradient       26  mm Hg  ---------  Systemic veins              Value     Reference Estimated CVP               3   mm Hg  ---------  Right ventricle              Value     Reference RV pressure, S, DP            29  mm Hg  <=30 RV s&', lateral, S             13.8 cm/s   ---------  Legend: (L) and (H) mark values outside specified reference range.  ------------------------------------------------------------------- Prepared and Electronically Authenticated by  Dorris Carnes, M.D. 2016-03-11T14:42:28    [name], MD (Primary)      Procedures    Right/Left Heart Cath and Coronary Angiography    Conclusion     Mid RCA lesion, 25% stenosed.  The left ventricular systolic function is normal.  Normal LV function with an ejection fraction of 60-65%.  Severely calcified aortic valve with reduced excursion, and evidence for severe aortic valve stenosis with a peak to peak gradient of 56, a mean gradient of 49.3, and a calculated valve area of 0.69 cm.  Dilated aortic root.  Mild nonobstructive CAD with widely patent stents in the proximal and mid LAD; patent stent in the ramus intermediate vessel; normal left circumflex coronary artery; and dominant RCA with 20-30% mid nonobstructive stenosis.  RECOMMENDATION: The patient will require aortic valve replacement surgery. Dr. Radford Pax was notified. Surgical consultation will be obtained as an outpatient for consideration of TAVR versus open procedure.     Indications    Aortic stenosis [I35.0 (ICD-10-CM)]    Technique and Indications    Mr. Aitor Orsburn is an 82 year old patient of Dr. Radford Pax who has a history of previously documented at least moderate aortic stenosis. He has undergone prior stenting to his LAD  and high marginal/ramus intermediate vessel. He recently has developed progressive increasing shortness of breath with minimal activity. He is referred for right left heart catheterization.  The patient was brought to the second floor [facility] cath lab in the fasting state. Versed 2 mg and fentanyl 25 mcg were administered for conscious sedation. The right groin was prepped and draped in sterile fashion and a 5 [place] arterial sheath and 7 French venous sheath were inserted without difficulty. A Swan-Ganz catheter was advanced into the venous sheath and pressures were obtained in the right atrium, right ventricle, pulmonary artery, and pulmonary capillary wedge position. Cardiac outputs were obtained by the thermodilution and assumed Fick methods. Oxygen saturation was obtained in the pulmonary artery and aorta. A pigtail catheter was inserted and simultaneous AO/PA pressures were recorded. The pigtail catheter was advanced into the left ventricle and simultaneous left ventricular and PCW pressures were recorded. Left ventriculography was performed in the RAO projection. A left ventricle to aorta pullback was performed. The pigtail catheter was then removed and diagnostic catheterization to delineate the coronary anatomy was performed utilizing 5 French Judkins 4 left and right diagnostic catheters. All catheters were  removed and the patient. Hemostasis was obtained by direct manual pressure. The patient tolerated the procedure well and returned to his room in satisfactory condition.    During this procedure the patient was administered a total of Versed 2 mg and Fentanyl 25 mg to achieve and maintain moderate conscious sedation. The patient's heart rate, blood pressure, and oxygen saturation are monitored continuously during the procedure. The period of conscious sedation is 60 minutes, of which I was present face-to-face 100% of this time. : Estimated blood loss <50 mL. There were no immediate  complications during the procedure.    Coronary Findings    Dominance: Right   Left Anterior Descending   . Mid LAD lesion, 0% stenosed. Previously placed Mid LAD stent (unknown type) is patent.   Jorene Minors LAD lesion, 0% stenosed. Previously placed Dist LAD stent (unknown type) is patent.     Ramus Intermedius   . Ramus lesion, 0% stenosed. Previously placed Ramus stent (unknown type) is patent.     Right Coronary Artery   . Mid RCA lesion, 25% stenosed.       Right Heart Pressures Hemodynamic findings consistent with mild pulmonary hypertension. Elevated LV EDP consistent with volume overload. RA: a 15 mean 13 RV: 28/10 PA: 32/11 PCWP: mean 18  LV: 198/17 PC: 15  Pullback: LV: 205/19/27 AO: 158/80    Wall Motion                 Left Heart    Left Ventricle The left ventricular size is normal. The left ventricular systolic function is normal. The left ventricular ejection fraction is 55-65% by visual estimate. There are no wall motion abnormalities in the left ventricle.   Aortic Valve There is severe aortic valve stenosis, and no aortic valve regurgitation. The aortic valve is calcified. There is restricted aortic valve motion.   Aorta The ascending aorta is dilated.    Coronary Diagrams    Diagnostic Diagram            Implants     No implant documentation for this case.    PACS Images    Show images for Cardiac catheterization     Link to Procedure Log    Procedure Log      Hemo Data       Most Recent Value   Fick Cardiac Output  5.74 L/min   Fick Cardiac Output Index  2.4 (L/min)/BSA   Thermal Cardiac Output  5.34 L/min   Thermal Cardiac Output Index  2.23 (L/min)/BSA   Aortic Mean Gradient  49.3 mmHg   Aortic Peak Gradient  56 mmHg   Aortic Valve Area  0.69   Aortic Value Area Index  0.29 cm2/BSA   Mitral Mean Gradient  1.3 mmHg   Mitral Peak Gradient  0 mmHg   Mitral Valve Area Index  1.46 cm2/BSA   RA  A Wave  18 mmHg   RA V Wave  17 mmHg   RA Mean  17 mmHg   RV Systolic Pressure  28 mmHg   RV Diastolic Pressure  7 mmHg   RV EDP  10 mmHg   PA Systolic Pressure  32 mmHg   PA Diastolic Pressure  11 mmHg   PA Mean  17 mmHg   PW A Wave  15 mmHg   PW V Wave  17 mmHg   PW Mean  15 mmHg   AO Systolic Pressure  0000000 mmHg   AO Diastolic Pressure  80 mmHg  AO Mean  99991111 mmHg   LV Systolic Pressure  99991111 mmHg   LV Diastolic Pressure  14 mmHg   LV EDP  17 mmHg   Arterial Occlusion Pressure Extended Systolic Pressure  123456 mmHg   Arterial Occlusion Pressure Extended Diastolic Pressure  77 mmHg   Arterial Occlusion Pressure Extended Mean Pressure  104 mmHg   Left Ventricular Apex Extended Systolic Pressure  99991111 mmHg   Left Ventricular Apex Extended Diastolic Pressure  19 mmHg   Left Ventricular Apex Extended EDP Pressure  27 mmHg   TPVR Index  13.88 HRUI   TSVR Index  46.55 HRUI   PVR SVR Ratio  0.14   TPVR/TSVR Ratio  0.3    Result status: Final result      *[place]*  *[facility]*  [address], [place] [address]  [phone]  ------------------------------------------------------------------- Echocardiography  Patient: Taurino, Figeroa MR #: [id number] Study Date: 05/09/2015 Gender: M Age: 22 Height: 188 cm Weight: 114.8 kg BSA: 2.48 m^2 Pt. Status: Room:  ATTENDING Darylene Price, M.D. ORDERING Darylene Price, M.D. PERFORMING [facility], Outpatient SONOGRAPHER Darlina Sicilian, RDCS  cc:  -------------------------------------------------------------------  ------------------------------------------------------------------- Indications: Aortic stenosis 424.1.  ------------------------------------------------------------------- History: PMH:  Dyspnea. Coronary artery disease. Risk factors: Hypertension.  ------------------------------------------------------------------- Study Conclusions  - Left ventricle: The cavity size was normal. Wall thickness was  increased in a pattern of moderate LVH. There was focal basal  hypertrophy. Doppler parameters are consistent with abnormal left  ventricular relaxation (grade 1 diastolic dysfunction). - Aortic valve: There was moderate stenosis. There was mild  regurgitation.  Echocardiography. M-mode, complete 2D, spectral Doppler, and color Doppler. Birthdate: Patient birthdate: 03/19/1933. Age: Patient is 82 yr old. Sex: Gender: male. BMI: 32.5 kg/m^2. Blood pressure: 146/72 Patient status: Inpatient. Study date: Study date: 05/09/2015. Study time: 01:52 PM. Location: Echo laboratory.  -------------------------------------------------------------------  ------------------------------------------------------------------- Left ventricle: The cavity size was normal. Wall thickness was increased in a pattern of moderate LVH. There was focal basal hypertrophy. Doppler parameters are consistent with abnormal left ventricular relaxation (grade 1 diastolic dysfunction).  ------------------------------------------------------------------- Aortic valve: Moderately thickened, moderately calcified leaflets. Doppler: There was moderate stenosis. There was mild regurgitation. VTI ratio of LVOT to aortic valve: 0.34. Peak velocity ratio of LVOT to aortic valve: 0.25. Mean velocity ratio of LVOT to aortic valve: 0.28. Mean gradient (S): 25 mm Hg. Peak gradient (S): 47 mm Hg.  ------------------------------------------------------------------- Aorta: Aortic root: The aortic root was normal in size. Ascending aorta: The ascending aorta was normal in size.  ------------------------------------------------------------------- Mitral valve:  Structurally normal valve. Leaflet separation was normal. Doppler: Transvalvular velocity was within the normal range. There was no evidence for stenosis. There was no regurgitation.  ------------------------------------------------------------------- Right ventricle: The cavity size was normal. Systolic function was normal.  ------------------------------------------------------------------- Tricuspid valve: Structurally normal valve. Leaflet separation was normal. Doppler: Transvalvular velocity was within the normal range. There was mild regurgitation.  ------------------------------------------------------------------- Pulmonary artery: Systolic pressure was within the normal range.  ------------------------------------------------------------------- Right atrium: The atrium was normal in size.  ------------------------------------------------------------------- Pericardium: There was no pericardial effusion.  ------------------------------------------------------------------- Measurements  Left ventricle Value Reference LV ID, ED, PLAX chordal (L) 40.9 mm 43 - 52 LV ID, ES, PLAX chordal 32 mm 23 - 38 LV fx shortening, PLAX chordal (L) 22 % >=29 LV PW thickness, ED 12.4 mm --------- IVS/LV PW ratio, ED (H) 1.4 <=1.3 LV e&', lateral 6.53 cm/s --------- LV E/e&', lateral 7.35 --------- LV e&', medial 4.03 cm/s --------- LV  E/e&', medial 11.91 --------- LV e&', average 5.28 cm/s --------- LV E/e&', average 9.09 ---------  Ventricular septum Value Reference IVS thickness,  ED 17.4 mm ---------  LVOT Value Reference LVOT peak velocity, S 86.8 cm/s --------- LVOT mean velocity, S 65.2 cm/s --------- LVOT VTI, S 24.3 cm ---------  Aortic valve Value Reference Aortic valve peak velocity, S 344 cm/s --------- Aortic valve mean velocity, S 234 cm/s --------- Aortic valve VTI, S 71 cm --------- Aortic mean gradient, S 25 mm Hg --------- Aortic peak gradient, S 47 mm Hg --------- VTI ratio, LVOT/AV 0.34 --------- Velocity ratio, peak, LVOT/AV 0.25 --------- Velocity ratio, mean, LVOT/AV 0.28 --------- Aortic regurg pressure half-time 569 ms ---------  Aorta Value Reference Aortic root ID, ED 33 mm ---------  Left atrium Value Reference LA ID, A-P, ES 43 mm --------- LA ID/bsa, A-P 1.73 cm/m^2 <=2.2 LA volume, ES, 1-p A4C 65.3 ml --------- LA volume/bsa, ES, 1-p A4C 26.3 ml/m^2 --------- LA volume, ES, 1-p A2C 27.7 ml --------- LA volume/bsa, ES, 1-p A2C 11.2 ml/m^2 ---------  Mitral valve Value Reference Mitral E-wave peak velocity 48 cm/s --------- Mitral A-wave peak velocity 90 cm/s --------- Mitral deceleration time (H) 356 ms 150 - 230 Mitral E/A ratio, peak 0.5 ---------  Pulmonary  arteries Value Reference PA pressure, S, DP 22 mm Hg <=30  Tricuspid valve Value Reference Tricuspid regurg peak velocity 219 cm/s --------- Tricuspid peak RV-RA gradient 19 mm Hg ---------  Systemic veins Value Reference Estimated CVP 3 mm Hg ---------  Right ventricle Value Reference TAPSE 20.1 mm --------- RV pressure, S, DP 22 mm Hg <=30 RV s&', lateral, S 14.4 cm/s ---------  Legend: (L) and (H) mark values outside specified reference range.  ------------------------------------------------------------------- Prepared and Electronically Authenticated by  Mertie Moores, M.D. 2017-03-29T17:20:29   ADDENDUM REPORT: 05/09/2015 12:55 CLINICAL DATA: Aortic stenosis EXAM: Cardiac TAVR CT TECHNIQUE: The patient was scanned on a Philips 256 scanner. A 120 kV retrospective scan was triggered in the descending thoracic aorta at 111 HU's. Gantry rotation speed was 270 msecs and collimation was .9 mm. No beta blockade or nitro were given. The 3D data set was reconstructed in 5% intervals of the R-R cycle. Systolic and diastolic phases were analyzed on a dedicated work station using MPR, MIP and VRT modes. The patient received 80 cc of contrast. FINDINGS: Aortic Valve: Tri-leaflet with heavy calcification of all 3 leaflets. No significant calcium in annulus Aorta: No aneurysmal dilatation minimal calcific plaque in arch normal take off of arch vessels Sinotubular Junction: 30 mm Ascending Thoracic Aorta: 36 mm Aortic Arch: 30 mm Descending Thoracic Aorta: 28 mm Sinus of Valsalva Measurements: Non-coronary: 33.5 mm Right -coronary: 32 mm Left  -coronary: 32.6 mm Coronary Artery Height above Annulus: Left Main: 15 mm Right Coronary: 18 mm Virtual Basal Annulus Measurements: Maximum/Minimum Diameter: 30 mm x 23.4 mm Perimeter: 88 mm Area: 605 mm2 Coronary Arteries: Sufficient height for deployment Optimum Fluoroscopic Angle for Delivery: LAO 11 degrees Cranial 0 degrees ? Filling defect in LAA IMPRESSION: 1) Calcified tri-leaflet aortic valve Annular area 605 mm2 suitable for 29 mm Sapien 3 valve 2) Coronary arteries suitable height for deployment 3) Normal aortic root and arch 4) Optimum angiographic angle for deployment LAO 11 degrees Cranial 0 degrees 5) Cannot r/o filling defect in LAA Patient has no history of PAF Should interrogate during intraop TEE [place] Electronically Signed  By: Jenkins Rouge M.D.  On: 05/09/2015 12:55     Study Result  EXAM: OVER-READ INTERPRETATION CT CHEST  The following report is an over-read performed by radiologist Dr. Rebekah [facility] on 05/09/2015. This over-read does not include interpretation of cardiac or coronary anatomy or pathology. The coronary calcium score/coronary CTA interpretation by the cardiologist is attached.  COMPARISON: No priors.  FINDINGS: Extracardiac findings will be described under separate dictation for contemporaneously obtained CTA chest, abdomen and pelvis dated 05/09/2015.  IMPRESSION: Please see separate dictation for contemporaneously obtained CTA of the chest, abdomen and pelvis 05/09/2015 for full description of extracardiac findings.  Electronically Signed: By: Vinnie Langton M.D. On: 05/09/2015 12:42   CLINICAL DATA: 82 year old male with history of severe aortic stenosis and shortness of breath worsening for the past 2 years. Preprocedural study prior to potential transcatheter aortic valve replacement (TAVR).  EXAM: CT ANGIOGRAPHY CHEST, ABDOMEN AND  PELVIS  TECHNIQUE: Multidetector CT imaging through the chest, abdomen and pelvis was performed using the standard protocol during bolus administration of intravenous contrast. Multiplanar reconstructed images and MIPs were obtained and reviewed to evaluate the vascular anatomy.  CONTRAST: 72mL OMNIPAQUE IOHEXOL 350 MG/ML SOLN  COMPARISON: No prior chest CT. CT the abdomen and pelvis 02/08/2013.  FINDINGS: CTA CHEST FINDINGS  Mediastinum/Lymph Nodes: Heart size is normal. There is no significant pericardial fluid, thickening or pericardial calcification. There is atherosclerosis of the thoracic aorta, the great vessels of the mediastinum and the coronary arteries, including calcified atherosclerotic plaque in the left main, left anterior descending left circumflex and right coronary arteries. Severe calcifications of the aortic valve. No pathologically enlarged mediastinal or hilar lymph nodes. Small hiatal hernia. No axillary lymphadenopathy.  Lungs/Pleura: A few scattered tiny 1-3 mm pulmonary nodules are noted throughout the lungs bilaterally, statistically likely benign. No larger more suspicious appearing pulmonary nodules or masses. No acute consolidative airspace disease. No pleural effusions. Small calcified pleural plaque in the anterior aspect of the left hemithorax. No right-sided pleural plaques identified.  Musculoskeletal/Soft Tissues: There are no aggressive appearing lytic or blastic lesions noted in the visualized portions of the skeleton.  CTA ABDOMEN AND PELVIS FINDINGS  Hepatobiliary: Diffuse low attenuation throughout the hepatic parenchyma, compatible with hepatic steatosis. No cystic or solid hepatic lesions are noted. No intra or extrahepatic biliary ductal dilatation. Gallbladder is normal in appearance.  Pancreas: No pancreatic mass. No pancreatic ductal dilatation. No pancreatic or peripancreatic fluid or inflammatory  changes.  Spleen: Unremarkable.  Adrenals/Urinary Tract: Small bilateral adrenal gland nodules are again noted, similar to remote prior study from 02/08/2013 at which point these were classified as adenomas. These measure 1.5 cm on the right and 1.4 cm on the left. Multiple parapelvic cysts are noted in the kidneys bilaterally. No suspicious renal lesions. No hydroureteronephrosis. Mild thickening and irregularity of the urinary bladder wall, including multiple small bladder diverticulae.  Stomach/Bowel: The appearance of the stomach is normal. There is no pathologic dilatation of small bowel or colon. Normal appendix.  Vascular/Lymphatic: Atherosclerosis throughout the abdominal vasculature with vascular findings and measurements pertinent to potential TAVR procedure, as detailed. No aneurysm or dissection is noted. Single renal arteries bilaterally. Celiac axis, superior mesenteric artery and inferior mesenteric artery and major branches are all widely patent. No lymphadenopathy identified in the abdomen or pelvis.  Reproductive: Prostate gland is enlarged and heterogeneous in appearance measuring up to 4.5 x 5.9 x 6.8 cm. Seminal vesicles are unremarkable in appearance.  Other: No significant volume of ascites. No pneumoperitoneum.  Musculoskeletal: There are no aggressive appearing lytic or blastic lesions noted in  the visualized portions of the skeleton.  VASCULAR MEASUREMENTS PERTINENT TO TAVR:  AORTA:  Minimal Aortic Diameter - 17 x 17 mm  Severity of Aortic Calcification - mild  RIGHT PELVIS:  Right Common Iliac Artery -  Minimal Diameter - 10.6 x 9.9 mm  Tortuosity - mild  Calcification - mild  Right External Iliac Artery -  Minimal Diameter - 7.5 x 8.8 mm  Tortuosity - mild  Calcification - none  Right Common Femoral Artery -  Minimal Diameter - 8.0 x 8.5 mm  Tortuosity - mild  Calcification - mild  LEFT  PELVIS:  Left Common Iliac Artery -  Minimal Diameter - 11.7 x 10.2 mm  Tortuosity - mild  Calcification - mild  Left External Iliac Artery -  Minimal Diameter - 9.2 x 9.1 mm  Tortuosity - mild  Calcification - minimal  Left Common Femoral Artery -  Minimal Diameter - 8.6 x 6.8 mm  Tortuosity - mild  Calcification - mild  Review of the MIP images confirms the above findings.  IMPRESSION: 1. Vascular findings and measurements pertinent to potential T AVR procedure, as detailed above. This patient does appear to have suitable pelvic arterial access bilaterally. 2. Severely calcified and thickened aortic valve, compatible with the reported clinical history of severe aortic stenosis. 3. Atherosclerosis, including left main and 3 vessel coronary artery disease. 4. Hepatic steatosis. 5. Bilateral adrenal adenomas redemonstrated. 6. Markedly enlarged heterogeneous appearing prostate gland, presumably from benign prostatic hypertrophy. This is associated with some thickening and irregularity of the urinary bladder wall with multiple small diverticulae, suggesting bladder outlet obstruction. 7. Additional incidental findings, as above.   Electronically Signed  By: Vinnie Langton M.D.  On: 05/09/2015 13:10  Impression:  He has stage D severe symptomatic aortic stenosis with progressive symptoms of exertional shortness of breath and fatigue with chronic diastolic heart failure that is now NYHA class III. I have personally reviewed the patient's transthoracic echocardiogram from 04/21/2014. At that time the patient had at least moderate aortic stenosis with severe thickening, calcification, and restricted leaflet mobility involving all 3 leaflets of the aortic valve. Peak velocity across the aortic valve was 3.6 m/s corresponding to mean transvalvular gradient of 32 mmHg. The dimensionless velocity index was 0.25.His recent cath confirms the presence of  severe aortic stenosis with peak to peak and mean transvalvular gradients measured at 56 and 49 mmHg respectively corresponding to aortic valve area calculated 0.69 cm.His stents are patent and there is no obstructive coronary disease. He has undergone pulmonary evaluation last year showing no significant pulmonary disease. His most recent echo only shows a mean AV gradient of 25 mm Hg with a normal EF which I can not explain. There valve looks severe calcified and narrowed. This is probably a measurement error. I think TAVR is the best treatment for this 82 year old gentleman with an STS PROM of 4.2%. I have personally reviewed his CT scans which show that he would be a suitable candidate for a Sapien 3 valve via a transfemoral route. There is a question of a small filling defect in the left atrial appendage which could be a small thrombus although he has no history of atrial fibrillation. This will need to be reviewed with the team.    Plan:  We will review the cardiac CT with the team to decide if any further workup of the LAA filling defect is warranted. He and his wife would like to proceed with TAVR as quickly as possible since  he is so symptomatic.    Gaye Pollack, MD 05/10/2015

## 2015-05-14 ENCOUNTER — Telehealth: Payer: Self-pay | Admitting: Cardiology

## 2015-05-14 ENCOUNTER — Telehealth: Payer: Self-pay

## 2015-05-14 ENCOUNTER — Other Ambulatory Visit: Payer: Self-pay | Admitting: *Deleted

## 2015-05-14 MED ORDER — CLOPIDOGREL BISULFATE 75 MG PO TABS: 75.0000 mg | ORAL_TABLET | Freq: Every day | ORAL | Status: AC

## 2015-05-14 NOTE — Telephone Encounter (Signed)
-----   Message from Sueanne Margarita, MD sent at 05/11/2015  6:25 PM EDT ----- Please take care of this [name] ----- Message -----    From: Laury Deep, RN    Sent: 05/11/2015   4:19 PM      To: Sueanne Margarita, MD, Theodoro Parma, RN  Dr. Radford Pax,   I have scheduled this patient for TAVR surgery on Tuesday 4/18 and when I was going over everything with Banks about when to stop his plavix he said he was all out of it.  Do you mind having your nurse order more for Banks?  We are going to stop it 5-7 days preop but I told Banks once I hear back from Dr. Roxy Manns on that I will let Banks know the official date to stop.  Thanks for your help,  Levonne Spiller, RN

## 2015-05-14 NOTE — Telephone Encounter (Signed)
Refill for Plavix was sent in today by refill team.

## 2015-05-14 NOTE — Telephone Encounter (Signed)
°*  STAT* If patient is at the pharmacy, call can be transferred to refill team.   1. Which medications need to be refilled? (please list name of each medication and dose if known) Plavix   2. Which pharmacy/location (including street and city if local pharmacy) is medication to be sent to?[facility] [place]   3. Do they need a 30 day or 90 day supply?[place]

## 2015-05-18 ENCOUNTER — Telehealth: Payer: Self-pay | Admitting: Cardiovascular Disease

## 2015-05-18 NOTE — Telephone Encounter (Signed)
New message   [facility] care management called request to have a message sent to Dr. Angelena Form event though it is Dr. Landis Gandy pt. She states that they will need to complete a peer to peer to discuss if and why the procedure that is currently on 05/25/2015 is needed. Please call back to discuss as soon as possible

## 2015-05-25 ENCOUNTER — Ambulatory Visit (HOSPITAL_COMMUNITY)
Admission: RE | Admit: 2015-05-25 | Discharge: 2015-05-25 | Disposition: A | Discharge status: Home / Self Care | Payer: Commercial Managed Care - HMO | Source: Ambulatory Visit | Attending: Cardiovascular Disease | Admitting: Cardiovascular Disease

## 2015-05-25 ENCOUNTER — Encounter (HOSPITAL_COMMUNITY): Payer: Self-pay

## 2015-05-25 ENCOUNTER — Encounter (HOSPITAL_COMMUNITY)
Admission: RE | Admit: 2015-05-25 | Discharge: 2015-05-25 | Disposition: A | Discharge status: Home / Self Care | Payer: Commercial Managed Care - HMO | Source: Ambulatory Visit | Attending: Cardiovascular Disease | Admitting: Cardiovascular Disease

## 2015-05-25 VITALS — BP 140/77 | HR 59 | Temp 97.6°F | Resp 20 | Ht 74.0 in | Wt 254.4 lb

## 2015-05-25 DIAGNOSIS — Z01812 Encounter for preprocedural laboratory examination: Secondary | ICD-10-CM | POA: Diagnosis not present

## 2015-05-25 DIAGNOSIS — R918 Other nonspecific abnormal finding of lung field: Secondary | ICD-10-CM | POA: Diagnosis not present

## 2015-05-25 DIAGNOSIS — Z01818 Encounter for other preprocedural examination: Secondary | ICD-10-CM | POA: Diagnosis not present

## 2015-05-25 DIAGNOSIS — I350 Nonrheumatic aortic (valve) stenosis: Secondary | ICD-10-CM

## 2015-05-25 DIAGNOSIS — J9811 Atelectasis: Secondary | ICD-10-CM | POA: Diagnosis not present

## 2015-05-25 DIAGNOSIS — Z01810 Encounter for preprocedural cardiovascular examination: Secondary | ICD-10-CM | POA: Diagnosis not present

## 2015-05-25 DIAGNOSIS — R001 Bradycardia, unspecified: Secondary | ICD-10-CM | POA: Diagnosis not present

## 2015-05-25 DIAGNOSIS — I517 Cardiomegaly: Secondary | ICD-10-CM | POA: Diagnosis not present

## 2015-05-25 HISTORY — DX: Unspecified urinary incontinence: R32

## 2015-05-25 LAB — URINALYSIS, ROUTINE W REFLEX MICROSCOPIC
GLUCOSE, UA: NEGATIVE mg/dL
Hgb urine dipstick: NEGATIVE
KETONES UR: NEGATIVE mg/dL
Leukocytes, UA: NEGATIVE
Nitrite: NEGATIVE
PH: 6.5 (ref 5.0–8.0)
Protein, ur: NEGATIVE mg/dL
Specific Gravity, Urine: 1.028 (ref 1.005–1.030)

## 2015-05-25 LAB — CBC
HEMATOCRIT: 43.4 % (ref 39.0–52.0)
Hemoglobin: 15.5 g/dL (ref 13.0–17.0)
MCH: 32.4 pg (ref 26.0–34.0)
MCHC: 35.7 g/dL (ref 30.0–36.0)
MCV: 90.6 fL (ref 78.0–100.0)
Platelets: 179 10*3/uL (ref 150–400)
RBC: 4.79 MIL/uL (ref 4.22–5.81)
RDW: 13.9 % (ref 11.5–15.5)
WBC: 4.6 10*3/uL (ref 4.0–10.5)

## 2015-05-25 LAB — COMPREHENSIVE METABOLIC PANEL
ALT: 26 U/L (ref 17–63)
AST: 24 U/L (ref 15–41)
Albumin: 3.7 g/dL (ref 3.5–5.0)
Alkaline Phosphatase: 79 U/L (ref 38–126)
Anion gap: 11 (ref 5–15)
BILIRUBIN TOTAL: 1.5 mg/dL — AB (ref 0.3–1.2)
BUN: 16 mg/dL (ref 6–20)
CO2: 19 mmol/L — ABNORMAL LOW (ref 22–32)
CREATININE: 0.82 mg/dL (ref 0.61–1.24)
Calcium: 9 mg/dL (ref 8.9–10.3)
Chloride: 111 mmol/L (ref 101–111)
Glucose, Bld: 112 mg/dL — ABNORMAL HIGH (ref 65–99)
POTASSIUM: 3.9 mmol/L (ref 3.5–5.1)
Sodium: 141 mmol/L (ref 135–145)
TOTAL PROTEIN: 6.4 g/dL — AB (ref 6.5–8.1)

## 2015-05-25 LAB — SURGICAL PCR SCREEN
MRSA, PCR: NEGATIVE
Staphylococcus aureus: NEGATIVE

## 2015-05-25 LAB — TYPE AND SCREEN
ABO/RH(D): O POS
ANTIBODY SCREEN: NEGATIVE

## 2015-05-25 LAB — BLOOD GAS, ARTERIAL
Acid-base deficit: 1.8 mmol/L (ref 0.0–2.0)
BICARBONATE: 22.2 meq/L (ref 20.0–24.0)
DRAWN BY: 449841
FIO2: 0.21
O2 Saturation: 96.5 %
PCO2 ART: 36.3 mmHg (ref 35.0–45.0)
PH ART: 7.404 (ref 7.350–7.450)
PO2 ART: 87.8 mmHg (ref 80.0–100.0)
Patient temperature: 98.6
TCO2: 23.3 mmol/L (ref 0–100)

## 2015-05-25 LAB — ABO/RH: ABO/RH(D): O POS

## 2015-05-25 LAB — PROTIME-INR
INR: 1.13 (ref 0.00–1.49)
PROTHROMBIN TIME: 14.7 s (ref 11.6–15.2)

## 2015-05-25 LAB — APTT: aPTT: 30 seconds (ref 24–37)

## 2015-05-25 NOTE — Progress Notes (Signed)
   05/25/15 1014  [place]  Have you ever been diagnosed with sleep apnea through a sleep study? No  Do you snore loudly (loud enough to be heard through closed doors)?  0  Do you often feel tired, fatigued, or sleepy during the daytime (such as falling asleep during driving or talking to someone)? 1  Has anyone observed you stop breathing during your sleep? 0  Do you have, or are you being treated for high blood pressure? 1  BMI more than 35 kg/m2? 0  Age > 50 (1-yes) 1  Neck circumference greater than:Male 16 inches or larger, Male 17inches or larger? 1 (14)  Male Gender (Yes=1) 1  Obstructive Sleep Apnea Score 5  Score 5 or greater  Results sent to PCP

## 2015-05-25 NOTE — Pre-Procedure Instructions (Signed)
    [name]  05/25/2015     Your procedure is scheduled on Tues, April 18 @ 7:30 AM  Report to [facility] Admitting at 5:30 AM  Call this number if you have problems the morning of surgery:  [phone]   Remember:  Do not eat food or drink liquids after midnight.  Take these medicines the morning of surgery with A SIP OF WATER Amlodipine(Norvasc),Proscar(Finasteride),Omeprazole(Prilosec),and Zoloft(Sertraline)             No Goody's,BC's,Aleve,Advil,Motrin,Ibuprofen,Fish Oil,or any Herbal Medications.           Do not wear jewelry.  Do not wear lotions, powders, or colognes.               Men may shave face and neck.  Do not bring valuables to the hospital.  [facility] is not responsible for any belongings or valuables.  Contacts, dentures or bridgework may not be worn into surgery.  Leave your suitcase in the car.  After surgery it may be brought to your room.  For patients admitted to the hospital, discharge time will be determined by your treatment team.  Patients discharged the day of surgery will not be allowed to drive home.   Please read over the following fact sheets that you were given. Pain Booklet, Coughing and Deep Breathing, Blood Transfusion Information, MRSA Information and Surgical Site Infection Prevention

## 2015-05-26 LAB — HEMOGLOBIN A1C
HEMOGLOBIN A1C: 5.4 % (ref 4.8–5.6)
Mean Plasma Glucose: 108 mg/dL

## 2015-05-28 ENCOUNTER — Encounter (HOSPITAL_COMMUNITY): Payer: Self-pay | Admitting: Thoracic Surgery (Cardiothoracic Vascular Surgery)

## 2015-05-28 MED ORDER — HEPARIN SODIUM (PORCINE) 1000 UNIT/ML IJ SOLN
INTRAMUSCULAR | Status: DC
  Filled 2015-05-28: qty 30

## 2015-05-28 MED ORDER — CHLORHEXIDINE GLUCONATE 0.12 % MT SOLN
15.0000 mL | Freq: Once | OROMUCOSAL | Status: AC
  Administered 2015-05-29: 15 mL via OROMUCOSAL
  Filled 2015-05-28: qty 15

## 2015-05-28 MED ORDER — PHENYLEPHRINE HCL 10 MG/ML IJ SOLN
30.0000 ug/min | INTRAVENOUS | Status: DC
  Filled 2015-05-28: qty 2

## 2015-05-28 MED ORDER — DEXTROSE 5 % IV SOLN
1.5000 g | INTRAVENOUS | Status: AC
  Administered 2015-05-29: 1.5 g via INTRAVENOUS
  Filled 2015-05-28 (×2): qty 1.5

## 2015-05-28 MED ORDER — POTASSIUM CHLORIDE 2 MEQ/ML IV SOLN
80.0000 meq | INTRAVENOUS | Status: DC
  Filled 2015-05-28: qty 40

## 2015-05-28 MED ORDER — NOREPINEPHRINE BITARTRATE 1 MG/ML IV SOLN
0.0000 ug/min | INTRAVENOUS | Status: AC
  Administered 2015-05-29: 2 ug/min via INTRAVENOUS
  Filled 2015-05-28: qty 4

## 2015-05-28 MED ORDER — CHLORHEXIDINE GLUCONATE 4 % EX LIQD
30.0000 mL | CUTANEOUS | Status: DC
Start: 2015-05-28 — End: 2015-05-29

## 2015-05-28 MED ORDER — DEXMEDETOMIDINE HCL IN NACL 400 MCG/100ML IV SOLN
0.1000 ug/kg/h | INTRAVENOUS | Status: AC
  Administered 2015-05-29: .4 ug/kg/h via INTRAVENOUS
  Filled 2015-05-28: qty 100

## 2015-05-28 MED ORDER — MAGNESIUM SULFATE 50 % IJ SOLN
40.0000 meq | INTRAMUSCULAR | Status: DC
  Filled 2015-05-28: qty 10

## 2015-05-28 MED ORDER — SODIUM CHLORIDE 0.9 % IV SOLN
INTRAVENOUS | Status: DC
  Filled 2015-05-28: qty 2.5

## 2015-05-28 MED ORDER — CHLORHEXIDINE GLUCONATE 4 % EX LIQD
60.0000 mL | Freq: Once | CUTANEOUS | Status: DC
Start: 2015-05-28 — End: 2015-05-29

## 2015-05-28 MED ORDER — VANCOMYCIN HCL 10 G IV SOLR
1500.0000 mg | INTRAVENOUS | Status: AC
  Administered 2015-05-29: 1500 mg via INTRAVENOUS
  Filled 2015-05-28: qty 1500

## 2015-05-28 MED ORDER — SODIUM CHLORIDE 0.9 % IV SOLN: INTRAVENOUS | Status: DC

## 2015-05-28 MED ORDER — DOPAMINE-DEXTROSE 3.2-5 MG/ML-% IV SOLN
0.0000 ug/kg/min | INTRAVENOUS | Status: DC
Start: 2015-05-29 — End: 2015-05-29
  Filled 2015-05-28: qty 250

## 2015-05-28 MED ORDER — CHLORHEXIDINE GLUCONATE 4 % EX LIQD: 60.0000 mL | Freq: Once | CUTANEOUS | Status: DC

## 2015-05-28 MED ORDER — NITROGLYCERIN IN D5W 200-5 MCG/ML-% IV SOLN
2.0000 ug/min | INTRAVENOUS | Status: DC
  Filled 2015-05-28: qty 250

## 2015-05-28 MED ORDER — EPINEPHRINE HCL 1 MG/ML IJ SOLN
0.0000 ug/min | INTRAVENOUS | Status: DC
  Filled 2015-05-28: qty 4

## 2015-05-28 NOTE — Anesthesia Preprocedure Evaluation (Addendum)
Anesthesia Evaluation  Patient identified by MRN, date of birth, ID band Patient awake    Reviewed: Allergy & Precautions, NPO status , Patient's Chart, lab work & pertinent test results, reviewed documented beta blocker date and time   Airway Mallampati: II  TM Distance: >3 FB Neck ROM: Full    Dental  (+) Teeth Intact   Pulmonary shortness of breath, former smoker,    breath sounds clear to auscultation       Cardiovascular hypertension, Pt. on medications and Pt. on home beta blockers + CAD and +CHF  + Valvular Problems/Murmurs AS  Rhythm:Regular Rate:Normal     Neuro/Psych  Headaches, PSYCHIATRIC DISORDERS Anxiety  Neuromuscular disease    GI/Hepatic Neg liver ROS, GERD  Medicated,  Endo/Other  negative endocrine ROS  Renal/GU negative Renal ROS  negative genitourinary   Musculoskeletal  (+) Arthritis ,   Abdominal   Peds negative pediatric ROS (+)  Hematology negative hematology ROS (+)   Anesthesia Other Findings   Reproductive/Obstetrics negative OB ROS                            EKG, Cath and Echo reviewed.  Anesthesia Physical Anesthesia Plan  ASA: IV  Anesthesia Plan: General   Post-op Pain Management:    Induction: Intravenous  Airway Management Planned: Oral ETT  Additional Equipment: Arterial line, CVP, PA Cath, Ultrasound Guidance Line Placement and TEE  Intra-op Plan:   Post-operative Plan: Possible Post-op intubation/ventilation  Informed Consent: I have reviewed the patients History and Physical, chart, labs and discussed the procedure including the risks, benefits and alternatives for the proposed anesthesia with the patient or authorized representative who has indicated his/her understanding and acceptance.   Dental advisory given  Plan Discussed with: CRNA  Anesthesia Plan Comments:         Anesthesia Quick Evaluation

## 2015-05-28 NOTE — H&P (Signed)
[address]       [place] [address]             [phone]          CARDIOTHORACIC SURGERY HISTORY AND PHYSICAL EXAM  Referring Provider is Sueanne Margarita, MD PCP is [name], MD  Chief Complaint  Patient presents with  . Aortic Stenosis    eval for surgery.[name] KitchenCATH 04/19/15, ECHO, 04/21/14, PFT 05/10/14    HPI:  Patient is an 82 year old male with history of aortic stenosis, coronary artery disease status post PCI and stenting in the distant past, hypertension, GE reflux disease, and mild dementia who has been referred for surgical consultation to discuss treatment options for management of severe symptomatic aortic stenosis. The patient's cardiac history dates back more than 10 years ago when he first presented with symptoms of exertional shortness of breath. He was found to have multivessel coronary artery disease and treated with multivessel PCI and stenting including treatment of the left anterior ascending coronary artery, the left circumflex coronary artery and the posterior descending coronary artery. He initially did well. He was noted to have a murmur on physical exam and echocardiograms revealed the presence of mild to moderate aortic stenosis with mild-to-moderate aortic insufficiency. The patient has been followed regularly by Dr. Radford Pax for the last several years. He has complained of chronic exertional shortness of breath that has progressed. He underwent a nuclear stress test in September 2015 that was felt to be low risk for myocardial ischemia. Transthoracic echocardiograms have demonstrated gradual progression of the severity of aortic stenosis. Echocardiogram performed 04/21/2014 revealed preserved left ventricular systolic function with ejection fraction estimated 55-65%. At that time the peak velocity across the aortic valve was measured 3.6 m/s corresponding to peak and mean transvalvular gradients estimated 51 and 32 mmHg,  respectively. The dimensionless velocity ratio was reported 0.25. The patient's symptoms of exertional shortness of breath continued to progress. He was subsequently referred for pulmonary evaluation and seen in consultation by Dr. Gwenette Greet on 05/22/2014. Pulmonary function tests were performed that did not reveal any significant airflow obstruction although he did have slight improvement with bronchodilator therapy. He was advised to work on weight loss and physical conditioning. The patient's symptoms of exertional shortness of breath have continued to progress. He was seen in follow-up recently by Angelena Form who recommended follow-up diagnostic cardiac catheterization. Catheterization before 04/19/2015 by Dr. Claiborne Billings revealed moderate nonobstructive multivessel coronary artery disease with continued patency in all of his stents. He was found to have severe aortic stenosis with peak to peak and mean transvalvular gradients measured 56 and 49 mmHg by catheterization. The aortic valve area was calculated 0.69 cm. Pulmonary artery pressures were normal. The patient was referred for surgical consultation.  The patient is married and lives locally in [place] with his wife. He has been retired for approximately 13 years, having previously worked for [employer]. He has lived a progressively sedentary lifestyle. He has been limited primarily by severe exertional shortness of breath over the past 2 years. He states that he gets short of breath with minimal physical activity, even just getting up to go to the bathroom at night. He denies any history of resting shortness of breath, PND, orthopnea, or chest pain. He has had some mild lower extremity edema in the past which has resolved with diuretic therapy. He has had occasional dizzy spells without syncope. His mobility is also limited because of chronic pain due to arthritis  afflicting both knees. He walks using a cane. He has had several  mechanical falls but he denies any history of syncope. He also admits to mild short-term memory disturbance. This has not been severe and has not been problematic from a practical standpoint, but it was prominent enough to warrant formal medical evaluation by a neurologist. The patient has been taking Aricept for the past 2 years and according to the patient and his wife he has been quite stable. He still drives an automobile although infrequently. His primary complaint is that of severe exertional shortness of breath and progressive fatigue.         Past Medical History  Diagnosis Date  . Hypertension   . Heart murmur   . BPH (benign prostatic hypertrophy)   . GERD (gastroesophageal reflux disease)   . Anxiety   . Headache(784.0)     relieved with OTC's  . Dementia     early per pt- recently evaluated at [facility]- eccho7/13  , MRI head EPIC 6/13  . Bruises easily   . Unsteady gait   . Hyperlipidemia   . HBP (high blood pressure)   . Allergic rhinitis   . Chronic fatigue   . Left carotid bruit     w minimal obstruction on doppler  . OAB (overactive bladder)   . Dementia, vascular     Dr [name]  . Coronary artery disease     s/p PCI of left cir and LAD 2005 and PCI of prox PDA 7/09  . Shortness of breath dyspnea     with ADLs  . Arthritis   . Osteoarthritis of left knee   . Coronary atherosclerosis of native coronary artery   . Pure hypercholesterolemia   . Essential hypertension, benign   . Aortic insufficiency   . Aortic stenosis, severe   . Chronic diastolic congestive heart failure ([place])   . Incontinence of urine     Past Surgical History  Procedure Laterality Date  . Transurethral resection of prostate    . Turp vaporization    . Cardiac catheterization      3 stents first cath 2 stents placed then additional stent placed  . Hemorroidectomy    . Rotator cuff repair Right 3/13    right  . Tonsillectomy    . Knee arthroscopy  09/19/2011    Procedure:  ARTHROSCOPY KNEE;  Surgeon: [name], MD;  Location: [facility];  Service: Orthopedics;  Laterality: Left;  . Colonscopy     . Eye surgery Bilateral     cataract surgery bilat   . Total knee arthroplasty Left 02/21/2014    Procedure: LEFT TOTAL KNEE ARTHROPLASTY;  Surgeon: [name], MD;  Location: [facility];  Service: Orthopedics;  Laterality: Left;  . Stent placed      x3 per pt  . Cardiac catheterization N/A 04/19/2015    Procedure: Right/Left Heart Cath and Coronary Angiography;  Surgeon: [name], MD;  Location: [facility];  Service: Cardiovascular;  Laterality: N/A;  . Colonoscopy w/ polypectomy      Family History  Problem Relation Age of Onset  . Anesthesia problems Neg Hx   . Lung cancer Father   . Heart attack Neg Hx     Social History Social History  Substance Use Topics  . Smoking status: Former Smoker -- 20 years    Types: Cigars    Quit date: 02/10/1970  . Smokeless tobacco: Former Systems developer    Types: Loss adjuster, chartered  Comment: smoked cigars off and on x 20 years  . Alcohol Use: 1.8 oz/week    3 Glasses of wine per week     Comment: weekly/    occ beer/wine not recently (05/25/15)    Prior to Admission medications   Medication Sig Start Date End Date Taking? Authorizing Provider  acetaminophen (TYLENOL) 325 MG tablet Take 650 mg by mouth every 6 (six) hours as needed for mild pain.   Yes Historical Provider, MD  amLODipine (NORVASC) 5 MG tablet TAKE 1 TABLET (5 MG TOTAL) BY MOUTH DAILY. 05/07/15  Yes Sueanne Margarita, MD  aspirin 81 MG tablet Take 81 mg by mouth daily.   Yes Historical Provider, MD  carvedilol (COREG) 12.5 MG tablet TAKE 1 TABLET TWICE DAILY WITH MEALS 05/11/15  Yes Sueanne Margarita, MD  chlorhexidine (PERIDEX) 0.12 % solution Rinse with 15 mls twice daily for 30 seconds. Use after breakfast and at bedtime. Spit out excess. Do not swallow. 05/01/15  Yes Lenn Cal, DDS  clopidogrel (PLAVIX) 75 MG tablet Take 1 tablet (75 mg total) by mouth  daily. 05/14/15  Yes Sueanne Margarita, MD  donepezil (ARICEPT) 10 MG tablet Take 10 mg by mouth at bedtime.   Yes Historical Provider, MD  finasteride (PROSCAR) 5 MG tablet Take 5 mg by mouth daily.   Yes Historical Provider, MD  furosemide (LASIX) 20 MG tablet TAKE 1 TABLET EVERY DAY AFTER BREAKFAST 02/13/15  Yes Sueanne Margarita, MD  losartan (COZAAR) 50 MG tablet TAKE 1 TABLET EVERY DAY (NEED MD APPOINTMENT)  CALL  [phone] 05/11/15  Yes Sueanne Margarita, MD  Multiple Vitamin (MULTIVITAMIN WITH MINERALS) TABS Take 1 tablet by mouth daily.   Yes Historical Provider, MD  nitroGLYCERIN (NITROSTAT) 0.4 MG SL tablet Place 1 tablet (0.4 mg total) under the tongue every 5 (five) minutes as needed. For chest pain 10/20/14  Yes Sueanne Margarita, MD  omeprazole (PRILOSEC) 40 MG capsule Take 1 capsule (40 mg total) by mouth daily. 12/28/14  Yes Sueanne Margarita, MD  potassium chloride SA (K-DUR,KLOR-CON) 20 MEQ tablet Take 1 tablet (20 mEq total) by mouth daily. 12/13/14  Yes Sueanne Margarita, MD  rosuvastatin (CRESTOR) 20 MG tablet Take 0.5 tablets (10 mg total) by mouth daily. 01/15/15  Yes Sueanne Margarita, MD  sertraline (ZOLOFT) 50 MG tablet Take 50 mg by mouth every morning.    Yes Historical Provider, MD    Allergies  Allergen Reactions  . Latex Rash  . Sulfa Antibiotics Hives and Rash    Review of Systems:  General:normal appetite, decreased energy, no weight gain, no weight loss, no fever Cardiac:no chest pain with exertion, no chest pain at rest, + SOB with minimal exertion, no resting SOB, no PND, no orthopnea, no palpitations, no arrhythmia, no atrial fibrillation, + LE edema, + dizzy spells, no syncope Respiratory:+ shortness of breath, no home oxygen, no productive cough, + chronic dry cough, no bronchitis, no wheezing, no hemoptysis, no asthma, no pain with inspiration or cough, no sleep apnea, no CPAP at  night [id number] difficulty swallowing, + reflux, no frequent heartburn, no hiatal hernia, no abdominal pain, no constipation, no diarrhea, no hematochezia, no hematemesis, no melena GU:no dysuria, + frequency, no urinary tract infection, no hematuria, + enlarged prostate, no kidney stones, no kidney disease Vascular:+ pain suggestive of claudication, + pain in feet, + leg cramps, no varicose veins, no DVT, no non-healing foot ulcer Neuro:no stroke, no TIA's, no seizures, no  headaches, no temporary blindness one eye, no slurred speech, no peripheral neuropathy, + mild chronic pain, + mild instability of gait, + mild memory/cognitive dysfunction Musculoskeletal:+ arthritis, no joint swelling, no myalgias, + some difficulty walking, decreased mobility  Skin:no rash, no itching, no skin infections, no pressure sores or ulcerations Psych:no anxiety, no depression, no nervousness, no unusual recent stress Eyes:no blurry vision, no floaters, no recent vision changes, does not wears glasses or contacts ENT:no hearing loss, no loose or painful teeth, no dentures, last saw dentist several years ago Hematologic:+ easy bruising, + abnormal bleeding, no clotting disorder, no frequent epistaxis Endocrine:no diabetes, does not check CBG's at home       Physical Exam:  BP 141/84 mmHg  Pulse 81  Resp 18  Ht 6\' 2"  (1.88 m)  Wt 253 lb (114.76 kg)  BMI 32.47 kg/m2  SpO2 97% General:Moderately obese, well-appearing, somewhat  deconditioned HEENT:Unremarkable  Neck:no JVD, no bruits, no adenopathy  Chest:clear to auscultation, symmetrical breath sounds, no wheezes, no rhonchi  CV:RRR, grade III/VI crescendo/decrescendo murmur heard best at RSB, no diastolic murmur Abdomen:soft, non-tender, no masses  Extremities:warm, well-perfused, pulses palpable both groins, not palpable at ankles, no LE edema Rectal/GUDeferred Neuro:Grossly non-focal and symmetrical throughout Skin:Clean and dry, no rashes, no breakdown   Diagnostic Tests:  Transthoracic Echocardiography  Patient:  [name], [name] MR #:    [id number] Study Date: 04/21/2014 Gender:   M Age:    64 Height:   188 cm Weight:   111.6 kg BSA:    2.44 m^2 Pt. Status: Room:  ORDERING   Fransico Him, MD REFERRING  Fransico Him, MD SONOGRAPHER Victorio Palm, RDCS ATTENDING  Dorris Carnes, M.D. PERFORMING  [facility], Outpatient  cc:  ------------------------------------------------------------------- LV EF: 55% -  65%  ------------------------------------------------------------------- Indications:   Aortic stenosis (I35.9).  ------------------------------------------------------------------- History:  PMH: Acquired from the patient and from the patient&'s chart. Coronary artery disease. Coronary artery disease. Moderate aortic stenosis. Moderate aortic regurgitation. Risk factors: Hypertension. Obese. Dyslipidemia.  ------------------------------------------------------------------- Study Conclusions  - Left ventricle: The cavity size was normal. Systolic function was normal. The  estimated ejection fraction was in the range of 55% to 65%. Doppler parameters are consistent with abnormal left ventricular relaxation (grade 1 diastolic dysfunction). - Aortic valve: AV is thckened, calciifed with restricted motion. Peak and mean gradients through the valve are 51 and 31 mm Hg consiste with moderate AS. There was mild regurgitation. Peak gradient (S): 51 mm Hg. - Left atrium: The atrium was mildly dilated.  Impressions:  - Since report of October 2015 there is mild increase in gradients across AV.  ------------------------------------------------------------------- Labs, prior tests, procedures, and surgery: Echocardiography (October 2015).  The aortic valve showed moderate stenosis. EF was 60% and PA pressure was 31 (systolic). Aortic valve: peak gradient of 48 mm Hg and mean gradient of 28 mm Hg.  Catheterization.  The study demonstrated coronary artery disease. There was a stenosis in the left circumflex coronary artery which was treated with a percutaneous intervention. There was a stenosis in the left anterior descending coronary artery which was treated with a percutaneous intervention. There was a stenosis in the right posterior descending coronary artery which was treated with a percutaneous intervention. Transthoracic echocardiography. M-mode, complete 2D, spectral Doppler, and color Doppler. Birthdate: Patient birthdate: 09-Sep-1933. Age: Patient is 82 yr old. Sex: Gender: male. BMI: 31.6 kg/m^2. Blood pressure:   144/72 Patient status: Outpatient. Study date: Study date: 04/21/2014. Study time: 09:55 AM. Location: [facility]  -------------------------------------------------------------------  -------------------------------------------------------------------  Left ventricle: The cavity size was normal. Systolic function was normal. The estimated ejection fraction was in the range of 55% to 65%. Doppler  parameters are consistent with abnormal left ventricular relaxation (grade 1 diastolic dysfunction).  ------------------------------------------------------------------- Aortic valve: AV is thckened, calciifed with restricted motion. Peak and mean gradients through the valve are 51 and 31 mm Hg consiste with moderate AS. Doppler: There was mild regurgitation.  VTI ratio of LVOT to aortic valve: 0.28. Valve area (VTI): 1.27 cm^2. Indexed valve area (VTI): 0.52 cm^2/m^2. Mean velocity ratio of LVOT to aortic valve: 0.25. Valve area (Vmean): 1.12 cm^2. Indexed valve area (Vmean): 0.46 cm^2/m^2.  Mean gradient (S): 32 mm Hg. Peak gradient (S): 51 mm Hg.  ------------------------------------------------------------------- Aorta: Mild dilation of proximal ascending aorta (42 mm)  ------------------------------------------------------------------- Mitral valve:  Structurally normal valve.  Leaflet separation was normal. Doppler: Transvalvular velocity was within the normal range. There was no evidence for stenosis. There was no regurgitation.  ------------------------------------------------------------------- Left atrium: The atrium was mildly dilated.  ------------------------------------------------------------------- Right ventricle: The cavity size was normal. Wall thickness was normal. Systolic function was normal.  ------------------------------------------------------------------- Pulmonic valve:  Structurally normal valve.  Cusp separation was normal. Doppler: Transvalvular velocity was within the normal range. There was no regurgitation.  ------------------------------------------------------------------- Tricuspid valve:  Structurally normal valve.  Leaflet separation was normal. Doppler: Transvalvular velocity was within the normal range. There was mild regurgitation.  ------------------------------------------------------------------- Right atrium:  The atrium was normal in size.  ------------------------------------------------------------------- Pericardium: There was no pericardial effusion.  ------------------------------------------------------------------- Post procedure conclusions Ascending Aorta:  - Mild dilation of proximal ascending aorta (42 mm)  ------------------------------------------------------------------- Measurements  Left ventricle              Value     Reference LV ID, ED, PLAX chordal      (L)   34  mm    43 - 52 LV ID, ES, PLAX chordal          23  mm    23 - 38 LV fx shortening, PLAX chordal      32  %    >=29 LV PW thickness, ED            11  mm    --------- IVS/LV PW ratio, ED            1.18      <=1.3 Stroke volume, 2D             115  ml    --------- Stroke volume/bsa, 2D           47  ml/m^2  --------- LV e&', lateral              10.1 cm/s   --------- LV E/e&', lateral             6.05      --------- LV e&', medial               4.57 cm/s   --------- LV E/e&', medial              13.37     --------- LV e&', average              7.34 cm/s   --------- LV E/e&', average             8.33      ---------  Ventricular septum            Value     Reference IVS thickness,  ED             13  mm    ---------  LVOT                   Value     Reference LVOT ID, S                24  mm    --------- LVOT area                 4.52 cm^2   --------- LVOT ID                  24  mm    --------- LVOT mean velocity, S           66.3 cm/s   --------- LVOT VTI, S                25.4 cm    --------- Stroke volume  (SV), LVOT DP        114.9 ml    --------- Stroke index (SV/bsa), LVOT DP      47.1 ml/m^2  ---------  Aortic valve               Value     Reference Aortic valve peak velocity, S       357  cm/s   --------- Aortic valve mean velocity, S       267  cm/s   --------- Aortic valve VTI, S            90.1 cm    --------- Aortic mean gradient, S          32  mm Hg  --------- Aortic peak gradient, S          51  mm Hg  --------- VTI ratio, LVOT/AV            0.28      --------- Aortic valve area, VTI          1.27 cm^2   --------- Aortic valve area/bsa, VTI        0.52 cm^2/m^2 --------- Velocity ratio, mean, LVOT/AV       0.25      --------- Aortic valve area, mean velocity     1.12 cm^2   --------- Aortic valve area/bsa, mean        0.46 cm^2/m^2 --------- velocity  Aorta                   Value     Reference Aortic root ID, ED            32  mm    --------- Ascending aorta ID, A-P, S        42  mm    ---------  Left atrium                Value     Reference LA ID, A-P, ES              44.96 mm    --------- LA ID/bsa, A-P              1.84 cm/m^2  <=2.2 LA volume, S               59.7 ml    --------- LA volume/bsa, S             24.5 ml/m^2  --------- LA volume, ES, 1-p A4C  42.1 ml    --------- LA volume/bsa, ES, 1-p A4C        17.2 ml/m^2  --------- LA volume, ES, 1-p A2C          78.1 ml    --------- LA volume/bsa, ES, 1-p A2C        32  ml/m^2  ---------  Mitral valve               Value     Reference Mitral E-wave peak velocity        61.1 cm/s   --------- Mitral  deceleration time     (H)   264  ms    150 - 230 Mitral E/A ratio, peak          0.7      ---------  Pulmonary arteries            Value     Reference PA pressure, S, DP            29  mm Hg  <=30  Tricuspid valve              Value     Reference Tricuspid regurg peak velocity      255  cm/s   --------- Tricuspid peak RV-RA gradient       26  mm Hg  ---------  Systemic veins              Value     Reference Estimated CVP               3   mm Hg  ---------  Right ventricle              Value     Reference RV pressure, S, DP            29  mm Hg  <=30 RV s&', lateral, S             13.8 cm/s   ---------  Legend: (L) and (H) mark values outside specified reference range.  ------------------------------------------------------------------- Prepared and Electronically Authenticated by  Dorris Carnes, M.D. 2016-03-11T14:42:28       CARDIAC CATHETERIZATION Procedures    Right/Left Heart Cath and Coronary Angiography    Conclusion     Mid RCA lesion, 25% stenosed.  The left ventricular systolic function is normal.  Normal LV function with an ejection fraction of 60-65%.  Severely calcified aortic valve with reduced excursion, and evidence for severe aortic valve stenosis with a peak to peak gradient of 56, a mean gradient of 49.3, and a calculated valve area of 0.69 cm.  Dilated aortic root.  Mild nonobstructive CAD with widely patent stents in the proximal and mid LAD; patent stent in the ramus intermediate vessel; normal left circumflex coronary artery; and dominant RCA with 20-30% mid nonobstructive stenosis.  RECOMMENDATION: The patient will require aortic valve replacement surgery. Dr. Radford Pax was notified. Surgical consultation will be obtained as an outpatient  for consideration of TAVR versus open procedure.     Indications    Aortic stenosis [I35.0 (ICD-10-CM)]    Technique and Indications    Mr. [name] is an 82 year old patient of Dr. Radford Pax who has a history of previously documented at least moderate aortic stenosis. He has undergone prior stenting to his LAD and high marginal/ramus intermediate vessel. He recently has developed progressive increasing shortness of breath with minimal activity. He is referred for right left heart catheterization.  The patient was brought to the  second floor [facility] cath lab in the fasting state. Versed 2 mg and fentanyl 25 mcg were administered for conscious sedation. The right groin was prepped and draped in sterile fashion and a 5 [place] arterial sheath and 7 French venous sheath were inserted without difficulty. A Swan-Ganz catheter was advanced into the venous sheath and pressures were obtained in the right atrium, right ventricle, pulmonary artery, and pulmonary capillary wedge position. Cardiac outputs were obtained by the thermodilution and assumed Fick methods. Oxygen saturation was obtained in the pulmonary artery and aorta. A pigtail catheter was inserted and simultaneous AO/PA pressures were recorded. The pigtail catheter was advanced into the left ventricle and simultaneous left ventricular and PCW pressures were recorded. Left ventriculography was performed in the RAO projection. A left ventricle to aorta pullback was performed. The pigtail catheter was then removed and diagnostic catheterization to delineate the coronary anatomy was performed utilizing 5 French Judkins 4 left and right diagnostic catheters. All catheters were removed and the patient. Hemostasis was obtained by direct manual pressure. The patient tolerated the procedure well and returned to his room in satisfactory condition.    During this procedure the patient was administered a total of Versed 2 mg and Fentanyl  25 mg to achieve and maintain moderate conscious sedation. The patient's heart rate, blood pressure, and oxygen saturation are monitored continuously during the procedure. The period of conscious sedation is 60 minutes, of which I was present face-to-face 100% of this time. : Estimated blood loss <50 mL. There were no immediate complications during the procedure.    Coronary Findings    Dominance: Right   Left Anterior Descending   . Mid LAD lesion, 0% stenosed. Previously placed Mid LAD stent (unknown type) is patent.   Jorene Minors LAD lesion, 0% stenosed. Previously placed Dist LAD stent (unknown type) is patent.     Ramus Intermedius   . Ramus lesion, 0% stenosed. Previously placed Ramus stent (unknown type) is patent.     Right Coronary Artery   . Mid RCA lesion, 25% stenosed.       Right Heart Pressures Hemodynamic findings consistent with mild pulmonary hypertension. Elevated LV EDP consistent with volume overload. RA: a 15 mean 13 RV: 28/10 PA: 32/11 PCWP: mean 18  LV: 198/17 PC: 15  Pullback: LV: 205/19/27 AO: 158/80    Wall Motion                 Left Heart    Left Ventricle The left ventricular size is normal. The left ventricular systolic function is normal. The left ventricular ejection fraction is 55-65% by visual estimate. There are no wall motion abnormalities in the left ventricle.   Aortic Valve There is severe aortic valve stenosis, and no aortic valve regurgitation. The aortic valve is calcified. There is restricted aortic valve motion.   Aorta The ascending aorta is dilated.    Coronary Diagrams    Diagnostic Diagram            Implants    No implant documentation for this case.    PACS Images    Show images for Cardiac catheterization     Link to Procedure Log    Procedure Log      Hemo Data       Most Recent Value   Fick Cardiac  Output  5.74 L/min   Fick Cardiac Output Index  2.4 (L/min)/BSA   Thermal Cardiac Output  5.34 L/min   Thermal Cardiac Output Index  2.23 (L/min)/BSA   Aortic Mean Gradient  49.3 mmHg   Aortic Peak Gradient  56 mmHg   Aortic Valve Area  0.69   Aortic Value Area Index  0.29 cm2/BSA   Mitral Mean Gradient  1.3 mmHg   Mitral Peak Gradient  0 mmHg   Mitral Valve Area Index  1.46 cm2/BSA   RA A Wave  18 mmHg   RA V Wave  17 mmHg   RA Mean  17 mmHg   RV Systolic Pressure  28 mmHg   RV Diastolic Pressure  7 mmHg   RV EDP  10 mmHg   PA Systolic Pressure  32 mmHg   PA Diastolic Pressure  11 mmHg   PA Mean  17 mmHg   PW A Wave  15 mmHg   PW V Wave  17 mmHg   PW Mean  15 mmHg   AO Systolic Pressure  0000000 mmHg   AO Diastolic Pressure  80 mmHg   AO Mean  99991111 mmHg   LV Systolic Pressure  99991111 mmHg   LV Diastolic Pressure  14 mmHg   LV EDP  17 mmHg   Arterial Occlusion Pressure Extended Systolic Pressure  123456 mmHg   Arterial Occlusion Pressure Extended Diastolic Pressure  77 mmHg   Arterial Occlusion Pressure Extended Mean Pressure  104 mmHg   Left Ventricular Apex Extended Systolic Pressure  99991111 mmHg   Left Ventricular Apex Extended Diastolic Pressure  19 mmHg   Left Ventricular Apex Extended EDP Pressure  27 mmHg   TPVR Index  13.88 HRUI   TSVR Index  46.55 HRUI   PVR SVR Ratio  0.14   TPVR/TSVR Ratio  0.3    Pulmonary Function Tests  BaselinePost-bronchodilator  FVC3.83 L (83% predicted)FVC4.06 L (88% predicted) FEV12.80 L (85% predicted)FEV13.17 L (96% predicted) FEF25-751.90 L (82% predicted)FEF25-753.45 L  (145% predicted)  TLC6.15 L (80% predicted) RV2.24 L (79% predicted) DLCO67% predicted   STS Risk Calculator  ProcedureAVR + CABG  Risk of Mortality4.2% Morbidity or Mortality27.4% Prolonged LOS15.7% Short LOS20.9% Permanent Stroke2.2% Prolonged Vent Support17.7% DSW Infection0.6% Renal Failure9.3% Reoperation9.7%   Echocardiography  Patient: [name], [name] MR #: [id number] Study Date: 05/09/2015 Gender: M Age: 47 Height: 188 cm Weight: 114.8 kg BSA: 2.48 m^2 Pt. Status: Room:  ATTENDING Darylene Price, M.D. ORDERING Darylene Price, M.D. PERFORMING [facility], Outpatient SONOGRAPHER Darlina Sicilian, RDCS  cc:  -------------------------------------------------------------------  ------------------------------------------------------------------- Indications: Aortic stenosis 424.1.  ------------------------------------------------------------------- History: PMH: Dyspnea. Coronary artery disease. Risk factors: Hypertension.  ------------------------------------------------------------------- Study Conclusions  - Left ventricle: The cavity size was normal. Wall thickness was  increased in a pattern of moderate LVH. There was focal basal  hypertrophy. Doppler parameters are consistent with abnormal left  ventricular relaxation (grade 1 diastolic dysfunction). - Aortic valve: There was moderate stenosis. There was mild  regurgitation.  Echocardiography. M-mode, complete 2D, spectral Doppler, and  color Doppler. Birthdate: Patient birthdate: September 29, 1933. Age: Patient is 82 yr old. Sex: Gender: male. BMI: 32.5 kg/m^2. Blood pressure: 146/72 Patient status: Inpatient. Study date: Study date: 05/09/2015. Study time: 01:52 PM. Location: Echo laboratory.  -------------------------------------------------------------------  ------------------------------------------------------------------- Left ventricle: The cavity size was normal. Wall thickness was increased in a pattern of moderate LVH. There was focal basal hypertrophy. Doppler parameters are consistent with abnormal left ventricular relaxation (grade 1 diastolic dysfunction).  ------------------------------------------------------------------- Aortic valve: Moderately thickened, moderately calcified leaflets. Doppler: There was moderate stenosis. There was mild regurgitation. VTI ratio of LVOT to aortic valve: 0.34. Peak velocity ratio of LVOT  to aortic valve: 0.25. Mean velocity ratio of LVOT to aortic valve: 0.28. Mean gradient (S): 25 mm Hg. Peak gradient (S): 47 mm Hg.  ------------------------------------------------------------------- Aorta: Aortic root: The aortic root was normal in size. Ascending aorta: The ascending aorta was normal in size.  ------------------------------------------------------------------- Mitral valve: Structurally normal valve. Leaflet separation was normal. Doppler: Transvalvular velocity was within the normal range. There was no evidence for stenosis. There was no regurgitation.  ------------------------------------------------------------------- Right ventricle: The cavity size was normal. Systolic function was normal.  ------------------------------------------------------------------- Tricuspid valve: Structurally normal valve. Leaflet separation was normal. Doppler: Transvalvular velocity was within the normal range. There was  mild regurgitation.  ------------------------------------------------------------------- Pulmonary artery: Systolic pressure was within the normal range.  ------------------------------------------------------------------- Right atrium: The atrium was normal in size.  ------------------------------------------------------------------- Pericardium: There was no pericardial effusion.  ------------------------------------------------------------------- Measurements  Left ventricle Value Reference LV ID, ED, PLAX chordal (L) 40.9 mm 43 - 52 LV ID, ES, PLAX chordal 32 mm 23 - 38 LV fx shortening, PLAX chordal (L) 22 % >=29 LV PW thickness, ED 12.4 mm --------- IVS/LV PW ratio, ED (H) 1.4 <=1.3 LV e&', lateral 6.53 cm/s --------- LV E/e&', lateral 7.35 --------- LV e&', medial 4.03 cm/s --------- LV E/e&', medial 11.91 --------- LV e&', average 5.28 cm/s --------- LV E/e&', average 9.09 ---------  Ventricular septum Value Reference IVS thickness, ED 17.4 mm ---------  LVOT Value Reference LVOT peak velocity, S 86.8 cm/s --------- LVOT mean velocity, S 65.2 cm/s --------- LVOT VTI, S 24.3 cm ---------  Aortic valve Value Reference Aortic valve peak velocity, S 344 cm/s --------- Aortic valve mean velocity, S 234 cm/s --------- Aortic valve VTI, S 71 cm  --------- Aortic mean gradient, S 25 mm Hg --------- Aortic peak gradient, S 47 mm Hg --------- VTI ratio, LVOT/AV 0.34 --------- Velocity ratio, peak, LVOT/AV 0.25 --------- Velocity ratio, mean, LVOT/AV 0.28 --------- Aortic regurg pressure half-time 569 ms ---------  Aorta Value Reference Aortic root ID, ED 33 mm ---------  Left atrium Value Reference LA ID, A-P, ES 43 mm --------- LA ID/bsa, A-P 1.73 cm/m^2 <=2.2 LA volume, ES, 1-p A4C 65.3 ml --------- LA volume/bsa, ES, 1-p A4C 26.3 ml/m^2 --------- LA volume, ES, 1-p A2C 27.7 ml --------- LA volume/bsa, ES, 1-p A2C 11.2 ml/m^2 ---------  Mitral valve Value Reference Mitral E-wave peak velocity 48 cm/s --------- Mitral A-wave peak velocity 90 cm/s --------- Mitral deceleration time (H) 356 ms 150 - 230 Mitral E/A ratio, peak 0.5 ---------  Pulmonary arteries Value Reference PA pressure, S, DP 22 mm Hg <=30  Tricuspid valve Value Reference Tricuspid regurg peak velocity 219 cm/s --------- Tricuspid peak RV-RA gradient 19 mm Hg ---------  Systemic veins Value Reference Estimated CVP 3 mm Hg ---------  Right ventricle Value Reference TAPSE 20.1 mm --------- RV  pressure, S, DP 22 mm Hg <=30 RV s&', lateral, S 14.4 cm/s ---------  Legend: (L) and (H) mark values outside specified reference range.  ------------------------------------------------------------------- Prepared and Electronically Authenticated by  Mertie Moores, M.D. 2017-03-29T17:20:29   Cardiac TAVR CT TECHNIQUE: The patient was scanned on a Philips 256 scanner. A 120 kV retrospective scan was triggered in the descending thoracic aorta at 111 HU's. Gantry rotation speed was 270 msecs and collimation was .9 mm. No beta blockade or nitro were given. The 3D data set was reconstructed in 5% intervals of the R-R cycle. Systolic and diastolic phases were analyzed on a dedicated work station using MPR, MIP and VRT modes. The patient received 80 cc  of contrast. FINDINGS: Aortic Valve: Tri-leaflet with heavy calcification of all 3 leaflets. No significant calcium in annulus Aorta: No aneurysmal dilatation minimal calcific plaque in arch normal take off of arch vessels Sinotubular Junction: 30 mm Ascending Thoracic Aorta: 36 mm Aortic Arch: 30 mm Descending Thoracic Aorta: 28 mm Sinus of Valsalva Measurements: Non-coronary: 33.5 mm Right -coronary: 32 mm Left -coronary: 32.6 mm Coronary Artery Height above Annulus: Left Main: 15 mm Right Coronary: 18 mm Virtual Basal Annulus Measurements: Maximum/Minimum Diameter: 30 mm x 23.4 mm Perimeter: 88 mm Area: 605 mm2 Coronary Arteries: Sufficient height for deployment Optimum Fluoroscopic Angle for Delivery: LAO 11 degrees Cranial 0 degrees ? Filling defect in LAA IMPRESSION: 1) Calcified tri-leaflet aortic valve Annular area 605 mm2 suitable for 29 mm Sapien 3 valve 2) Coronary arteries suitable height for deployment 3) Normal aortic root and arch 4) Optimum angiographic angle for deployment LAO 11 degrees Cranial 0 degrees 5) Cannot r/o filling defect  in LAA Patient has no history of PAF Should interrogate during intraop TEE Jenkins Rouge Electronically Signed  By: Jenkins Rouge M.D.  On: 05/09/2015 12:55     Study Result     EXAM: OVER-READ INTERPRETATION CT CHEST  The following report is an over-read performed by radiologist Dr. Rebekah Chesterfield [facility] on 05/09/2015. This over-read does not include interpretation of cardiac or coronary anatomy or pathology. The coronary calcium score/coronary CTA interpretation by the cardiologist is attached.  COMPARISON: No priors.  FINDINGS: Extracardiac findings will be described under separate dictation for contemporaneously obtained CTA chest, abdomen and pelvis dated 05/09/2015.  IMPRESSION: Please see separate dictation for contemporaneously obtained CTA of the chest, abdomen and pelvis 05/09/2015 for full description of extracardiac findings.  Electronically Signed: By: Vinnie Langton M.D. On: 05/09/2015 12:42   CT ANGIOGRAPHY CHEST, ABDOMEN AND PELVIS  TECHNIQUE: Multidetector CT imaging through the chest, abdomen and pelvis was performed using the standard protocol during bolus administration of intravenous contrast. Multiplanar reconstructed images and MIPs were obtained and reviewed to evaluate the vascular anatomy.  CONTRAST: 67mL OMNIPAQUE IOHEXOL 350 MG/ML SOLN  COMPARISON: No prior chest CT. CT the abdomen and pelvis 02/08/2013.  FINDINGS: CTA CHEST FINDINGS  Mediastinum/Lymph Nodes: Heart size is normal. There is no significant pericardial fluid, thickening or pericardial calcification. There is atherosclerosis of the thoracic aorta, the great vessels of the mediastinum and the coronary arteries, including calcified atherosclerotic plaque in the left main, left anterior descending left circumflex and right coronary arteries. Severe calcifications of the aortic valve. No pathologically enlarged mediastinal or hilar  lymph nodes. Small hiatal hernia. No axillary lymphadenopathy.  Lungs/Pleura: A few scattered tiny 1-3 mm pulmonary nodules are noted throughout the lungs bilaterally, statistically likely benign. No larger more suspicious appearing pulmonary nodules or masses. No acute consolidative airspace disease. No pleural effusions. Small calcified pleural plaque in the anterior aspect of the left hemithorax. No right-sided pleural plaques identified.  Musculoskeletal/Soft Tissues: There are no aggressive appearing lytic or blastic lesions noted in the visualized portions of the skeleton.  CTA ABDOMEN AND PELVIS FINDINGS  Hepatobiliary: Diffuse low attenuation throughout the hepatic parenchyma, compatible with hepatic steatosis. No cystic or solid hepatic lesions are noted. No intra or extrahepatic biliary ductal dilatation. Gallbladder is normal in appearance.  Pancreas: No pancreatic mass. No pancreatic ductal dilatation. No pancreatic or peripancreatic fluid or inflammatory changes.  Spleen: Unremarkable.  Adrenals/Urinary Tract: Small bilateral adrenal gland nodules are again noted, similar to remote prior study from 02/08/2013 at which  point these were classified as adenomas. These measure 1.5 cm on the right and 1.4 cm on the left. Multiple parapelvic cysts are noted in the kidneys bilaterally. No suspicious renal lesions. No hydroureteronephrosis. Mild thickening and irregularity of the urinary bladder wall, including multiple small bladder diverticulae.  Stomach/Bowel: The appearance of the stomach is normal. There is no pathologic dilatation of small bowel or colon. Normal appendix.  Vascular/Lymphatic: Atherosclerosis throughout the abdominal vasculature with vascular findings and measurements pertinent to potential TAVR procedure, as detailed. No aneurysm or dissection is noted. Single renal arteries bilaterally. Celiac axis, superior mesenteric artery and inferior  mesenteric artery and major branches are all widely patent. No lymphadenopathy identified in the abdomen or pelvis.  Reproductive: Prostate gland is enlarged and heterogeneous in appearance measuring up to 4.5 x 5.9 x 6.8 cm. Seminal vesicles are unremarkable in appearance.  Other: No significant volume of ascites. No pneumoperitoneum.  Musculoskeletal: There are no aggressive appearing lytic or blastic lesions noted in the visualized portions of the skeleton.  VASCULAR MEASUREMENTS PERTINENT TO TAVR:  AORTA:  Minimal Aortic Diameter - 17 x 17 mm  Severity of Aortic Calcification - mild  RIGHT PELVIS:  Right Common Iliac Artery -  Minimal Diameter - 10.6 x 9.9 mm  Tortuosity - mild  Calcification - mild  Right External Iliac Artery -  Minimal Diameter - 7.5 x 8.8 mm  Tortuosity - mild  Calcification - none  Right Common Femoral Artery -  Minimal Diameter - 8.0 x 8.5 mm  Tortuosity - mild  Calcification - mild  LEFT PELVIS:  Left Common Iliac Artery -  Minimal Diameter - 11.7 x 10.2 mm  Tortuosity - mild  Calcification - mild  Left External Iliac Artery -  Minimal Diameter - 9.2 x 9.1 mm  Tortuosity - mild  Calcification - minimal  Left Common Femoral Artery -  Minimal Diameter - 8.6 x 6.8 mm  Tortuosity - mild  Calcification - mild  Review of the MIP images confirms the above findings.  IMPRESSION: 1. Vascular findings and measurements pertinent to potential T AVR procedure, as detailed above. This patient does appear to have suitable pelvic arterial access bilaterally. 2. Severely calcified and thickened aortic valve, compatible with the reported clinical history of severe aortic stenosis. 3. Atherosclerosis, including left main and 3 vessel coronary artery disease. 4. Hepatic steatosis. 5. Bilateral adrenal adenomas redemonstrated. 6. Markedly enlarged heterogeneous appearing prostate  gland, presumably from benign prostatic hypertrophy. This is associated with some thickening and irregularity of the urinary bladder wall with multiple small diverticulae, suggesting bladder outlet obstruction. 7. Additional incidental findings, as above.   Electronically Signed  By: Vinnie Langton M.D.  On: 05/09/2015 13:10            Impression:  Patient has stage D severe symptomatic aortic stenosis. He presents with progressive symptoms of exertional shortness of breath and fatigue consistent with chronic diastolic congestive heart failure, [place] Heart Association functional class III. He now is severely limited and gets short of breath with minimal activity. He has had several dizzy spells without syncope. I have personally reviewed the patient's most recent transthoracic echocardiogram which was performed more than one year ago. At that time the patient had at least moderate aortic stenosis with severe thickening, calcification, and restricted leaflet mobility involving all 3 leaflets of the aortic valve. Peak velocity across the aortic valve at that time was measured 3.6 m/s corresponding to mean transvalvular gradient estimated 32 mmHg. The dimensionless  velocity index was 0.25. The patient's symptoms have continued to progress over the past year, and diagnostic cardiac catheterization performed last week confirmed the presence of severe aortic stenosis with peak to peak and mean transvalvular gradients measured 56 and 49 mmHg respectively corresponding to aortic valve area calculated 0.69 cm. The patient has known history of coronary artery disease and underwent multivessel PCI and stenting in the distant past. Diagnostic catheterization confirms continued patency and all the stents with otherwise moderate nonobstructive disease. The patient has never had any symptoms of exertional chest pain or chest tightness. I agree that the patient would likely benefit from aortic  valve replacement. Risks associated with conventional surgical aortic valve replacement with or without coronary artery bypass grafting would be at least moderately elevated because of the patient's age and numerous comorbid medical problems. Given his somewhat limited physical mobility, degree of deconditioning, and underlying mild dementia I feel that the risks associated with conventional surgery might be considerably higher. Transcatheter aortic valve replacement may prove to be a far less invasive and potentially less risky alternative.  CT angiography demonstrates findings consistent with severe aortic stenosis and anatomical characteristics suitable for TAVR via transfemoral approach without any significant complicating features.   Plan:  The patient and his wife were counseled at length regarding treatment alternatives for management of severe symptomatic aortic stenosis. Alternative approaches such as conventional aortic valve replacement, transcatheter aortic valve replacement, and palliative medical therapy were compared and contrasted at length. The risks associated with conventional surgical aortic valve replacement were been discussed in detail, as were expectations for post-operative convalescence. Long-term prognosis with medical therapy was discussed. This discussion was placed in the context of the patient's own specific clinical presentation and past medical history. All of their questions been addressed.  Following the decision to proceed with transcatheter aortic valve replacement, a discussion has been held regarding what types of management strategies would be attempted intraoperatively in the event of life-threatening complications, including whether or not the patient would be considered a candidate for the use of cardiopulmonary bypass and/or conversion to open sternotomy for attempted surgical intervention.  The patient has been advised of a variety of complications that might  develop including but not limited to risks of death, stroke, paravalvular leak, aortic dissection or other major vascular complications, aortic annulus rupture, device embolization, cardiac rupture or perforation, mitral regurgitation, acute myocardial infarction, arrhythmia, heart block or bradycardia requiring permanent pacemaker placement, congestive heart failure, respiratory failure, renal failure, pneumonia, infection, other late complications related to structural valve deterioration or migration, or other complications that might ultimately cause a temporary or permanent loss of functional independence or other long term morbidity.  The patient provides full informed consent for the procedure as described and all questions were answered.     Valentina Gu. Roxy Manns, MD

## 2015-05-29 ENCOUNTER — Inpatient Hospital Stay (HOSPITAL_COMMUNITY): Payer: Commercial Managed Care - HMO

## 2015-05-29 ENCOUNTER — Encounter (HOSPITAL_COMMUNITY): Payer: Self-pay | Admitting: Thoracic Surgery (Cardiothoracic Vascular Surgery)

## 2015-05-29 ENCOUNTER — Inpatient Hospital Stay (HOSPITAL_COMMUNITY): Payer: Commercial Managed Care - HMO | Admitting: Anesthesiology

## 2015-05-29 ENCOUNTER — Inpatient Hospital Stay (HOSPITAL_COMMUNITY)
Admission: RE | Admit: 2015-05-29 | Discharge: 2015-05-31 | DRG: 267 | Disposition: A | Discharge status: Home / Self Care | Payer: Commercial Managed Care - HMO | Source: Ambulatory Visit | Attending: Thoracic Surgery (Cardiothoracic Vascular Surgery) | Admitting: Thoracic Surgery (Cardiothoracic Vascular Surgery)

## 2015-05-29 ENCOUNTER — Encounter (HOSPITAL_COMMUNITY)
Admission: RE | Disposition: A | Discharge status: Home / Self Care | Payer: Self-pay | Source: Ambulatory Visit | Attending: Thoracic Surgery (Cardiothoracic Vascular Surgery)

## 2015-05-29 DIAGNOSIS — E785 Hyperlipidemia, unspecified: Secondary | ICD-10-CM | POA: Diagnosis present

## 2015-05-29 DIAGNOSIS — I350 Nonrheumatic aortic (valve) stenosis: Secondary | ICD-10-CM

## 2015-05-29 DIAGNOSIS — Z955 Presence of coronary angioplasty implant and graft: Secondary | ICD-10-CM | POA: Diagnosis not present

## 2015-05-29 DIAGNOSIS — I10 Essential (primary) hypertension: Secondary | ICD-10-CM | POA: Diagnosis not present

## 2015-05-29 DIAGNOSIS — I110 Hypertensive heart disease with heart failure: Secondary | ICD-10-CM | POA: Diagnosis not present

## 2015-05-29 DIAGNOSIS — N400 Enlarged prostate without lower urinary tract symptoms: Secondary | ICD-10-CM | POA: Diagnosis present

## 2015-05-29 DIAGNOSIS — K219 Gastro-esophageal reflux disease without esophagitis: Secondary | ICD-10-CM | POA: Diagnosis not present

## 2015-05-29 DIAGNOSIS — Z96652 Presence of left artificial knee joint: Secondary | ICD-10-CM | POA: Diagnosis present

## 2015-05-29 DIAGNOSIS — Z006 Encounter for examination for normal comparison and control in clinical research program: Secondary | ICD-10-CM | POA: Diagnosis not present

## 2015-05-29 DIAGNOSIS — Z7902 Long term (current) use of antithrombotics/antiplatelets: Secondary | ICD-10-CM | POA: Diagnosis not present

## 2015-05-29 DIAGNOSIS — D696 Thrombocytopenia, unspecified: Secondary | ICD-10-CM | POA: Diagnosis not present

## 2015-05-29 DIAGNOSIS — J9811 Atelectasis: Secondary | ICD-10-CM | POA: Diagnosis not present

## 2015-05-29 DIAGNOSIS — Z87891 Personal history of nicotine dependence: Secondary | ICD-10-CM | POA: Diagnosis not present

## 2015-05-29 DIAGNOSIS — Z79899 Other long term (current) drug therapy: Secondary | ICD-10-CM | POA: Diagnosis not present

## 2015-05-29 DIAGNOSIS — I352 Nonrheumatic aortic (valve) stenosis with insufficiency: Principal | ICD-10-CM | POA: Diagnosis present

## 2015-05-29 DIAGNOSIS — R0602 Shortness of breath: Secondary | ICD-10-CM | POA: Diagnosis present

## 2015-05-29 DIAGNOSIS — I2510 Atherosclerotic heart disease of native coronary artery without angina pectoris: Secondary | ICD-10-CM | POA: Diagnosis not present

## 2015-05-29 DIAGNOSIS — Z952 Presence of prosthetic heart valve: Secondary | ICD-10-CM

## 2015-05-29 DIAGNOSIS — Z954 Presence of other heart-valve replacement: Secondary | ICD-10-CM | POA: Diagnosis not present

## 2015-05-29 DIAGNOSIS — F0150 Vascular dementia without behavioral disturbance: Secondary | ICD-10-CM | POA: Diagnosis present

## 2015-05-29 DIAGNOSIS — Z7982 Long term (current) use of aspirin: Secondary | ICD-10-CM

## 2015-05-29 DIAGNOSIS — Z452 Encounter for adjustment and management of vascular access device: Secondary | ICD-10-CM | POA: Diagnosis not present

## 2015-05-29 DIAGNOSIS — I5032 Chronic diastolic (congestive) heart failure: Secondary | ICD-10-CM | POA: Diagnosis present

## 2015-05-29 HISTORY — DX: Presence of prosthetic heart valve: Z95.2

## 2015-05-29 HISTORY — PX: TEE WITHOUT CARDIOVERSION: SHX5443

## 2015-05-29 HISTORY — PX: TRANSCATHETER AORTIC VALVE REPLACEMENT, TRANSFEMORAL: SHX6400

## 2015-05-29 LAB — POCT I-STAT 3, ART BLOOD GAS (G3+)
Acid-base deficit: 4 mmol/L — ABNORMAL HIGH (ref 0.0–2.0)
Bicarbonate: 20.5 mEq/L (ref 20.0–24.0)
O2 SAT: 97 %
PCO2 ART: 33.7 mmHg — AB (ref 35.0–45.0)
PH ART: 7.386 (ref 7.350–7.450)
PO2 ART: 90 mmHg (ref 80.0–100.0)
Patient temperature: 35.5
TCO2: 22 mmol/L (ref 0–100)

## 2015-05-29 LAB — POCT I-STAT, CHEM 8
BUN: 16 mg/dL (ref 6–20)
BUN: 15 mg/dL (ref 6–20)
BUN: 15 mg/dL (ref 6–20)
BUN: 12 mg/dL (ref 6–20)
CALCIUM ION: 1.23 mmol/L (ref 1.13–1.30)
CALCIUM ION: 1.27 mmol/L (ref 1.13–1.30)
CHLORIDE: 108 mmol/L (ref 101–111)
CHLORIDE: 110 mmol/L (ref 101–111)
CHLORIDE: 109 mmol/L (ref 101–111)
CREATININE: 0.6 mg/dL — AB (ref 0.61–1.24)
CREATININE: 0.7 mg/dL (ref 0.61–1.24)
CREATININE: 0.6 mg/dL — AB (ref 0.61–1.24)
Calcium, Ion: 1.24 mmol/L (ref 1.13–1.30)
Calcium, Ion: 1.17 mmol/L (ref 1.13–1.30)
Chloride: 106 mmol/L (ref 101–111)
Creatinine, Ser: 0.6 mg/dL — ABNORMAL LOW (ref 0.61–1.24)
GLUCOSE: 99 mg/dL (ref 65–99)
Glucose, Bld: 107 mg/dL — ABNORMAL HIGH (ref 65–99)
Glucose, Bld: 131 mg/dL — ABNORMAL HIGH (ref 65–99)
Glucose, Bld: 136 mg/dL — ABNORMAL HIGH (ref 65–99)
HEMATOCRIT: 37 % — AB (ref 39.0–52.0)
HEMATOCRIT: 35 % — AB (ref 39.0–52.0)
HEMATOCRIT: 36 % — AB (ref 39.0–52.0)
HEMATOCRIT: 35 % — AB (ref 39.0–52.0)
HEMOGLOBIN: 11.9 g/dL — AB (ref 13.0–17.0)
Hemoglobin: 12.6 g/dL — ABNORMAL LOW (ref 13.0–17.0)
Hemoglobin: 11.9 g/dL — ABNORMAL LOW (ref 13.0–17.0)
Hemoglobin: 12.2 g/dL — ABNORMAL LOW (ref 13.0–17.0)
POTASSIUM: 3.9 mmol/L (ref 3.5–5.1)
POTASSIUM: 3.8 mmol/L (ref 3.5–5.1)
POTASSIUM: 3.8 mmol/L (ref 3.5–5.1)
Potassium: 3.8 mmol/L (ref 3.5–5.1)
SODIUM: 141 mmol/L (ref 135–145)
SODIUM: 144 mmol/L (ref 135–145)
Sodium: 142 mmol/L (ref 135–145)
Sodium: 142 mmol/L (ref 135–145)
TCO2: 24 mmol/L (ref 0–100)
TCO2: 26 mmol/L (ref 0–100)
TCO2: 24 mmol/L (ref 0–100)
TCO2: 22 mmol/L (ref 0–100)

## 2015-05-29 LAB — CREATININE, SERUM
Creatinine, Ser: 0.74 mg/dL (ref 0.61–1.24)
GFR calc Af Amer: >60 mL/min (ref >60)

## 2015-05-29 LAB — GLUCOSE, CAPILLARY
Glucose-Capillary: 116 mg/dL — ABNORMAL HIGH (ref 65–99)
Glucose-Capillary: 124 mg/dL — ABNORMAL HIGH (ref 65–99)
Glucose-Capillary: 98 mg/dL (ref 65–99)

## 2015-05-29 LAB — CBC
HCT: 38.3 % — ABNORMAL LOW (ref 39.0–52.0)
HEMATOCRIT: 36.1 % — AB (ref 39.0–52.0)
Hemoglobin: 12.8 g/dL — ABNORMAL LOW (ref 13.0–17.0)
Hemoglobin: 12.5 g/dL — ABNORMAL LOW (ref 13.0–17.0)
MCH: 30.5 pg (ref 26.0–34.0)
MCH: 31.6 pg (ref 26.0–34.0)
MCHC: 33.4 g/dL (ref 30.0–36.0)
MCHC: 34.6 g/dL (ref 30.0–36.0)
MCV: 91.4 fL (ref 78.0–100.0)
MCV: 91.2 fL (ref 78.0–100.0)
PLATELETS: 127 10*3/uL — AB (ref 150–400)
PLATELETS: 103 10*3/uL — AB (ref 150–400)
RBC: 4.19 MIL/uL — ABNORMAL LOW (ref 4.22–5.81)
RBC: 3.96 MIL/uL — ABNORMAL LOW (ref 4.22–5.81)
RDW: 14 % (ref 11.5–15.5)
RDW: 14 % (ref 11.5–15.5)
WBC: 4.1 10*3/uL (ref 4.0–10.5)
WBC: 4.8 10*3/uL (ref 4.0–10.5)

## 2015-05-29 LAB — POCT I-STAT 4, (NA,K, GLUC, HGB,HCT)
GLUCOSE: 113 mg/dL — AB (ref 65–99)
HEMATOCRIT: 35 % — AB (ref 39.0–52.0)
HEMOGLOBIN: 11.9 g/dL — AB (ref 13.0–17.0)
POTASSIUM: 3.5 mmol/L (ref 3.5–5.1)
SODIUM: 144 mmol/L (ref 135–145)

## 2015-05-29 LAB — MAGNESIUM: Magnesium: 2.6 mg/dL — ABNORMAL HIGH (ref 1.7–2.4)

## 2015-05-29 LAB — APTT: aPTT: 36 seconds (ref 24–37)

## 2015-05-29 LAB — PROTIME-INR
INR: 1.35 (ref 0.00–1.49)
Prothrombin Time: 16.8 seconds — ABNORMAL HIGH (ref 11.6–15.2)

## 2015-05-29 SURGERY — IMPLANTATION, AORTIC VALVE, TRANSCATHETER, FEMORAL APPROACH
Anesthesia: General | Site: Chest

## 2015-05-29 MED ORDER — NITROGLYCERIN IN D5W 200-5 MCG/ML-% IV SOLN
0.0000 ug/min | INTRAVENOUS | Status: DC
  Administered 2015-05-29: 5 ug/min via INTRAVENOUS

## 2015-05-29 MED ORDER — PROTAMINE SULFATE 10 MG/ML IV SOLN
INTRAVENOUS | Status: AC
  Filled 2015-05-29: qty 10

## 2015-05-29 MED ORDER — MAGNESIUM SULFATE 4 GM/100ML IV SOLN
INTRAVENOUS | Status: AC
  Administered 2015-05-29: 4 g via INTRAVENOUS
  Filled 2015-05-29: qty 100

## 2015-05-29 MED ORDER — SODIUM CHLORIDE 0.9 % IV SOLN
INTRAVENOUS | Status: DC | PRN
  Administered 2015-05-29: 1500 mL

## 2015-05-29 MED ORDER — FENTANYL CITRATE (PF) 250 MCG/5ML IJ SOLN
INTRAMUSCULAR | Status: AC
Start: 2015-05-29 — End: 2015-05-29
  Filled 2015-05-29: qty 5

## 2015-05-29 MED ORDER — HEPARIN SODIUM (PORCINE) 1000 UNIT/ML IJ SOLN
INTRAMUSCULAR | Status: DC | PRN
  Administered 2015-05-29: 16000 [IU] via INTRAVENOUS

## 2015-05-29 MED ORDER — POTASSIUM CHLORIDE 10 MEQ/50ML IV SOLN
10.0000 meq | INTRAVENOUS | Status: AC
  Administered 2015-05-29 (×3): 10 meq via INTRAVENOUS
  Filled 2015-05-29 (×2): qty 50

## 2015-05-29 MED ORDER — CARVEDILOL 12.5 MG PO TABS
ORAL_TABLET | ORAL | Status: AC
  Administered 2015-05-29: 12.5 mg
  Filled 2015-05-29: qty 1

## 2015-05-29 MED ORDER — SODIUM CHLORIDE 0.9 % IJ SOLN
INTRAMUSCULAR | Status: AC
  Filled 2015-05-29: qty 10

## 2015-05-29 MED ORDER — POTASSIUM CHLORIDE 10 MEQ/50ML IV SOLN
INTRAVENOUS | Status: AC
  Filled 2015-05-29: qty 50

## 2015-05-29 MED ORDER — PROTAMINE SULFATE 10 MG/ML IV SOLN
INTRAVENOUS | Status: DC | PRN
  Administered 2015-05-29: 30 mg via INTRAVENOUS
  Administered 2015-05-29: 20 mg via INTRAVENOUS
  Administered 2015-05-29: 40 mg via INTRAVENOUS
  Administered 2015-05-29: 70 mg via INTRAVENOUS

## 2015-05-29 MED ORDER — ROSUVASTATIN CALCIUM 10 MG PO TABS
10.0000 mg | ORAL_TABLET | Freq: Every day | ORAL | Status: DC
  Administered 2015-05-29 – 2015-05-31 (×3): 10 mg via ORAL
  Filled 2015-05-29 (×3): qty 1

## 2015-05-29 MED ORDER — ROCURONIUM BROMIDE 100 MG/10ML IV SOLN
INTRAVENOUS | Status: DC | PRN
  Administered 2015-05-29: 50 mg via INTRAVENOUS
  Administered 2015-05-29: 20 mg via INTRAVENOUS

## 2015-05-29 MED ORDER — CARVEDILOL 12.5 MG PO TABS: 12.5000 mg | ORAL_TABLET | Freq: Once | ORAL | Status: DC

## 2015-05-29 MED ORDER — ACETAMINOPHEN 500 MG PO TABS
1000.0000 mg | ORAL_TABLET | Freq: Four times a day (QID) | ORAL | Status: DC
  Administered 2015-05-29 – 2015-05-31 (×6): 1000 mg via ORAL
  Filled 2015-05-29 (×7): qty 2

## 2015-05-29 MED ORDER — IODIXANOL 320 MG/ML IV SOLN
INTRAVENOUS | Status: DC | PRN
  Administered 2015-05-29: 40.3 mL via INTRAVENOUS

## 2015-05-29 MED ORDER — LIDOCAINE HCL (CARDIAC) 20 MG/ML IV SOLN
INTRAVENOUS | Status: DC | PRN
  Administered 2015-05-29: 60 mg via INTRAVENOUS

## 2015-05-29 MED ORDER — ACETAMINOPHEN 160 MG/5ML PO SOLN: 1000.0000 mg | Freq: Four times a day (QID) | ORAL | Status: DC

## 2015-05-29 MED ORDER — SODIUM CHLORIDE 0.9 % IV SOLN: INTRAVENOUS | Status: DC

## 2015-05-29 MED ORDER — LACTATED RINGERS IV SOLN: 500.0000 mL | Freq: Once | INTRAVENOUS | Status: DC | PRN

## 2015-05-29 MED ORDER — LACTATED RINGERS IV SOLN
INTRAVENOUS | Status: DC | PRN
  Administered 2015-05-29 (×2): via INTRAVENOUS

## 2015-05-29 MED ORDER — HEPARIN SODIUM (PORCINE) 1000 UNIT/ML IJ SOLN
INTRAMUSCULAR | Status: AC
  Filled 2015-05-29: qty 2

## 2015-05-29 MED ORDER — PANTOPRAZOLE SODIUM 40 MG PO TBEC
40.0000 mg | DELAYED_RELEASE_TABLET | Freq: Every day | ORAL | Status: DC
  Administered 2015-05-29 – 2015-05-31 (×3): 40 mg via ORAL
  Filled 2015-05-29 (×3): qty 1

## 2015-05-29 MED ORDER — FINASTERIDE 5 MG PO TABS
5.0000 mg | ORAL_TABLET | Freq: Every day | ORAL | Status: DC
Start: 2015-05-29 — End: 2015-05-31
  Administered 2015-05-29 – 2015-05-31 (×3): 5 mg via ORAL
  Filled 2015-05-29 (×3): qty 1

## 2015-05-29 MED ORDER — INSULIN REGULAR BOLUS VIA INFUSION: 0.0000 [IU] | Freq: Three times a day (TID) | INTRAVENOUS | Status: DC

## 2015-05-29 MED ORDER — SUGAMMADEX SODIUM 500 MG/5ML IV SOLN
INTRAVENOUS | Status: AC
  Filled 2015-05-29: qty 5

## 2015-05-29 MED ORDER — SODIUM CHLORIDE 0.9% FLUSH: 3.0000 mL | INTRAVENOUS | Status: DC | PRN

## 2015-05-29 MED ORDER — CLOPIDOGREL BISULFATE 75 MG PO TABS
75.0000 mg | ORAL_TABLET | Freq: Every day | ORAL | Status: DC
  Administered 2015-05-29 – 2015-05-31 (×3): 75 mg via ORAL
  Filled 2015-05-29 (×3): qty 1

## 2015-05-29 MED ORDER — MEPERIDINE HCL 25 MG/ML IJ SOLN: 6.2500 mg | INTRAMUSCULAR | Status: DC | PRN

## 2015-05-29 MED ORDER — SODIUM CHLORIDE 0.9 % IV SOLN
1.0000 mL/kg/h | INTRAVENOUS | Status: DC
  Administered 2015-05-29: 1 mL/kg/h via INTRAVENOUS

## 2015-05-29 MED ORDER — ROCURONIUM BROMIDE 50 MG/5ML IV SOLN
INTRAVENOUS | Status: AC
  Filled 2015-05-29: qty 1

## 2015-05-29 MED ORDER — LACTATED RINGERS IV SOLN: INTRAVENOUS | Status: DC

## 2015-05-29 MED ORDER — CHLORHEXIDINE GLUCONATE 0.12 % MT SOLN
15.0000 mL | Freq: Two times a day (BID) | OROMUCOSAL | Status: DC
  Administered 2015-05-31: 15 mL via OROMUCOSAL
  Filled 2015-05-29: qty 15

## 2015-05-29 MED ORDER — SUGAMMADEX SODIUM 500 MG/5ML IV SOLN
INTRAVENOUS | Status: DC | PRN
  Administered 2015-05-29: 250 mg via INTRAVENOUS

## 2015-05-29 MED ORDER — SODIUM CHLORIDE 0.9 % IV SOLN: 250.0000 mL | INTRAVENOUS | Status: DC | PRN

## 2015-05-29 MED ORDER — ACETAMINOPHEN 160 MG/5ML PO SOLN
650.0000 mg | Freq: Once | ORAL | Status: AC
  Administered 2015-05-29: 650 mg
  Filled 2015-05-29: qty 20.3

## 2015-05-29 MED ORDER — POTASSIUM CHLORIDE 10 MEQ/50ML IV SOLN
10.0000 meq | INTRAVENOUS | Status: AC
  Administered 2015-05-29 (×3): 10 meq via INTRAVENOUS

## 2015-05-29 MED ORDER — ETOMIDATE 2 MG/ML IV SOLN
INTRAVENOUS | Status: DC | PRN
  Administered 2015-05-29: 12 mg via INTRAVENOUS

## 2015-05-29 MED ORDER — INSULIN ASPART 100 UNIT/ML ~~LOC~~ SOLN
0.0000 [IU] | SUBCUTANEOUS | Status: DC
  Administered 2015-05-29: 2 [IU] via SUBCUTANEOUS

## 2015-05-29 MED ORDER — PHENYLEPHRINE HCL 10 MG/ML IJ SOLN
10.0000 mg | INTRAVENOUS | Status: DC | PRN
  Administered 2015-05-29: 10 ug/min via INTRAVENOUS

## 2015-05-29 MED ORDER — EPHEDRINE SULFATE 50 MG/ML IJ SOLN
INTRAMUSCULAR | Status: AC
  Filled 2015-05-29: qty 1

## 2015-05-29 MED ORDER — DEXTROSE 5 % IV SOLN
1.5000 g | Freq: Two times a day (BID) | INTRAVENOUS | Status: AC
  Administered 2015-05-29 – 2015-05-31 (×4): 1.5 g via INTRAVENOUS
  Filled 2015-05-29 (×3): qty 1.5

## 2015-05-29 MED ORDER — ALBUMIN HUMAN 5 % IV SOLN
250.0000 mL | INTRAVENOUS | Status: DC | PRN
  Administered 2015-05-29: 250 mL via INTRAVENOUS

## 2015-05-29 MED ORDER — MIDAZOLAM HCL 2 MG/2ML IJ SOLN: 2.0000 mg | INTRAMUSCULAR | Status: DC | PRN

## 2015-05-29 MED ORDER — ACETAMINOPHEN 650 MG RE SUPP: 650.0000 mg | Freq: Once | RECTAL | Status: DC

## 2015-05-29 MED ORDER — FAMOTIDINE IN NACL 20-0.9 MG/50ML-% IV SOLN
20.0000 mg | Freq: Two times a day (BID) | INTRAVENOUS | Status: DC
  Administered 2015-05-29: 20 mg via INTRAVENOUS

## 2015-05-29 MED ORDER — ARTIFICIAL TEARS OP OINT
TOPICAL_OINTMENT | OPHTHALMIC | Status: DC | PRN
  Administered 2015-05-29: 1 via OPHTHALMIC

## 2015-05-29 MED ORDER — DEXTROSE 5 % IV SOLN: 0.0000 ug/min | INTRAVENOUS | Status: DC

## 2015-05-29 MED ORDER — OXYCODONE HCL 5 MG PO TABS
5.0000 mg | ORAL_TABLET | ORAL | Status: DC | PRN
  Administered 2015-05-29 – 2015-05-30 (×3): 5 mg via ORAL
  Filled 2015-05-29 (×3): qty 1

## 2015-05-29 MED ORDER — VANCOMYCIN HCL IN DEXTROSE 1-5 GM/200ML-% IV SOLN
1000.0000 mg | Freq: Once | INTRAVENOUS | Status: AC
  Administered 2015-05-29: 1000 mg via INTRAVENOUS
  Filled 2015-05-29: qty 200

## 2015-05-29 MED ORDER — 0.9 % SODIUM CHLORIDE (POUR BTL) OPTIME
TOPICAL | Status: DC | PRN
  Administered 2015-05-29: 4000 mL

## 2015-05-29 MED ORDER — FENTANYL CITRATE (PF) 100 MCG/2ML IJ SOLN
INTRAMUSCULAR | Status: DC | PRN
  Administered 2015-05-29: 150 ug via INTRAVENOUS

## 2015-05-29 MED ORDER — MAGNESIUM SULFATE 4 GM/100ML IV SOLN
4.0000 g | Freq: Once | INTRAVENOUS | Status: AC
  Administered 2015-05-29: 4 g via INTRAVENOUS

## 2015-05-29 MED ORDER — METOPROLOL TARTRATE 1 MG/ML IV SOLN
2.5000 mg | INTRAVENOUS | Status: DC | PRN
  Administered 2015-05-30: 5 mg via INTRAVENOUS
  Filled 2015-05-29: qty 5

## 2015-05-29 MED ORDER — SUCCINYLCHOLINE CHLORIDE 20 MG/ML IJ SOLN
INTRAMUSCULAR | Status: AC
  Filled 2015-05-29: qty 1

## 2015-05-29 MED ORDER — SERTRALINE HCL 50 MG PO TABS
50.0000 mg | ORAL_TABLET | Freq: Every morning | ORAL | Status: DC
Start: 2015-05-29 — End: 2015-05-31
  Administered 2015-05-29 – 2015-05-31 (×3): 50 mg via ORAL
  Filled 2015-05-29 (×3): qty 1

## 2015-05-29 MED ORDER — ONDANSETRON HCL 4 MG/2ML IJ SOLN
4.0000 mg | Freq: Four times a day (QID) | INTRAMUSCULAR | Status: DC | PRN
  Administered 2015-05-30: 4 mg via INTRAVENOUS
  Filled 2015-05-29: qty 2

## 2015-05-29 MED ORDER — ASPIRIN EC 81 MG PO TBEC
81.0000 mg | DELAYED_RELEASE_TABLET | Freq: Every day | ORAL | Status: DC
  Administered 2015-05-29 – 2015-05-31 (×3): 81 mg via ORAL
  Filled 2015-05-29 (×3): qty 1

## 2015-05-29 MED ORDER — PROTAMINE SULFATE 10 MG/ML IV SOLN
INTRAVENOUS | Status: AC
  Filled 2015-05-29: qty 25

## 2015-05-29 MED ORDER — PROPOFOL 10 MG/ML IV BOLUS
INTRAVENOUS | Status: AC
  Filled 2015-05-29: qty 20

## 2015-05-29 MED ORDER — MORPHINE SULFATE (PF) 2 MG/ML IV SOLN: 2.0000 mg | INTRAVENOUS | Status: DC | PRN

## 2015-05-29 MED ORDER — DONEPEZIL HCL 10 MG PO TABS
10.0000 mg | ORAL_TABLET | Freq: Every day | ORAL | Status: DC
  Administered 2015-05-29 – 2015-05-30 (×2): 10 mg via ORAL
  Filled 2015-05-29 (×2): qty 1

## 2015-05-29 MED ORDER — SODIUM CHLORIDE 0.9% FLUSH
3.0000 mL | Freq: Two times a day (BID) | INTRAVENOUS | Status: DC
  Administered 2015-05-29 – 2015-05-30 (×2): 3 mL via INTRAVENOUS

## 2015-05-29 MED FILL — Potassium Chloride Inj 2 mEq/ML: INTRAVENOUS | Qty: 40 | Status: AC

## 2015-05-29 MED FILL — Electrolyte-R (PH 7.4) Solution: INTRAVENOUS | Qty: 3000 | Status: AC

## 2015-05-29 MED FILL — Magnesium Sulfate Inj 50%: INTRAMUSCULAR | Qty: 10 | Status: AC

## 2015-05-29 MED FILL — Heparin Sodium (Porcine) Inj 1000 Unit/ML: INTRAMUSCULAR | Qty: 30 | Status: AC

## 2015-05-29 MED FILL — Sodium Chloride IV Soln 0.9%: INTRAVENOUS | Qty: 2000 | Status: AC

## 2015-05-29 SURGICAL SUPPLY — 93 items
ADAPTER UNIV SWAN GANZ BIP (ADAPTER) ×2 IMPLANT
ADAPTER UNV SWAN GANZ BIP (ADAPTER) ×2
ADH SKN CLS APL DERMABOND .7 (GAUZE/BANDAGES/DRESSINGS) ×2
ADPR CATH UNV NS SG CATH (ADAPTER) ×2
BAG BANDED W/RUBBER/TAPE 36X54 (MISCELLANEOUS) ×4 IMPLANT
BAG DECANTER FOR FLEXI CONT (MISCELLANEOUS) IMPLANT
BAG EQP BAND 135X91 W/RBR TAPE (MISCELLANEOUS) ×2
BAG SNAP BAND KOVER 36X36 (MISCELLANEOUS) ×8 IMPLANT
BLADE OSCILLATING /SAGITTAL (BLADE) IMPLANT
BLADE STERNUM SYSTEM 6 (BLADE) ×4 IMPLANT
BLADE SURG ROTATE 9660 (MISCELLANEOUS) IMPLANT
CABLE PACING FASLOC BIEGE (MISCELLANEOUS) ×4 IMPLANT
CABLE PACING FASLOC BLUE (MISCELLANEOUS) ×4 IMPLANT
CANNULA FEM VENOUS REMOTE 22FR (CANNULA) IMPLANT
CANNULA OPTISITE PERFUSION 16F (CANNULA) IMPLANT
CANNULA OPTISITE PERFUSION 18F (CANNULA) IMPLANT
CATH S G BIP PACING (SET/KITS/TRAYS/PACK) ×8 IMPLANT
CLIP TI MEDIUM 24 (CLIP) ×4 IMPLANT
CLIP TI WIDE RED SMALL 24 (CLIP) ×4 IMPLANT
COVER BACK TABLE 24X17X13 BIG (DRAPES) ×4 IMPLANT
COVER DOME SNAP 22 D (MISCELLANEOUS) ×4 IMPLANT
COVER MAYO STAND STRL (DRAPES) ×4 IMPLANT
COVER TABLE BACK 60X90 (DRAPES) ×4 IMPLANT
CRADLE DONUT ADULT HEAD (MISCELLANEOUS) ×4 IMPLANT
DERMABOND ADVANCED (GAUZE/BANDAGES/DRESSINGS) ×2
DERMABOND ADVANCED .7 DNX12 (GAUZE/BANDAGES/DRESSINGS) ×2 IMPLANT
DRAPE INCISE IOBAN 66X45 STRL (DRAPES) IMPLANT
DRAPE SLUSH MACHINE 52X66 (DRAPES) ×4 IMPLANT
DRAPE TABLE COVER HEAVY DUTY (DRAPES) ×4 IMPLANT
DRESSING OPSITE X SMALL 2X3 (GAUZE/BANDAGES/DRESSINGS) ×2 IMPLANT
DRSG TEGADERM 4X4.75 (GAUZE/BANDAGES/DRESSINGS) ×4 IMPLANT
ELECT REM PT RETURN 9FT ADLT (ELECTROSURGICAL) ×8
ELECTRODE REM PT RTRN 9FT ADLT (ELECTROSURGICAL) ×4 IMPLANT
FELT TEFLON 6X6 (MISCELLANEOUS) ×4 IMPLANT
FEMORAL VENOUS CANN RAP (CANNULA) IMPLANT
GAUZE SPONGE 4X4 12PLY STRL (GAUZE/BANDAGES/DRESSINGS) ×4 IMPLANT
GLOVE BIO SURGEON STRL SZ8 (GLOVE) ×8 IMPLANT
GLOVE EUDERMIC 7 POWDERFREE (GLOVE) ×8 IMPLANT
GLOVE ORTHO TXT STRL SZ7.5 (GLOVE) ×8 IMPLANT
GOWN STRL REUS W/ TWL LRG LVL3 (GOWN DISPOSABLE) ×6 IMPLANT
GOWN STRL REUS W/ TWL XL LVL3 (GOWN DISPOSABLE) ×12 IMPLANT
GOWN STRL REUS W/TWL LRG LVL3 (GOWN DISPOSABLE) ×12
GOWN STRL REUS W/TWL XL LVL3 (GOWN DISPOSABLE) ×24
GUIDEWIRE SAF TJ AMPL .035X180 (WIRE) ×4 IMPLANT
GUIDEWIRE SAFE TJ AMPLATZ EXST (WIRE) ×4 IMPLANT
GUIDEWIRE STRAIGHT .035 260CM (WIRE) ×4 IMPLANT
INSERT FOGARTY 61MM (MISCELLANEOUS) ×4 IMPLANT
INSERT FOGARTY SM (MISCELLANEOUS) ×8 IMPLANT
INSERT FOGARTY XLG (MISCELLANEOUS) IMPLANT
KIT BASIN OR (CUSTOM PROCEDURE TRAY) ×4 IMPLANT
KIT DILATOR VASC 18G NDL (KITS) IMPLANT
KIT HEART LEFT (KITS) ×4 IMPLANT
KIT ROOM TURNOVER OR (KITS) ×4 IMPLANT
KIT SUCTION CATH 14FR (SUCTIONS) ×8 IMPLANT
NDL PERC 18GX7CM (NEEDLE) ×2 IMPLANT
NEEDLE PERC 18GX7CM (NEEDLE) ×4 IMPLANT
NS IRRIG 1000ML POUR BTL (IV SOLUTION) ×12 IMPLANT
PACK AORTA (CUSTOM PROCEDURE TRAY) ×4 IMPLANT
PAD ARMBOARD 7.5X6 YLW CONV (MISCELLANEOUS) ×8 IMPLANT
PAD ELECT DEFIB RADIOL ZOLL (MISCELLANEOUS) ×4 IMPLANT
SET MICROPUNCTURE 5F STIFF (MISCELLANEOUS) ×4 IMPLANT
SHEATH AVANTI 11CM 8FR (MISCELLANEOUS) ×4 IMPLANT
SHEATH PINNACLE 6F 10CM (SHEATH) ×8 IMPLANT
SLEEVE REPOSITIONING LENGTH 30 (MISCELLANEOUS) ×4 IMPLANT
SPONGE GAUZE 4X4 12PLY STER LF (GAUZE/BANDAGES/DRESSINGS) ×2 IMPLANT
SPONGE LAP 4X18 X RAY DECT (DISPOSABLE) ×4 IMPLANT
STOPCOCK MORSE 400PSI 3WAY (MISCELLANEOUS) ×12 IMPLANT
SUT ETHIBOND X763 2 0 SH 1 (SUTURE) ×4 IMPLANT
SUT GORETEX CV 4 TH 22 36 (SUTURE) ×4 IMPLANT
SUT GORETEX CV4 TH-18 (SUTURE) ×12 IMPLANT
SUT GORETEX TH-18 36 INCH (SUTURE) ×8 IMPLANT
SUT MNCRL AB 3-0 PS2 18 (SUTURE) ×4 IMPLANT
SUT PROLENE 3 0 SH1 36 (SUTURE) IMPLANT
SUT PROLENE 4 0 RB 1 (SUTURE) ×4
SUT PROLENE 4-0 RB1 .5 CRCL 36 (SUTURE) ×2 IMPLANT
SUT PROLENE 5 0 C 1 36 (SUTURE) ×8 IMPLANT
SUT PROLENE 6 0 C 1 30 (SUTURE) ×8 IMPLANT
SUT SILK  1 MH (SUTURE) ×2
SUT SILK 1 MH (SUTURE) ×2 IMPLANT
SUT SILK 2 0 SH CR/8 (SUTURE) IMPLANT
SUT VIC AB 2-0 CT1 27 (SUTURE) ×4
SUT VIC AB 2-0 CT1 TAPERPNT 27 (SUTURE) ×2 IMPLANT
SUT VIC AB 2-0 CTX 36 (SUTURE) IMPLANT
SUT VIC AB 3-0 SH 8-18 (SUTURE) ×8 IMPLANT
SYR 30ML LL (SYRINGE) ×8 IMPLANT
SYR 50ML LL SCALE MARK (SYRINGE) ×4 IMPLANT
TOWEL OR 17X26 10 PK STRL BLUE (TOWEL DISPOSABLE) ×8 IMPLANT
TRANSDUCER W/STOPCOCK (MISCELLANEOUS) ×8 IMPLANT
TRAY FOLEY IC TEMP SENS 16FR (CATHETERS) ×4 IMPLANT
TUBING HIGH PRESSURE 120CM (CONNECTOR) ×4 IMPLANT
VALVE HEART TRANSCATH SZ3 29MM (Prosthesis & Implant Heart) ×2 IMPLANT
WIRE AMPLATZ SS-J .035X180CM (WIRE) ×4 IMPLANT
WIRE BENTSON .035X145CM (WIRE) ×4 IMPLANT

## 2015-05-29 NOTE — Op Note (Addendum)
CARDIOTHORACIC SURGERY OPERATIVE NOTE  Date of Procedure:  05/29/2015  Preoperative Diagnosis: Severe Aortic Stenosis   Postoperative Diagnosis: Same   Procedure:    Transcatheter Aortic Valve Replacement - Percutaneous Right Transfemoral Approach  Edwards Sapien 3 Transcatheter Heart Valve (size 29 mm, model # 9600TFX, serial # [id number])   Co-Surgeons:  Valentina Gu. Roxy Manns, MD and Lauree Chandler, MD  Anesthesiologist:  Suella Broad, MD  Echocardiographer:  Jenkins Rouge, MD  Pre-operative Echo Findings:  Severe aortic stenosis  Normal left ventricular systolic function  Post-operative Echo Findings:  No paravalvular leak  Normal left ventricular systolic function      DETAILS OF THE OPERATIVE PROCEDURE  The majority of the procedure is documented separately in a procedure note by Dr. Angelena Form.  Transcatheter aortic valve replacement was performed via percutaneous right transfemoral approach.  Right transfemoral access was obtained using double Perglide sutures placed for vascular control. Pre-deployment balloon valvuloplasty was not utilized. Valve deployment was uncomplicated and tolerated remarkably well from a hemodynamic standpoint.. Post deployment transesophageal echocardiogram revealed normal valve function with no paravalvular leak.  The right femoral sheath was removed uneventfully. Initially there was incomplete vascular control but excellent hemostasis was achieved with 30 minutes manual pressure on the groin following protamine administration. The patient was extubated in the operative room and transported to the surgical intensive care unit in stable condition.    Valentina Gu. Roxy Manns MD 05/29/2015 9:35 AM

## 2015-05-29 NOTE — Interval H&P Note (Signed)
History and Physical Interval Note:  05/29/2015 6:34 AM  [name]  has presented today for surgery, with the diagnosis of SEVERE AS  The various methods of treatment have been discussed with the patient and family. After consideration of risks, benefits and other options for treatment, the patient has consented to  Procedure(s): TRANSCATHETER AORTIC VALVE REPLACEMENT, TRANSFEMORAL (N/A) TRANSESOPHAGEAL ECHOCARDIOGRAM (TEE) (N/A) as a surgical intervention .  The patient's history has been reviewed, patient examined, no change in status, stable for surgery.  I have reviewed the patient's chart and labs.  Questions were answered to the patient's satisfaction.     Rexene Alberts

## 2015-05-29 NOTE — CV Procedure (Signed)
[facility]  TAVR OPERATIVE NOTE   Date of Procedure:  05/29/2015  Preoperative Diagnosis: Severe Aortic Stenosis   Postoperative Diagnosis: Same   Procedure:    Transcatheter Aortic Valve Replacement - Transfemoral Approach  Edwards Sapien 3 THV (size 29 mm, model #9600CM29A , serial # [id number])   Co-Surgeons:  Valentina Gu. Roxy Manns, MD and Lauree Chandler, MD  Anesthesiologist:  Smith Robert  Echocardiographer:  Johnsie Cancel  Pre-operative Echo Findings:  Severe aortic stenosis  Normal left ventricular systolic function  Post-operative Echo Findings:  No paravalvular leak  Normal left ventricular systolic function  BRIEF CLINICAL NOTE AND INDICATIONS FOR SURGERY  82 year old gentleman with a history of aortic stenosis, coronary artery disease s/p PCI and stenting of the LAD and LCX in 2005 and PDA in 2009, hypertension and mild dementia. He was found to have a heart murmur and echocardiogram in 08/2011 showed moderate AS with a mean gradient of 25 mm Hg. He has been followed by Dr. Radford Pax with serial echos and has had chronic shortness of breath for several years that has progressed to the point where he is getting short of breath walking across the room. He had a negative nuclear stress test in 2015. Echo has shown progression of his AS with a mean gradient of 32 mm Hg in 04/2014. He underwent a pulmonary evaluation by Dr. Gwenette Greet in 05/2014 and PFT's did not show any significant COPD and no pulmonary reason for his progressive exertional dyspnea. He underwent R/L heart cath on 04/19/2015 showing non-obstructive coronary disease with patent stents and a mean AV gradient of 49.3 mm Hg and a peak to peak of 56 mm Hg with an LVEF of 60-65%. He had a follow up echo on 05/09/2015 that was read as showing a mean gradient of only 25 mm Hg with a peak velocity of 344 cm/s. He has undergone CT scans demonstrating suitable pelvic artery access for TAVR.   During the course of the patient's  preoperative work up they have been evaluated comprehensively by a multidisciplinary team of specialists coordinated through the [facility] in the [facility].  They have been demonstrated to suffer from symptomatic severe aortic stenosis as noted above. The patient has been counseled extensively as to the relative risks and benefits of all options for the treatment of severe aortic stenosis including long term medical therapy, conventional surgery for aortic valve replacement, and transcatheter aortic valve replacement.  The patient has been independently evaluated by two cardiac surgeons including Dr Roxy Manns and Dr. Cyndia Bent, and they are felt to be at high risk for conventional surgical aortic valve replacement. Both surgeons indicated the patient would be a poor candidate for conventional surgery. Based upon review of all of the patient's preoperative diagnostic tests they are felt to be candidate for transcatheter aortic valve replacement using the transfemoral approach as an alternative to high risk conventional surgery.    Following the decision to proceed with transcatheter aortic valve replacement, a discussion has been held regarding what types of management strategies would be attempted intraoperatively in the event of life-threatening complications, including whether or not the patient would be considered a candidate for the use of cardiopulmonary bypass and/or conversion to open sternotomy for attempted surgical intervention.  The patient has been advised of a variety of complications that might develop peculiar to this approach including but not limited to risks of death, stroke, paravalvular leak, aortic dissection or other major vascular complications, aortic  annulus rupture, device embolization, cardiac rupture or perforation, acute myocardial infarction, arrhythmia, heart block or bradycardia requiring permanent pacemaker placement, congestive heart  failure, respiratory failure, renal failure, pneumonia, infection, other late complications related to structural valve deterioration or migration, or other complications that might ultimately cause a temporary or permanent loss of functional independence or other long term morbidity.  The patient provides full informed consent for the procedure as described and all questions were answered preoperatively.   DETAILS OF THE OPERATIVE PROCEDURE  PREPARATION:   The patient is brought to the operating room on the above mentioned date and central monitoring was established by the anesthesia team including placement of Swan-Ganz catheter and radial arterial line. The patient is placed in the supine position on the operating table.  Intravenous antibiotics are administered. General endotracheal anesthesia is induced uneventfully. A Foley catheter is placed.  Baseline transesophageal echocardiogram was performed. The patient's chest, abdomen, both groins, and both lower extremities are prepared and draped in a sterile manner. A time out procedure is performed.   PERIPHERAL ACCESS:   Using the modified Seldinger technique, femoral arterial and venous access were obtained with placement of 6 Fr sheaths on the left side.  A pigtail diagnostic catheter was passed through the femoral arterial sheath under fluoroscopic guidance into the aortic root.  A temporary transvenous pacemaker catheter was passed through the femoral venous sheath under fluoroscopic guidance into the right ventricle.  The pacemaker was tested to ensure stable lead placement and pacemaker capture. Aortic root angiography was performed in order to determine the optimal angiographic angle for valve deployment.  TRANSFEMORAL ACCESS:  Access obtained in the right femoral artery. Double Perglide placement before sheath insertion.  The patient was heparinized systemically and ACT verified > 250 seconds.    A 16 Fr transfemoral E-sheath was  introduced into the right femoral artery after progressively dilating over an Amplatz superstiff wire. An AL-1 catheter was used to direct a straight-tip exchange length wire across the native aortic valve into the left ventricle. This was exchanged out for a pigtail catheter and position was confirmed in the LV apex. Simultaneous LV and Ao pressures were recorded.  The pigtail catheter was then exchanged for an Amplatz Extra-stiff wire in the LV apex.    TRANSCATHETER HEART VALVE DEPLOYMENT:  An Edwards Sapien 3 THV (size 9mm) was prepared and crimped per manufacturer's guidelines, and the proper orientation of the valve is confirmed on the [employer] delivery system. The valve was advanced through the introducer sheath using normal technique until in an appropriate position in the abdominal aorta beyond the sheath tip. The balloon was then retracted and using the fine-tuning wheel was centered on the valve. The valve was then advanced across the aortic arch using appropriate flexion of the catheter. The valve was carefully positioned across the aortic valve annulus. The Commander catheter was retracted using normal technique. Once final position of the valve has been confirmed by angiographic assessment, the valve is deployed while temporarily holding ventilation and during rapid ventricular pacing to maintain systolic blood pressure < 50 mmHg and pulse pressure < 10 mmHg. The balloon inflation is held for >3 seconds after reaching full deployment volume. Once the balloon has fully deflated the balloon is retracted into the ascending aorta and valve function is assessed using TEE. There is felt to be no paravalvular leak and no central aortic insufficiency.  The patient's hemodynamic recovery following valve deployment is good.  The deployment balloon and guidewire are both  removed. Echo demostrated acceptable post-procedural gradients, stable mitral valve function, and no AI.   PROCEDURE COMPLETION:   The sheath was then removed and double Proglide device is sutured down. Protamine was administered. . The temporary pacemaker, pigtail catheters and femoral sheaths were removed with manual pressure used for hemostasis.   The patient tolerated the procedure well and is transported to the surgical intensive care in stable condition. There were no immediate intraoperative complications. All sponge instrument and needle counts are verified correct at completion of the operation.   No blood products were administered during the operation.  The patient received a total of 40 mL of intravenous contrast during the procedure.  MCALHANY,CHRISTOPHER MD 05/29/2015 7:50 AM

## 2015-05-29 NOTE — Anesthesia Postprocedure Evaluation (Signed)
Anesthesia Post Note  Patient: Hamed R Mckown  Procedure(s) Performed: Procedure(s) (LRB): TRANSCATHETER AORTIC VALVE REPLACEMENT, TRANSFEMORAL (N/A) TRANSESOPHAGEAL ECHOCARDIOGRAM (TEE) (N/A)  Patient location during evaluation: PACU Anesthesia Type: General Level of consciousness: awake and alert Pain management: pain level controlled Vital Signs Assessment: post-procedure vital signs reviewed and stable Respiratory status: spontaneous breathing, nonlabored ventilation, respiratory function stable and patient connected to nasal cannula oxygen Cardiovascular status: blood pressure returned to baseline and stable Postop Assessment: no signs of nausea or vomiting Anesthetic complications: no    Last Vitals:  Filed Vitals:   05/29/15 1145 05/29/15 1200  BP: 119/70 124/67  Pulse: 56 62  Temp: 35.5 C 35.5 C  Resp: 11 13    Last Pain:  Filed Vitals:   05/29/15 1207  PainSc: Asleep                 Effie Berkshire

## 2015-05-29 NOTE — Transfer of Care (Signed)
Immediate Anesthesia Transfer of Care Note  Patient: [name]  Procedure(s) Performed: Procedure(s): TRANSCATHETER AORTIC VALVE REPLACEMENT, TRANSFEMORAL (N/A) TRANSESOPHAGEAL ECHOCARDIOGRAM (TEE) (N/A)  Patient Location: PACU  Anesthesia Type:General  Level of Consciousness: awake, alert  and oriented  Airway & Oxygen Therapy: Patient Spontanous Breathing and Patient connected to nasal cannula oxygen  Post-op Assessment: Report given to RN, Post -op Vital signs reviewed and stable and Patient moving all extremities X 4  Post vital signs: Reviewed and stable  Last Vitals: There were no vitals filed for this visit.  Complications: No apparent anesthesia complications

## 2015-05-29 NOTE — Progress Notes (Signed)
TCTS BRIEF PROGRESS NOTE  Day of Surgery  S/P Procedure(s) (LRB): TRANSCATHETER AORTIC VALVE REPLACEMENT, TRANSFEMORAL (N/A) TRANSESOPHAGEAL ECHOCARDIOGRAM (TEE) (N/A)   Patient awake and alert in PACU NSR w/ stable hemodynamics Breathing comfortably w/ O2 sats 99% on 2 L/min via [place] UOP marginal Groin looks okay, distal pulses intact Labs okay CXR w/ mild bibasilar atelectasis  Plan: Continue routine early postop  Rexene Alberts, MD 05/29/2015 11:32 AM

## 2015-05-29 NOTE — Progress Notes (Signed)
Echocardiogram Echocardiogram Transesophageal has been performed.  [name] 05/29/2015, 9:37 AM

## 2015-05-29 NOTE — Anesthesia Procedure Notes (Addendum)
Procedure Name: Intubation Date/Time: 05/29/2015 7:45 AM Performed by: Mariea Clonts Pre-anesthesia Checklist: Emergency Drugs available, Patient identified, Timeout performed, Suction available and Patient being monitored Patient Re-evaluated:Patient Re-evaluated prior to inductionOxygen Delivery Method: Circle system utilized Preoxygenation: Pre-oxygenation with 100% oxygen Intubation Type: IV induction Ventilation: Mask ventilation without difficulty and Oral airway inserted - appropriate to patient size Laryngoscope Size: Sabra Heck and 3 Grade View: Grade II Tube type: Oral Tube size: 8.0 mm Number of attempts: 1 Airway Equipment and Method: LTA kit utilized Placement Confirmation: ETT inserted through vocal cords under direct vision,  positive ETCO2 and breath sounds checked- equal and bilateral Tube secured with: Tape Dental Injury: Teeth and Oropharynx as per pre-operative assessment     Central Venous Catheter Insertion Performed by: anesthesiologist 05/29/2015 7:00 AM Patient location: Pre-op. Preanesthetic checklist: patient identified, IV checked, site marked, risks and benefits discussed, surgical consent, monitors and equipment checked, pre-op evaluation, timeout performed and anesthesia consent Position: Trendelenburg Lidocaine 1% used for infiltration Landmarks identified and Seldinger technique used Catheter size: 8.5 Fr Central line was placed.Sheath introducer Swan type and PA catheter depth:thermodilation and 50PA Cath depth:50 Procedure performed using ultrasound guided technique. Attempts: 1 Following insertion, line sutured and dressing applied. Post procedure assessment: blood return through all ports, free fluid flow and no air. Patient tolerated the procedure well with no immediate complications.    Central Venous Catheter Insertion Performed by: anesthesiologist 05/29/2015 7:00 AM Patient location: Pre-op. Preanesthetic checklist: patient  identified, IV checked, site marked, risks and benefits discussed, surgical consent, monitors and equipment checked, pre-op evaluation, timeout performed and anesthesia consent Landmarks identified PA cath was placed.Swan type and PA catheter depth:thermodilationProcedure performed using ultrasound guided technique. Attempts: 1 Patient tolerated the procedure well with no immediate complications.

## 2015-05-30 ENCOUNTER — Encounter (HOSPITAL_COMMUNITY): Payer: Self-pay | Admitting: Cardiovascular Disease

## 2015-05-30 ENCOUNTER — Inpatient Hospital Stay (HOSPITAL_COMMUNITY): Payer: Commercial Managed Care - HMO

## 2015-05-30 DIAGNOSIS — Z954 Presence of other heart-valve replacement: Secondary | ICD-10-CM

## 2015-05-30 LAB — BASIC METABOLIC PANEL
ANION GAP: 6 (ref 5–15)
Anion gap: 8 (ref 5–15)
BUN: 9 mg/dL (ref 6–20)
BUN: 9 mg/dL (ref 6–20)
CHLORIDE: 110 mmol/L (ref 101–111)
CHLORIDE: 107 mmol/L (ref 101–111)
CO2: 24 mmol/L (ref 22–32)
CO2: 26 mmol/L (ref 22–32)
CREATININE: 0.89 mg/dL (ref 0.61–1.24)
Calcium: 8.2 mg/dL — ABNORMAL LOW (ref 8.9–10.3)
Calcium: 8.7 mg/dL — ABNORMAL LOW (ref 8.9–10.3)
Creatinine, Ser: 0.77 mg/dL (ref 0.61–1.24)
GFR calc Af Amer: >60 mL/min (ref >60)
GFR calc non Af Amer: >60 mL/min (ref >60)
GFR calc non Af Amer: >60 mL/min (ref >60)
Glucose, Bld: 118 mg/dL — ABNORMAL HIGH (ref 65–99)
Glucose, Bld: 121 mg/dL — ABNORMAL HIGH (ref 65–99)
POTASSIUM: 4 mmol/L (ref 3.5–5.1)
POTASSIUM: 3.9 mmol/L (ref 3.5–5.1)
SODIUM: 140 mmol/L (ref 135–145)
Sodium: 141 mmol/L (ref 135–145)

## 2015-05-30 LAB — CBC
HCT: 39.2 % (ref 39.0–52.0)
HEMATOCRIT: 37.1 % — AB (ref 39.0–52.0)
HEMOGLOBIN: 12.4 g/dL — AB (ref 13.0–17.0)
HEMOGLOBIN: 13.1 g/dL (ref 13.0–17.0)
MCH: 30.8 pg (ref 26.0–34.0)
MCH: 30.8 pg (ref 26.0–34.0)
MCHC: 33.4 g/dL (ref 30.0–36.0)
MCHC: 33.4 g/dL (ref 30.0–36.0)
MCV: 92.3 fL (ref 78.0–100.0)
MCV: 92 fL (ref 78.0–100.0)
Platelets: 113 10*3/uL — ABNORMAL LOW (ref 150–400)
Platelets: 120 10*3/uL — ABNORMAL LOW (ref 150–400)
RBC: 4.02 MIL/uL — AB (ref 4.22–5.81)
RBC: 4.26 MIL/uL (ref 4.22–5.81)
RDW: 13.9 % (ref 11.5–15.5)
RDW: 13.9 % (ref 11.5–15.5)
WBC: 5 10*3/uL (ref 4.0–10.5)
WBC: 5.1 10*3/uL (ref 4.0–10.5)

## 2015-05-30 LAB — GLUCOSE, CAPILLARY
GLUCOSE-CAPILLARY: 145 mg/dL — AB (ref 65–99)
Glucose-Capillary: 99 mg/dL (ref 65–99)

## 2015-05-30 LAB — MAGNESIUM
MAGNESIUM: 2.2 mg/dL (ref 1.7–2.4)
MAGNESIUM: 2.2 mg/dL (ref 1.7–2.4)

## 2015-05-30 MED ORDER — SODIUM CHLORIDE 0.9 % IV SOLN
250.0000 mL | INTRAVENOUS | Status: DC | PRN
  Administered 2015-05-31: 500 mL via INTRAVENOUS

## 2015-05-30 MED ORDER — FUROSEMIDE 40 MG PO TABS
40.0000 mg | ORAL_TABLET | Freq: Every day | ORAL | Status: DC
  Administered 2015-05-30 – 2015-05-31 (×2): 40 mg via ORAL
  Filled 2015-05-30 (×2): qty 1

## 2015-05-30 MED ORDER — LOSARTAN POTASSIUM 50 MG PO TABS
50.0000 mg | ORAL_TABLET | Freq: Every day | ORAL | Status: DC
  Administered 2015-05-30 – 2015-05-31 (×2): 50 mg via ORAL
  Filled 2015-05-30 (×2): qty 1

## 2015-05-30 MED ORDER — AMLODIPINE BESYLATE 5 MG PO TABS
5.0000 mg | ORAL_TABLET | Freq: Every day | ORAL | Status: DC
  Administered 2015-05-30 – 2015-05-31 (×2): 5 mg via ORAL
  Filled 2015-05-30 (×2): qty 1

## 2015-05-30 MED ORDER — POTASSIUM CHLORIDE CRYS ER 20 MEQ PO TBCR
20.0000 meq | EXTENDED_RELEASE_TABLET | Freq: Every day | ORAL | Status: DC
  Administered 2015-05-30 – 2015-05-31 (×2): 20 meq via ORAL
  Filled 2015-05-30 (×2): qty 1

## 2015-05-30 MED ORDER — CARVEDILOL 12.5 MG PO TABS
12.5000 mg | ORAL_TABLET | Freq: Two times a day (BID) | ORAL | Status: DC
  Administered 2015-05-30 – 2015-05-31 (×3): 12.5 mg via ORAL
  Filled 2015-05-30 (×3): qty 1

## 2015-05-30 MED ORDER — SODIUM CHLORIDE 0.9% FLUSH: 3.0000 mL | INTRAVENOUS | Status: DC | PRN

## 2015-05-30 MED ORDER — SODIUM CHLORIDE 0.9% FLUSH
3.0000 mL | Freq: Two times a day (BID) | INTRAVENOUS | Status: DC
  Administered 2015-05-31: 3 mL via INTRAVENOUS

## 2015-05-30 MED ORDER — MOVING RIGHT ALONG BOOK
Freq: Once | Status: DC
  Filled 2015-05-30: qty 1

## 2015-05-30 MED FILL — Insulin Regular (Human) Inj 100 Unit/ML: INTRAMUSCULAR | Qty: 250 | Status: AC

## 2015-05-30 NOTE — Progress Notes (Signed)
CT surgery p.m. Rounds  Stable day-able to ambulate in hallway No groin hematoma Tolerating regular diet Saturation 94% on room air

## 2015-05-30 NOTE — Progress Notes (Signed)
     SUBJECTIVE: No complaints.   Tele: sinus  BP 151/81 mmHg  Pulse 49  Temp(Src) 97.9 F (36.6 C) (Core (Comment))  Resp 20  Wt 261 lb 3.9 oz (118.5 kg)  SpO2 94%  Intake/Output Summary (Last 24 hours) at 05/30/15 0710 Last data filed at 05/30/15 0645  Gross per 24 hour  Intake 3267.27 ml  Output   2575 ml  Net 692.27 ml    PHYSICAL EXAM General: Well developed, well nourished, in no acute distress. Alert and oriented x 3.  Psych:  Good affect, responds appropriately Neck: No JVD. No masses noted.  Lungs: Clear bilaterally with no wheezes or rhonci noted.  Heart: RRR with no murmurs noted. Abdomen: Bowel sounds are present. Soft, non-tender.  Extremities: No lower extremity edema. Both groins without hematoma. Distal pulses intact.   LABS: Basic Metabolic Panel:  Recent Labs  05/29/15 1715 05/29/15 1721 05/30/15 0420  NA  --  142 140  K  --  3.8 4.0  CL  --  109 110  CO2  --   --  24  GLUCOSE  --  99 118*  BUN  --  12 9  CREATININE 0.74 0.60* 0.77  CALCIUM  --   --  8.2*  MG 2.6*  --  2.2   CBC:  Recent Labs  05/29/15 1715 05/29/15 1721 05/30/15 0420  WBC 4.8  --  5.0  HGB 12.5* 11.9* 12.4*  HCT 36.1* 35.0* 37.1*  MCV 91.2  --  92.3  PLT 103*  --  113*   Current Meds: . acetaminophen  1,000 mg Oral Q6H  . amLODipine  5 mg Oral Daily  . aspirin EC  81 mg Oral Daily  . carvedilol  12.5 mg Oral BID WC  . cefUROXime (ZINACEF)  IV  1.5 g Intravenous Q12H  . chlorhexidine  15 mL Mouth/Throat BID  . clopidogrel  75 mg Oral Daily  . donepezil  10 mg Oral QHS  . finasteride  5 mg Oral Daily  . furosemide  40 mg Oral Daily  . losartan  50 mg Oral Daily  . moving right along book   Does not apply Once  . pantoprazole  40 mg Oral Daily  . potassium chloride SA  20 mEq Oral Daily  . rosuvastatin  10 mg Oral Daily  . sertraline  50 mg Oral q morning - 10a  . sodium chloride flush  3 mL Intravenous Q12H  . sodium chloride flush  3 mL Intravenous  Q12H     ASSESSMENT AND PLAN:  1. Severe aortic valve stenosis: POD #1 TAVR. He is doing well this am. BP stable. Labs reviewed and ok. He is on ASA and Plavix.  Echo today. Will mobilize today. Remove arterial line and SWAN. Remove foley. Wean NTG drip to off. Transfer to telemetry unit this am.   2. HTN: Will wean NTG drip off this am. Resume home anti-hypertensive meds this am.   Aritza Brunet  4/19/20177:10 AM

## 2015-05-30 NOTE — Progress Notes (Signed)
      [address]       [place] [address]             [phone]        CARDIOTHORACIC SURGERY PROGRESS NOTE   R1 Day Post-Op Procedure(s) (LRB): TRANSCATHETER AORTIC VALVE REPLACEMENT, TRANSFEMORAL (N/A) TRANSESOPHAGEAL ECHOCARDIOGRAM (TEE) (N/A)  Subjective: Looks good.  Denies pain or SOB  Objective: Vital signs: BP Readings from Last 1 Encounters:  05/30/15 115/70   Pulse Readings from Last 1 Encounters:  05/30/15 65   Resp Readings from Last 1 Encounters:  05/30/15 17   Temp Readings from Last 1 Encounters:  05/29/15 97.9 F (36.6 C)     Hemodynamics: PAP: (23-35)/(8-18) 31/13 mmHg CO:  [4.2 L/min-7.1 L/min] 7.1 L/min CI:  [1.8 L/min/m2-2.9 L/min/m2] 2.9 L/min/m2  Physical Exam:  Rhythm:   sinus  Breath sounds: clear  Heart sounds:  RRR w/out murmur  Incisions:  Both groins look okay  Abdomen:  soft  Extremities:  warm   Intake/Output from previous day: 04/18 0701 - 04/19 0700 In: 3271 [I.V.:2291; IV Piggyback:980] Out: 2575 [Urine:2475; Emesis/NG output:100] Intake/Output this shift:    Lab Results:  CBC: Recent Labs  05/29/15 1715 05/29/15 1721 05/30/15 0420  WBC 4.8  --  5.0  HGB 12.5* 11.9* 12.4*  HCT 36.1* 35.0* 37.1*  PLT 103*  --  113*    BMET:  Recent Labs  05/29/15 1721 05/30/15 0420  NA 142 140  K 3.8 4.0  CL 109 110  CO2  --  24  GLUCOSE 99 118*  BUN 12 9  CREATININE 0.60* 0.77  CALCIUM  --  8.2*     PT/INR:   Recent Labs  05/29/15 1020  LABPROT 16.8*  INR 1.35    CBG (last 3)   Recent Labs  05/29/15 1930 05/29/15 2341 05/30/15 0348  GLUCAP 124* 116* 99    ABG    Component Value Date/Time   PHART 7.386 05/29/2015 1025   PCO2ART 33.7* 05/29/2015 1025   PO2ART 90.0 05/29/2015 1025   HCO3 20.5 05/29/2015 1025   TCO2 22 05/29/2015 1721   ACIDBASEDEF 4.0* 05/29/2015 1025   O2SAT 97.0 05/29/2015 1025    CXR: PORTABLE CHEST 1 VIEW  COMPARISON:  05/29/2015  FINDINGS: Swan-Ganz catheter has been removed. Right jugular sheath remains in place. No pneumothorax. Aortic valve replacement unchanged in position  Hypoventilation with bibasilar atelectasis. No significant edema or effusion.  IMPRESSION: Swan-Ganz catheter removed  Progression of bibasilar atelectasis since yesterday.   Electronically Signed  By: Franchot Gallo M.D.  On: 05/30/2015 07:21  Assessment/Plan: S/P Procedure(s) (LRB): TRANSCATHETER AORTIC VALVE REPLACEMENT, TRANSFEMORAL (N/A) TRANSESOPHAGEAL ECHOCARDIOGRAM (TEE) (N/A)  Doing well POD1 Maintaining NSR w/ stable BP - still on NTG drip for hypertension   Resume pre-op meds for hypertension  Resume DAPT using ASA + Plavix  Mobilize  Gentle diuresis  Transfer 2 W  Routine POD1 ECHO  Anticipate possible d/c home 1-2 days  I spent in excess of 15 minutes during the conduct of this hospital encounter and >50% of this time involved direct face-to-face encounter with the patient for counseling and/or coordination of their care.   Rexene Alberts, MD 05/30/2015 7:33 AM

## 2015-05-31 ENCOUNTER — Other Ambulatory Visit: Payer: Self-pay | Admitting: *Deleted

## 2015-05-31 ENCOUNTER — Inpatient Hospital Stay (HOSPITAL_COMMUNITY): Payer: Commercial Managed Care - HMO

## 2015-05-31 DIAGNOSIS — I350 Nonrheumatic aortic (valve) stenosis: Secondary | ICD-10-CM

## 2015-05-31 DIAGNOSIS — Z952 Presence of prosthetic heart valve: Secondary | ICD-10-CM

## 2015-05-31 LAB — CBC
HEMATOCRIT: 36.2 % — AB (ref 39.0–52.0)
HEMOGLOBIN: 12 g/dL — AB (ref 13.0–17.0)
MCH: 30.5 pg (ref 26.0–34.0)
MCHC: 33.1 g/dL (ref 30.0–36.0)
MCV: 91.9 fL (ref 78.0–100.0)
Platelets: 109 10*3/uL — ABNORMAL LOW (ref 150–400)
RBC: 3.94 MIL/uL — ABNORMAL LOW (ref 4.22–5.81)
RDW: 13.8 % (ref 11.5–15.5)
WBC: 4.4 10*3/uL (ref 4.0–10.5)

## 2015-05-31 LAB — ECHOCARDIOGRAM COMPLETE: WEIGHTICAEL: 4067.05 [oz_av]

## 2015-05-31 LAB — BASIC METABOLIC PANEL
Anion gap: 7 (ref 5–15)
BUN: 10 mg/dL (ref 6–20)
CHLORIDE: 109 mmol/L (ref 101–111)
CO2: 24 mmol/L (ref 22–32)
CREATININE: 0.81 mg/dL (ref 0.61–1.24)
Calcium: 8.2 mg/dL — ABNORMAL LOW (ref 8.9–10.3)
GFR calc Af Amer: >60 mL/min (ref >60)
GFR calc non Af Amer: >60 mL/min (ref >60)
Glucose, Bld: 117 mg/dL — ABNORMAL HIGH (ref 65–99)
Potassium: 3.7 mmol/L (ref 3.5–5.1)
SODIUM: 140 mmol/L (ref 135–145)

## 2015-05-31 MED ORDER — POTASSIUM CHLORIDE CRYS ER 20 MEQ PO TBCR: 20.0000 meq | EXTENDED_RELEASE_TABLET | ORAL | Status: DC | PRN

## 2015-05-31 NOTE — Progress Notes (Signed)
1415:  Patient discharged via wheelchair to car.  No problems and/or distress noted.  Jenifer Dorinne Graeff RN

## 2015-05-31 NOTE — Progress Notes (Signed)
CARDIAC REHAB PHASE I   Pt for discharge today, in ICU. Pt has been ambulating with staff with no complaints. Cardiac surgery discharge education completed. Reviewed IS, restrictions, activity progression, exercise, heart healthy diet, sodium restrictions, daily weights and phase 2 cardiac rehab. Pt verbalized understanding. Pt agrees to phase 2 cardiac rehab referral, will send to [facility] per pt request. Pt in recliner, call bell within reach, awaiting discharge.  Raymer, RN, BSN 05/31/2015 1:54 PM

## 2015-05-31 NOTE — Progress Notes (Addendum)
      [address]       [place] [address]             [phone]        CARDIOTHORACIC SURGERY PROGRESS NOTE   R2 Days Post-Op Procedure(s) (LRB): TRANSCATHETER AORTIC VALVE REPLACEMENT, TRANSFEMORAL (N/A) TRANSESOPHAGEAL ECHOCARDIOGRAM (TEE) (N/A)  Subjective: Looks great.  Feels much better than PTA.  No complaints at all.  Objective: Vital signs: BP Readings from Last 1 Encounters:  05/31/15 175/91   Pulse Readings from Last 1 Encounters:  05/31/15 63   Resp Readings from Last 1 Encounters:  05/31/15 18   Temp Readings from Last 1 Encounters:  05/31/15 97.9 F (36.6 C) Oral    Hemodynamics:    Physical Exam:  Rhythm:   sinus  Breath sounds: clear  Heart sounds:  RRR w/out murmur  Incisions:  n/a  Abdomen:  soft  Extremities:  warm   Intake/Output from previous day: 04/19 0701 - 04/20 0700 In: 976 [P.O.:840; I.V.:36; IV Piggyback:100] Out: 3775 [Urine:3775] Intake/Output this shift: Total I/O In: 150 [P.O.:150] Out: -   Lab Results:  CBC: Recent Labs  05/30/15 1621 05/31/15 0248  WBC 5.1 4.4  HGB 13.1 12.0*  HCT 39.2 36.2*  PLT 120* 109*    BMET:  Recent Labs  05/30/15 1621 05/31/15 0248  NA 141 140  K 3.9 3.7  CL 107 109  CO2 26 24  GLUCOSE 121* 117*  BUN 9 10  CREATININE 0.89 0.81  CALCIUM 8.7* 8.2*     PT/INR:   Recent Labs  05/29/15 1020  LABPROT 16.8*  INR 1.35    CBG (last 3)   Recent Labs  05/29/15 2341 05/30/15 0348 05/30/15 0856  GLUCAP 116* 99 145*    ABG    Component Value Date/Time   PHART 7.386 05/29/2015 1025   PCO2ART 33.7* 05/29/2015 1025   PO2ART 90.0 05/29/2015 1025   HCO3 20.5 05/29/2015 1025   TCO2 22 05/29/2015 1721   ACIDBASEDEF 4.0* 05/29/2015 1025   O2SAT 97.0 05/29/2015 1025    CXR: n/a  Assessment/Plan: S/P Procedure(s) (LRB): TRANSCATHETER AORTIC VALVE REPLACEMENT, TRANSFEMORAL (N/A) TRANSESOPHAGEAL ECHOCARDIOGRAM (TEE) (N/A)  Doing well  POD2 Maintaining NSR w/ stable BP  Still awaiting routine POD1 ECHO Ready for d/c home   D/C home today once ECHO done  Resume all pre-op meds w/out change  Instructions given   Rexene Alberts, MD 05/31/2015 9:12 AM

## 2015-05-31 NOTE — Discharge Instructions (Signed)
ACTIVITY AND EXERCISE °• Daily activity and exercise are an important part °of your recovery. People recover at different rates °depending on their general health and type of °valve procedure. °• Most people require six to 10 weeks to feel °recovered. °• No lifting, pushing, pulling more than 10 pounds °(examples to avoid: groceries, vacuuming, °gardening, golfing): °- For one week with a procedure through the groin. °- For six weeks for procedures through the chest °wall. °- For three months for procedures through the °breast-bone. °• After the initial healing process of the access site, °we recommend cardiac rehabilitation for all [facility] °patients. Cardiac rehabilitation will help you: °- Rebuild stamina, strength and balance. °- Learn how to participate in activities safely, as well °as help you regain confidence to do so. °- Return to activities of daily living and leisure. °• Discuss attending cardiac rehabilitation at your °follow-up appointment  ° °DRIVING °• Do not drive for four weeks after the date of your °procedure. °• If you have been told by your doctor in the past °that you may not drive, you must talk with him/her °before you begin driving again. °• When you resume driving, you must have someone °with you. ° °HYGIENE °If you had a femoral (leg) procedure, you may take a shower when you return home. After the shower, pat the °site dry. Do NOT use powder, oils or lotions in your groin area until the site has completely healed. °• If you had a chest procedure, you may shower when you return home unless specifically instructed not to by °your discharging practitioner. °- DO NOT scrub incision; pat dry with a towel °- DO NOT apply any lotions, oils, powders to the incision °- No tub baths / swimming for at least six weeks. ° °SITE CARE °• You likely will have small openings in both groins °from catheters used during the procedure. If you °had a transfemoral procedure, one groin will have a °larger opening  and may be bruised or tender. °• If you had a chest procedure, you will have either a °small incision in your upper sternum (breast-bone) °or between your ribs on your left side. °- Chest wall site: The surgical incision should be °kept dry (no lotions / oils / powders) and open °to air. If you experience irritation from clothing °rubbing on the incision, a light gauze dressing °may be applied. °- Inspect your incision daily; notify your °physician if there is increased redness, swelling °or drainage from the incision. °- If the incision is located on your breast-bone °you must avoid lifting objects heavier than a °gallon of milk (eight pounds) and stretching / °twisting / pulling with your arms for at least °three months to ensure strong bone healing. ° ° °CONTACT [phone]- °• Check your sites daily. Contact our office if you have °any of the following problems: °- Redness and warmth that does not go away °- Yellow or green drainage from the wound °- Fever and chills °- Increasing numbness in your legs °- Worsening pain at the site °• If you had a leg/groin procedure, it is normal to °have bruising or a soft lump at the site. It is not °normal if the lump suddenly becomes larger or °more firm. This may mean you are bleeding. If this °happens: °- Lie down °- Have someone press down hard, just above °the hole in your skin where the procedure was °performed for 15 minutes. If after holding on the °site, the lump does not become larger or harder, °they   are performing this correctly. °- If the bleeding has stopped after 15 minutes, rest °and stay laying down for at least two hours. °- If the bleeding continues, call 911 for an °ambulance. Do NOT drive yourself or have °someone else drive you. °

## 2015-05-31 NOTE — Discharge Summary (Signed)
Physician Discharge Summary       [address]       [place] [address]             [phone]    Patient ID: [name] MRN: [id number] DOB/AGE: 19-Mar-1933 82 y.o.  Admit date: 05/29/2015 Discharge date: 05/31/2015  Admission Diagnoses: Severe aortic stenosis  Active Diagnoses:  1.Hypertension 2.Hyperlipidemia 3.Coronary artery diseases/p PCI of left cir and LAD 2005 and PCI of prox PDA 7/09 4.BPH (benign prostatic hypertrophy) 5.GERD (gastroesophageal reflux disease) 6.Anxiety 7.Dementia 8.Left carotid bruit 9.Chronic diastolic congestive heart failure ([place]) 10. Tobacco abuse 11. Incontinence of urine 12. Mild thrombocytopenia  Procedure (s):   Transcatheter Aortic Valve Replacement - Percutaneous Right Transfemoral Approach Edwards Sapien 3 Transcatheter Heart Valve (size 29 mm, model # 9600TFX, serial # [id number]) by Dr. Roxy Manns on 05/29/2015.  History of Presenting Illness:  Patient is an 82 year old male with history of aortic stenosis, coronary artery disease status post PCI and stenting in the distant past, hypertension, GE reflux disease, and mild dementia who has been referred for surgical consultation to discuss treatment options for management of severe symptomatic aortic stenosis. The patient's cardiac history dates back more than 10 years ago when he first presented with symptoms of exertional shortness of breath. He was found to have multivessel coronary artery disease and treated with multivessel PCI and stenting including treatment of the left anterior ascending coronary artery, the left circumflex coronary artery and the posterior descending coronary artery. He initially did well. He was noted to have a murmur on physical exam and echocardiograms revealed the presence of mild to moderate aortic stenosis with mild-to-moderate aortic insufficiency. The patient has been followed regularly by Dr. Radford Pax for the last several  years. He has complained of chronic exertional shortness of breath that has progressed. He underwent a nuclear stress test in September 2015 that was felt to be low risk for myocardial ischemia. Transthoracic echocardiograms have demonstrated gradual progression of the severity of aortic stenosis. Echocardiogram performed 04/21/2014 revealed preserved left ventricular systolic function with ejection fraction estimated 55-65%. At that time the peak velocity across the aortic valve was measured 3.6 m/s corresponding to peak and mean transvalvular gradients estimated 51 and 32 mmHg, respectively. The dimensionless velocity ratio was reported 0.25. The patient's symptoms of exertional shortness of breath continued to progress. He was subsequently referred for pulmonary evaluation and seen in consultation by Dr. Gwenette Greet on 05/22/2014. Pulmonary function tests were performed that did not reveal any significant airflow obstruction although he did have slight improvement with bronchodilator therapy. He was advised to work on weight loss and physical conditioning. The patient's symptoms of exertional shortness of breath have continued to progress. He was seen in follow-up recently by Angelena Form who recommended follow-up diagnostic cardiac catheterization. Catheterization before 04/19/2015 by Dr. Claiborne Billings revealed moderate nonobstructive multivessel coronary artery disease with continued patency in all of his stents. He was found to have severe aortic stenosis with peak to peak and mean transvalvular gradients measured 56 and 49 mmHg by catheterization. The aortic valve area was calculated 0.69 cm. Pulmonary artery pressures were normal. The patient was referred for surgical consultation.  The patient is married and lives locally in [place] with his wife. He has been retired for approximately 13 years, having previously worked for [employer]. He has lived a progressively sedentary lifestyle. He has  been limited primarily by severe exertional shortness of breath over the past 2 years. He states that he gets  short of breath with minimal physical activity, even just getting up to go to the bathroom at night. He denies any history of resting shortness of breath, PND, orthopnea, or chest pain. He has had some mild lower extremity edema in the past which has resolved with diuretic therapy. He has had occasional dizzy spells without syncope. His mobility is also limited because of chronic pain due to arthritis afflicting both knees. He walks using a cane. He has had several mechanical falls but he denies any history of syncope. He also admits to mild short-term memory disturbance. This has not been severe and has not been problematic from a practical standpoint, but it was prominent enough to warrant formal medical evaluation by a neurologist. The patient has been taking Aricept for the past 2 years and according to the patient and his wife he has been quite stable. He still drives an automobile although infrequently. His primary complaint is that of severe exertional shortness of breath and progressive fatigue.  The patient and his wife were counseled at length regarding treatment alternatives for management of severe symptomatic aortic stenosis. Alternative approaches such as conventional aortic valve replacement, transcatheter aortic valve replacement, and palliative medical therapy were compared and contrasted at length. The risks associated with conventional surgical aortic valve replacement were been discussed in detail, as were expectations for post-operative convalescence. Long-term prognosis with medical therapy was discussed. This discussion was placed in the context of the patient's own specific clinical presentation and past medical history. All of their questions been addressed.  Following the decision to proceed with transcatheter aortic valve replacement, a discussion has been held regarding  what types of management strategies would be attempted intraoperatively in the event of life-threatening complications, including whether or not the patient would be considered a candidate for the use of cardiopulmonary bypass and/or conversion to open sternotomy for attempted surgical intervention. The patient has been advised of a variety of complications that might develop and he agreed to accept and proceed with surgery. He was admitted on 05/29/2015 in order to undergo a TAVR via percutaneous right transfemoral approach.   Brief Hospital Course:  He has remained afebrile and hemodynamically stable. He was restarted on his Coreg, Norvasc, and Cozaar as taken pre op. He had a lot of improvement in his shortness of breath after surgery. He was ambulating on room air. He was tolerating a diet and has had a bowel movement. There is no groin hematoma. Follow up echo has been done.He is felt surgically stable for  discharge today.   Latest Vital Signs: Blood pressure 126/84, pulse 62, temperature 97.8 F (36.6 C), temperature source Oral, resp. rate 17, weight 254 lb 3.1 oz (115.3 kg), SpO2 95 %.  Physical Exam: Rhythm: sinus Breath sounds:clear Heart sounds:RRR w/out murmur Incisions:n/a Abdomen:soft Extremities:warm   Discharge Condition:Stable and discharged to home.  Recent laboratory studies:  Lab Results  Component Value Date   WBC 4.4 05/31/2015   HGB 12.0* 05/31/2015   HCT 36.2* 05/31/2015   MCV 91.9 05/31/2015   PLT 109* 05/31/2015   Lab Results  Component Value Date   NA 140 05/31/2015   K 3.7 05/31/2015   CL 109 05/31/2015   CO2 24 05/31/2015   CREATININE 0.81 05/31/2015   GLUCOSE 117* 05/31/2015    Diagnostic Studies:  Ct Coronary Morp W/cta Cor W/score W/ca W/cm &/or Wo/cm  05/09/2015   ADDENDUM REPORT: 05/09/2015 12:55 CLINICAL DATA:  Aortic stenosis EXAM: Cardiac TAVR CT TECHNIQUE: The patient was scanned  on a Philips 256 scanner. A 120 kV retrospective scan was triggered in the descending thoracic aorta at 111 HU's. Gantry rotation speed was 270 msecs and collimation was .9 mm. No beta blockade or nitro were given. The 3D data set was reconstructed in 5% intervals of the R-R cycle. Systolic and diastolic phases were analyzed on a dedicated work station using MPR, MIP and VRT modes. The patient received 80 cc of contrast. FINDINGS: Aortic Valve: Tri-leaflet with heavy calcification of all 3 leaflets. No significant calcium in annulus Aorta: No aneurysmal dilatation minimal calcific plaque in arch normal take off of arch vessels Sinotubular Junction:  30 mm Ascending Thoracic Aorta:  36 mm Aortic Arch:  30 mm Descending Thoracic Aorta:  28 mm Sinus of Valsalva Measurements: Non-coronary:  33.5 mm Right -coronary:  32 mm Left -coronary:  32.6 mm Coronary Artery Height above Annulus: Left Main:  15 mm Right Coronary:  18 mm Virtual Basal Annulus Measurements: Maximum/Minimum Diameter:  30 mm x 23.4 mm Perimeter:  88 mm Area:  605 mm2 Coronary Arteries:  Sufficient height for deployment Optimum Fluoroscopic Angle for Delivery: LAO 11 degrees Cranial 0 degrees ?  Filling defect in LAA IMPRESSION: 1) Calcified tri-leaflet aortic valve Annular area 605 mm2 suitable for 29 mm Sapien 3 valve 2) Coronary arteries suitable height for deployment 3) Normal aortic root and arch 4) Optimum angiographic angle for deployment LAO 11 degrees Cranial 0 degrees 5) Cannot r/o filling defect in LAA Patient has no history of PAF Should interrogate during intraop TEE [place] Electronically Signed   By: Jenkins Rouge M.D.   On: 05/09/2015 12:55  05/09/2015  EXAM: OVER-READ INTERPRETATION  CT CHEST The following report is an over-read performed by radiologist Dr. Rebekah Chesterfield [facility] on  05/09/2015. This over-read does not include interpretation of cardiac or coronary anatomy or pathology. The coronary calcium score/coronary CTA interpretation by the cardiologist is attached. COMPARISON:  No priors. FINDINGS: Extracardiac findings will be described under separate dictation for contemporaneously obtained CTA chest, abdomen and pelvis dated 05/09/2015. IMPRESSION: Please see separate dictation for contemporaneously obtained CTA of the chest, abdomen and pelvis 05/09/2015 for full description of extracardiac findings. Electronically Signed: By: Vinnie Langton M.D. On: 05/09/2015 12:42   Dg Chest Port 1 View  05/30/2015  CLINICAL DATA:  Transcatheter aortic valve replacement EXAM: PORTABLE CHEST 1 VIEW COMPARISON:  05/29/2015 FINDINGS: Swan-Ganz catheter has been removed. Right jugular sheath remains in place. No pneumothorax. Aortic valve replacement unchanged in position Hypoventilation with bibasilar atelectasis. No significant edema or effusion. IMPRESSION: Swan-Ganz catheter removed Progression of bibasilar atelectasis since yesterday. Electronically Signed   By: Lavone Orn Angio Chest Aorta W/cm &/or Wo/cm  05/09/2015  CLINICAL DATA:  82 year old male with history of severe aortic stenosis and shortness of breath worsening for the past 2 years. Preprocedural study prior to potential transcatheter aortic valve replacement (TAVR). EXAM: CT ANGIOGRAPHY CHEST, ABDOMEN AND PELVIS TECHNIQUE: Multidetector CT imaging through the chest, abdomen and pelvis was performed using the standard protocol during bolus administration of intravenous contrast. Multiplanar reconstructed images and MIPs were obtained and reviewed to evaluate the vascular anatomy. CONTRAST:  71mL OMNIPAQUE IOHEXOL 350 MG/ML SOLN COMPARISON:  No prior chest CT. CT the abdomen and pelvis 02/08/2013. FINDINGS: CTA CHEST FINDINGS Mediastinum/Lymph Nodes: Heart size is normal. There is no significant pericardial fluid, thickening or  pericardial calcification. There is atherosclerosis of the thoracic aorta, the great vessels of the mediastinum and the coronary  arteries, including calcified atherosclerotic plaque in the left main, left anterior descending left circumflex and right coronary arteries. Severe calcifications of the aortic valve. No pathologically enlarged mediastinal or hilar lymph nodes. Small hiatal hernia. No axillary lymphadenopathy. Lungs/Pleura: A few scattered tiny 1-3 mm pulmonary nodules are noted throughout the lungs bilaterally, statistically likely benign. No larger more suspicious appearing pulmonary nodules or masses. No acute consolidative airspace disease. No pleural effusions. Small calcified pleural plaque in the anterior aspect of the left hemithorax. No right-sided pleural plaques identified. Musculoskeletal/Soft Tissues: There are no aggressive appearing lytic or blastic lesions noted in the visualized portions of the skeleton. CTA ABDOMEN AND PELVIS FINDINGS Hepatobiliary: Diffuse low attenuation throughout the hepatic parenchyma, compatible with hepatic steatosis. No cystic or solid hepatic lesions are noted. No intra or extrahepatic biliary ductal dilatation. Gallbladder is normal in appearance. Pancreas: No pancreatic mass. No pancreatic ductal dilatation. No pancreatic or peripancreatic fluid or inflammatory changes. Spleen: Unremarkable. Adrenals/Urinary Tract: Small bilateral adrenal gland nodules are again noted, similar to remote prior study from 02/08/2013 at which point these were classified as adenomas. These measure 1.5 cm on the right and 1.4 cm on the left. Multiple parapelvic cysts are noted in the kidneys bilaterally. No suspicious renal lesions. No hydroureteronephrosis. Mild thickening and irregularity of the urinary bladder wall, including multiple small bladder diverticulae. Stomach/Bowel: The appearance of the stomach is normal. There is no pathologic dilatation of small bowel or colon.  Normal appendix. Vascular/Lymphatic: Atherosclerosis throughout the abdominal vasculature with vascular findings and measurements pertinent to potential TAVR procedure, as detailed. No aneurysm or dissection is noted. Single renal arteries bilaterally. Celiac axis, superior mesenteric artery and inferior mesenteric artery and major branches are all widely patent. No lymphadenopathy identified in the abdomen or pelvis. Reproductive: Prostate gland is enlarged and heterogeneous in appearance measuring up to 4.5 x 5.9 x 6.8 cm. Seminal vesicles are unremarkable in appearance. Other: No significant volume of ascites.  No pneumoperitoneum. Musculoskeletal: There are no aggressive appearing lytic or blastic lesions noted in the visualized portions of the skeleton. VASCULAR MEASUREMENTS PERTINENT TO TAVR: AORTA: Minimal Aortic Diameter -  17 x 17 mm Severity of Aortic Calcification -  mild RIGHT PELVIS: Right Common Iliac Artery - Minimal Diameter - 10.6 x 9.9 mm Tortuosity - mild Calcification - mild Right External Iliac Artery - Minimal Diameter - 7.5 x 8.8 mm Tortuosity - mild Calcification - none Right Common Femoral Artery - Minimal Diameter - 8.0 x 8.5 mm Tortuosity - mild Calcification - mild LEFT PELVIS: Left Common Iliac Artery - Minimal Diameter - 11.7 x 10.2 mm Tortuosity - mild Calcification - mild Left External Iliac Artery - Minimal Diameter - 9.2 x 9.1 mm Tortuosity - mild Calcification - minimal Left Common Femoral Artery - Minimal Diameter - 8.6 x 6.8 mm Tortuosity - mild Calcification - mild Review of the MIP images confirms the above findings. IMPRESSION: 1. Vascular findings and measurements pertinent to potential T AVR procedure, as detailed above. This patient does appear to have suitable pelvic arterial access bilaterally. 2. Severely calcified and thickened aortic valve, compatible with the reported clinical history of severe aortic stenosis. 3. Atherosclerosis, including left main and 3 vessel  coronary artery disease. 4. Hepatic steatosis. 5. Bilateral adrenal adenomas redemonstrated. 6. Markedly enlarged heterogeneous appearing prostate gland, presumably from benign prostatic hypertrophy. This is associated with some thickening and irregularity of the urinary bladder wall with multiple small diverticulae, suggesting bladder outlet obstruction. 7. Additional incidental findings, as above.  Electronically Signed   By: Vinnie Langton M.D.   On: 05/09/2015 13:10   Ct Angio Abd/pel W/ And/or W/o  05/09/2015  CLINICAL DATA:  82 year old male with history of severe aortic stenosis and shortness of breath worsening for the past 2 years. Preprocedural study prior to potential transcatheter aortic valve replacement (TAVR). EXAM: CT ANGIOGRAPHY CHEST, ABDOMEN AND PELVIS TECHNIQUE: Multidetector CT imaging through the chest, abdomen and pelvis was performed using the standard protocol during bolus administration of intravenous contrast. Multiplanar reconstructed images and MIPs were obtained and reviewed to evaluate the vascular anatomy. CONTRAST:  7mL OMNIPAQUE IOHEXOL 350 MG/ML SOLN COMPARISON:  No prior chest CT. CT the abdomen and pelvis 02/08/2013. FINDINGS: CTA CHEST FINDINGS Mediastinum/Lymph Nodes: Heart size is normal. There is no significant pericardial fluid, thickening or pericardial calcification. There is atherosclerosis of the thoracic aorta, the great vessels of the mediastinum and the coronary arteries, including calcified atherosclerotic plaque in the left main, left anterior descending left circumflex and right coronary arteries. Severe calcifications of the aortic valve. No pathologically enlarged mediastinal or hilar lymph nodes. Small hiatal hernia. No axillary lymphadenopathy. Lungs/Pleura: A few scattered tiny 1-3 mm pulmonary nodules are noted throughout the lungs bilaterally, statistically likely benign. No larger more suspicious appearing pulmonary nodules or masses. No acute  consolidative airspace disease. No pleural effusions. Small calcified pleural plaque in the anterior aspect of the left hemithorax. No right-sided pleural plaques identified. Musculoskeletal/Soft Tissues: There are no aggressive appearing lytic or blastic lesions noted in the visualized portions of the skeleton. CTA ABDOMEN AND PELVIS FINDINGS Hepatobiliary: Diffuse low attenuation throughout the hepatic parenchyma, compatible with hepatic steatosis. No cystic or solid hepatic lesions are noted. No intra or extrahepatic biliary ductal dilatation. Gallbladder is normal in appearance. Pancreas: No pancreatic mass. No pancreatic ductal dilatation. No pancreatic or peripancreatic fluid or inflammatory changes. Spleen: Unremarkable. Adrenals/Urinary Tract: Small bilateral adrenal gland nodules are again noted, similar to remote prior study from 02/08/2013 at which point these were classified as adenomas. These measure 1.5 cm on the right and 1.4 cm on the left. Multiple parapelvic cysts are noted in the kidneys bilaterally. No suspicious renal lesions. No hydroureteronephrosis. Mild thickening and irregularity of the urinary bladder wall, including multiple small bladder diverticulae. Stomach/Bowel: The appearance of the stomach is normal. There is no pathologic dilatation of small bowel or colon. Normal appendix. Vascular/Lymphatic: Atherosclerosis throughout the abdominal vasculature with vascular findings and measurements pertinent to potential TAVR procedure, as detailed. No aneurysm or dissection is noted. Single renal arteries bilaterally. Celiac axis, superior mesenteric artery and inferior mesenteric artery and major branches are all widely patent. No lymphadenopathy identified in the abdomen or pelvis. Reproductive: Prostate gland is enlarged and heterogeneous in appearance measuring up to 4.5 x 5.9 x 6.8 cm. Seminal vesicles are unremarkable in appearance. Other: No significant volume of ascites.  No  pneumoperitoneum. Musculoskeletal: There are no aggressive appearing lytic or blastic lesions noted in the visualized portions of the skeleton. VASCULAR MEASUREMENTS PERTINENT TO TAVR: AORTA: Minimal Aortic Diameter -  17 x 17 mm Severity of Aortic Calcification -  mild RIGHT PELVIS: Right Common Iliac Artery - Minimal Diameter - 10.6 x 9.9 mm Tortuosity - mild Calcification - mild Right External Iliac Artery - Minimal Diameter - 7.5 x 8.8 mm Tortuosity - mild Calcification - none Right Common Femoral Artery - Minimal Diameter - 8.0 x 8.5 mm Tortuosity - mild Calcification - mild LEFT PELVIS: Left Common Iliac Artery - Minimal Diameter - 11.7  x 10.2 mm Tortuosity - mild Calcification - mild Left External Iliac Artery - Minimal Diameter - 9.2 x 9.1 mm Tortuosity - mild Calcification - minimal Left Common Femoral Artery - Minimal Diameter - 8.6 x 6.8 mm Tortuosity - mild Calcification - mild Review of the MIP images confirms the above findings. IMPRESSION: 1. Vascular findings and measurements pertinent to potential T AVR procedure, as detailed above. This patient does appear to have suitable pelvic arterial access bilaterally. 2. Severely calcified and thickened aortic valve, compatible with the reported clinical history of severe aortic stenosis. 3. Atherosclerosis, including left main and 3 vessel coronary artery disease. 4. Hepatic steatosis. 5. Bilateral adrenal adenomas redemonstrated. 6. Markedly enlarged heterogeneous appearing prostate gland, presumably from benign prostatic hypertrophy. This is associated with some thickening and irregularity of the urinary bladder wall with multiple small diverticulae, suggesting bladder outlet obstruction. 7. Additional incidental findings, as above. Electronically Signed   By: Vinnie Langton M.D.   On: 05/09/2015 13:10    Discharge Medications:   Medication List    STOP taking these medications        nitroGLYCERIN 0.4 MG SL tablet  Commonly known as:   NITROSTAT      TAKE these medications        acetaminophen 325 MG tablet  Commonly known as:  TYLENOL  Take 650 mg by mouth every 6 (six) hours as needed for mild pain.     amLODipine 5 MG tablet  Commonly known as:  NORVASC  TAKE 1 TABLET (5 MG TOTAL) BY MOUTH DAILY.     aspirin 81 MG tablet  Take 81 mg by mouth daily.     carvedilol 12.5 MG tablet  Commonly known as:  COREG  TAKE 1 TABLET TWICE DAILY WITH MEALS     chlorhexidine 0.12 % solution  Commonly known as:  PERIDEX  Rinse with 15 mls twice daily for 30 seconds. Use after breakfast and at bedtime. Spit out excess. Do not swallow.     clopidogrel 75 MG tablet  Commonly known as:  PLAVIX  Take 1 tablet (75 mg total) by mouth daily.     donepezil 10 MG tablet  Commonly known as:  ARICEPT  Take 10 mg by mouth at bedtime.     finasteride 5 MG tablet  Commonly known as:  PROSCAR  Take 5 mg by mouth daily.     furosemide 20 MG tablet  Commonly known as:  LASIX  TAKE 1 TABLET EVERY DAY AFTER BREAKFAST     losartan 50 MG tablet  Commonly known as:  COZAAR  TAKE 1 TABLET EVERY DAY (NEED MD APPOINTMENT)  CALL  [phone]     multivitamin with minerals Tabs tablet  Take 1 tablet by mouth daily.     omeprazole 40 MG capsule  Commonly known as:  PRILOSEC  Take 1 capsule (40 mg total) by mouth daily.     potassium chloride SA 20 MEQ tablet  Commonly known as:  K-DUR,KLOR-CON  Take 1 tablet (20 mEq total) by mouth daily.     rosuvastatin 20 MG tablet  Commonly known as:  CRESTOR  Take 0.5 tablets (10 mg total) by mouth daily.     sertraline 50 MG tablet  Commonly known as:  ZOLOFT  Take 50 mg by mouth every morning.       The patient has been discharged on:   1.Beta Blocker:  Yes [ x  ]  No   [   ]                              If No, reason:  2.Ace Inhibitor/ARB: Yes [ x  ]                                     No  [    ]                                     If No,  reason:  3.Statin:   Yes [ x  ]                  No  [   ]                  If No, reason:  4.Ecasa:  Yes  [ x  ]                  No   [   ]                  If No, reason:  Follow Up Appointments: Follow-up Information    Follow up with Sherren Mocha, MD.   Specialty:  Cardiology   Why:  Needs to have echo and follow up appointment. Call office for appointments   Contact information:   [address] [place] [address] [phone]       Signed: Lars Pinks MPA-C 05/31/2015, 12:34 PM

## 2015-05-31 NOTE — Progress Notes (Signed)
  Echocardiogram 2D Echocardiogram has been performed.  [name] 05/31/2015, 12:01 PM

## 2015-05-31 NOTE — Progress Notes (Signed)
     SUBJECTIVE: He reports dramatic improvement in dyspnea. No chest pain or SOB this am.   Tele: NSR  BP 149/70 mmHg  Pulse 60  Temp(Src) 98.3 F (36.8 C) (Oral)  Resp 21  Wt 254 lb 3.1 oz (115.3 kg)  SpO2 94%  Intake/Output Summary (Last 24 hours) at 05/31/15 [id number] Last data filed at 05/31/15 0600  Gross per 24 hour  Intake    976 ml  Output   3775 ml  Net  -2799 ml    PHYSICAL EXAM General: Well developed, well nourished, in no acute distress. Alert and oriented x 3.  Psych:  Good affect, responds appropriately Neck: No JVD. No masses noted.  Lungs: Clear bilaterally with no wheezes or rhonci noted.  Heart: RRR with no murmurs noted. Abdomen: Bowel sounds are present. Soft, non-tender.  Extremities: No lower extremity edema.   LABS: Basic Metabolic Panel:  Recent Labs  05/30/15 0420 05/30/15 1621 05/31/15 0248  NA 140 141 140  K 4.0 3.9 3.7  CL 110 107 109  CO2 24 26 24   GLUCOSE 118* 121* 117*  BUN 9 9 10   CREATININE 0.77 0.89 0.81  CALCIUM 8.2* 8.7* 8.2*  MG 2.2 2.2  --    CBC:  Recent Labs  05/30/15 1621 05/31/15 0248  WBC 5.1 4.4  HGB 13.1 12.0*  HCT 39.2 36.2*  MCV 92.0 91.9  PLT 120* 109*   Current Meds: . acetaminophen  1,000 mg Oral Q6H  . amLODipine  5 mg Oral Daily  . aspirin EC  81 mg Oral Daily  . carvedilol  12.5 mg Oral BID WC  . chlorhexidine  15 mL Mouth/Throat BID  . clopidogrel  75 mg Oral Daily  . donepezil  10 mg Oral QHS  . finasteride  5 mg Oral Daily  . furosemide  40 mg Oral Daily  . losartan  50 mg Oral Daily  . moving right along book   Does not apply Once  . pantoprazole  40 mg Oral Daily  . potassium chloride SA  20 mEq Oral Daily  . rosuvastatin  10 mg Oral Daily  . sertraline  50 mg Oral q morning - 10a  . sodium chloride flush  3 mL Intravenous Q12H  . sodium chloride flush  3 mL Intravenous Q12H     ASSESSMENT AND PLAN:   1. Severe aortic valve stenosis: POD #2 TAVR. He is doing well this am. BP  stable. Labs reviewed and ok. He is on ASA and Plavix. Echo was not performed yesterday as ordered. Will call echo lab this am to see if we can get his echo done before lunchtime. He is ambulating. Probable d/c home today if echo is stable. He will need 1 weeks f/u with Dr. Roxy Manns and then one month f/u with me in my office with echo the day of f/u with me in one month. I will arrange the f/u in my office and echo.    2. HTN: BP stable on home meds.    Banks,Martin  4/20/20177:07 AM

## 2015-05-31 NOTE — Care Management Note (Addendum)
Case Management Note  Patient Details  Name: TYRESE ZURLO MRN: [id number] Date of Birth: 1933/09/08  Subjective/Objective:      Pt is s/p TAVR              Action/Plan:  PTA - independent from home with wife.  CM will continue to monitor for disposition needs   Expected Discharge Date:  05/31/15               Expected Discharge Plan:  Home/Self Care  In-House Referral:     Discharge planning Services  CM Consult  Post Acute Care Choice:    Choice offered to:     DME Arranged:    DME Agency:     HH Arranged:    HH Agency:     Status of Service:  Completed, signed off  Medicare Important Message Given:    Date Medicare IM Given:    Medicare IM give by:    Date Additional Medicare IM Given:    Additional Medicare Important Message give by:     If discussed at [place] of Stay Meetings, dates discussed:    Additional Comments:  Maryclare Labrador, RN 05/31/2015, 1:39 PM

## 2015-07-06 ENCOUNTER — Ambulatory Visit (INDEPENDENT_AMBULATORY_CARE_PROVIDER_SITE_OTHER): Payer: Commercial Managed Care - HMO | Admitting: Cardiovascular Disease

## 2015-07-06 ENCOUNTER — Ambulatory Visit (HOSPITAL_COMMUNITY): Payer: Commercial Managed Care - HMO | Attending: Cardiology

## 2015-07-06 ENCOUNTER — Encounter: Payer: Self-pay | Admitting: Cardiovascular Disease

## 2015-07-06 ENCOUNTER — Other Ambulatory Visit: Payer: Self-pay

## 2015-07-06 VITALS — BP 122/80 | HR 57 | Ht 74.0 in | Wt 253.0 lb

## 2015-07-06 DIAGNOSIS — Z952 Presence of prosthetic heart valve: Secondary | ICD-10-CM | POA: Diagnosis not present

## 2015-07-06 DIAGNOSIS — I350 Nonrheumatic aortic (valve) stenosis: Secondary | ICD-10-CM

## 2015-07-06 DIAGNOSIS — I509 Heart failure, unspecified: Secondary | ICD-10-CM | POA: Diagnosis not present

## 2015-07-06 DIAGNOSIS — I071 Rheumatic tricuspid insufficiency: Secondary | ICD-10-CM | POA: Diagnosis not present

## 2015-07-06 DIAGNOSIS — I110 Hypertensive heart disease with heart failure: Secondary | ICD-10-CM | POA: Insufficient documentation

## 2015-07-06 DIAGNOSIS — Z954 Presence of other heart-valve replacement: Secondary | ICD-10-CM | POA: Diagnosis not present

## 2015-07-06 DIAGNOSIS — I340 Nonrheumatic mitral (valve) insufficiency: Secondary | ICD-10-CM | POA: Diagnosis not present

## 2015-07-06 DIAGNOSIS — E785 Hyperlipidemia, unspecified: Secondary | ICD-10-CM | POA: Insufficient documentation

## 2015-07-06 DIAGNOSIS — I2510 Atherosclerotic heart disease of native coronary artery without angina pectoris: Secondary | ICD-10-CM | POA: Diagnosis not present

## 2015-07-06 NOTE — Progress Notes (Signed)
TAVR Clinic Follow Up Note  Chief Complaint  Patient presents with  . Follow-up    Post TAVR   History of Present Illness: 82 yo male with history of severe aortic valve stenosis who is s/p TAVR and here today for one month follow up. He is followed by Dr. Radford Pax. Recent progression of his aortic valve stenosis. He underwent TAVR on 05/29/15 with 29 mm Edwards Sapien 3 bioprosthetic valve. He did well following the procedure without complications. Echo post procedure with normal LV function and normally functioning bioprosthetic valve. He was discharged on ASA and Plavix.   He is here today for follow up. He is doing well without chest pain. His dyspnea is improved. He has no issue with his access sites in the right and left groin. No LE edema.   Primary Care Physician: [name], MD Primary Cardiologist: Radford Pax  Past Medical History  Diagnosis Date  . Hypertension   . Heart murmur   . BPH (benign prostatic hypertrophy)   . GERD (gastroesophageal reflux disease)   . Anxiety   . Headache(784.0)     relieved with OTC's  . Dementia     early per pt- recently evaluated at [facility]- eccho7/13  , MRI head EPIC 6/13  . Bruises easily   . Unsteady gait   . Hyperlipidemia   . HBP (high blood pressure)   . Allergic rhinitis   . Chronic fatigue   . Left carotid bruit     w minimal obstruction on doppler  . OAB (overactive bladder)   . Dementia, vascular     Dr [name]  . Coronary artery disease     s/p PCI of left cir and LAD 2005 and PCI of prox PDA 7/09  . Shortness of breath dyspnea     with ADLs  . Arthritis   . Osteoarthritis of left knee   . Coronary atherosclerosis of native coronary artery   . Pure hypercholesterolemia   . Essential hypertension, benign   . Aortic insufficiency   . Aortic stenosis, severe   . Chronic diastolic congestive heart failure ([place])   . Incontinence of urine   . 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 Transurethral resection of prostate    . Turp vaporization    . Cardiac catheterization      3 stents first cath 2 stents placed then additional stent placed  . Hemorroidectomy    . Rotator cuff repair Right 3/13    right  . Tonsillectomy    . Knee arthroscopy  09/19/2011    Procedure: ARTHROSCOPY KNEE;  Surgeon: [name], MD;  Location: [facility];  Service: Orthopedics;  Laterality: Left;  . Colonscopy     . Eye surgery Bilateral     cataract surgery bilat   . Total knee arthroplasty Left 02/21/2014    Procedure: LEFT TOTAL KNEE ARTHROPLASTY;  Surgeon: [name], MD;  Location: [facility];  Service: Orthopedics;  Laterality: Left;  . Stent placed      x3 per pt  . Cardiac catheterization N/A 04/19/2015    Procedure: Right/Left Heart Cath and Coronary Angiography;  Surgeon: [name], MD;  Location: [place] [facility];  Service: Cardiovascular;  Laterality: N/A;  . Colonoscopy w/ polypectomy    . Transcatheter aortic valve replacement, transfemoral N/A 05/29/2015  Procedure: TRANSCATHETER AORTIC VALVE REPLACEMENT, TRANSFEMORAL;  Surgeon: [name], MD;  Location: [place];  Service: Open Heart Surgery;  Laterality: N/A;  . Tee without cardioversion N/A 05/29/2015    Procedure: TRANSESOPHAGEAL ECHOCARDIOGRAM (TEE);  Surgeon: [name], MD;  Location: [place];  Service: Open Heart Surgery;  Laterality: N/A;    Current Outpatient Prescriptions  Medication Sig Dispense Refill  . acetaminophen (TYLENOL) 325 MG tablet Take 650 mg by mouth every 6 (six) hours as needed for mild pain.    [name] Kitchen amLODipine (NORVASC) 5 MG tablet TAKE 1 TABLET (5 MG TOTAL) BY MOUTH DAILY. 30 tablet 11  . aspirin 81 MG tablet Take 81 mg by mouth daily.    . carvedilol (COREG) 12.5 MG tablet TAKE 1 TABLET TWICE DAILY WITH MEALS 180 tablet  0  . chlorhexidine (PERIDEX) 0.12 % solution Rinse with 15 mls twice daily for 30 seconds. Use after breakfast and at bedtime. Spit out excess. Do not swallow. 960 mL prn  . clopidogrel (PLAVIX) 75 MG tablet Take 1 tablet (75 mg total) by mouth daily. 90 tablet 3  . donepezil (ARICEPT) 10 MG tablet Take 10 mg by mouth at bedtime.    . finasteride (PROSCAR) 5 MG tablet Take 5 mg by mouth daily.    . furosemide (LASIX) 20 MG tablet TAKE 1 TABLET EVERY DAY AFTER BREAKFAST 90 tablet 0  . losartan (COZAAR) 50 MG tablet TAKE 1 TABLET EVERY DAY (NEED MD APPOINTMENT)  CALL  [phone] (Patient taking differently: twice daily) 90 tablet 0  . Multiple Vitamin (MULTIVITAMIN WITH MINERALS) TABS Take 1 tablet by mouth daily.    [name] Kitchen omeprazole (PRILOSEC) 40 MG capsule Take 1 capsule (40 mg total) by mouth daily. 90 capsule 2  . potassium chloride SA (K-DUR,KLOR-CON) 20 MEQ tablet Take 1 tablet (20 mEq total) by mouth daily. 90 tablet 2  . rosuvastatin (CRESTOR) 20 MG tablet Take 0.5 tablets (10 mg total) by mouth daily. 45 tablet 2  . sertraline (ZOLOFT) 50 MG tablet Take 50 mg by mouth every morning.      No current facility-administered medications for this visit.    Allergies  Allergen Reactions  . Latex Rash  . Sulfa Antibiotics Hives and Rash    Social History   Social History  . Marital Status: Married    Spouse Name: N/A  . Number of Children: 3  . Years of Education: N/A   Occupational History  . retired    Social History Main Topics  . Smoking status: Former Smoker -- 20 years    Types: Cigars    Quit date: 02/10/1970  . Smokeless tobacco: Former Systems developer    Types: Chew     Comment: smoked cigars off and on x 20 years  . Alcohol Use: 1.8 oz/week    3 Glasses of wine per week     Comment: weekly/    occ beer/wine not recently (05/25/15)  . Drug Use: No  . Sexual Activity: Not Currently   Other Topics Concern  . Not on file   Social History Narrative    Family History    Problem Relation Age of Onset  . Anesthesia problems Neg Hx   . Lung cancer Father   . Heart attack Neg Hx     Review of Systems:  As stated in the HPI and otherwise negative.   BP 122/80 mmHg  Pulse 57  Ht 6\' 2"  (1.88 m)  Wt 253 lb (114.76 kg)  BMI  32.47 kg/m2  SpO2 95%  Physical Examination: General: Well developed, well nourished, NAD HEENT: OP clear, mucus membranes moist SKIN: warm, dry. No rashes. Neuro: No focal deficits Musculoskeletal: Muscle strength 5/5 all ext Psychiatric: Mood and affect normal Neck: No JVD, no carotid bruits, no thyromegaly, no lymphadenopathy. Lungs:Clear bilaterally, no wheezes, rhonci, crackles Cardiovascular: Regular rate and rhythm. No murmurs, gallops or rubs. Abdomen:Soft. Bowel sounds present. Non-tender.  Extremities: No lower extremity edema. Pulses are 2 + in the bilateral DP/PT.  EKG:  EKG is not ordered today. The ekg ordered today demonstrates   Recent Labs: 05/25/2015: ALT 26 05/30/2015: Magnesium 2.2 05/31/2015: BUN 10; Creatinine, Ser 0.81; Hemoglobin 12.0*; Platelets 109*; Potassium 3.7; Sodium 140   Lipid Panel    Component Value Date/Time   CHOL 141 10/20/2014 1111   TRIG 126.0 10/20/2014 1111   HDL 47.10 10/20/2014 1111   CHOLHDL 3 10/20/2014 1111   VLDL 25.2 10/20/2014 1111   LDLCALC 69 10/20/2014 1111     Wt Readings from Last 3 Encounters:  07/06/15 253 lb (114.76 kg)  05/31/15 254 lb 3.1 oz (115.3 kg)  05/25/15 254 lb 6.6 oz (115.4 kg)     Other studies Reviewed: Additional studies/ records that were reviewed today include: . Review of the above records demonstrates:    Assessment and Plan:   1. Severe aortic valve stenosis: He is now s/p TAVR one month ago. Echo today with normal LV function and normally functioning bioprosthetic aortic valve. No evidence of AI. He is NYHA class 2. He is on ASA and Plavix. I will continue his ASA and Plavix for now. Repeat echo in one year and he will see me then. He  will need to Dr. Radford Pax in 6 months.   Current medicines are reviewed at length with the patient today.  The patient does not have concerns regarding medicines.  The following changes have been made:  no change  Labs/ tests ordered today include:  No orders of the defined types were placed in this encounter.    Signed, Lauree Chandler, MD 07/06/2015 5:12 PM    [facility] [address], [place], [place]  [address] Phone: [phone]; Fax: [phone]

## 2015-07-06 NOTE — Patient Instructions (Signed)
Medication Instructions:  Your physician recommends that you continue on your current medications as directed. Please refer to the Current Medication list given to you today.   Labwork: none  Testing/Procedures: Your physician has requested that you have an echocardiogram. Echocardiography is a painless test that uses sound waves to create images of your heart. It provides your doctor with information about the size and shape of your heart and how well your heart's chambers and valves are working. This procedure takes approximately one hour. There are no restrictions for this procedure. To be done in about 11 months on day of appointment with Dr. Angelena Form    Follow-Up: Your physician wants you to follow-up in: 11 months.  You will receive a reminder letter in the mail two months in advance. If you don't receive a letter, please call our office to schedule the follow-up appointment.   Any Other Special Instructions Will Be Listed Below (If Applicable).     If you need a refill on your cardiac medications before your next appointment, please call your pharmacy.

## 2015-07-27 ENCOUNTER — Other Ambulatory Visit: Payer: Self-pay | Admitting: Cardiology

## 2015-08-07 ENCOUNTER — Telehealth: Payer: Self-pay | Admitting: Cardiology

## 2015-08-07 DIAGNOSIS — M5442 Lumbago with sciatica, left side: Secondary | ICD-10-CM | POA: Diagnosis not present

## 2015-08-07 NOTE — Telephone Encounter (Signed)
Mrs. [name] is calling because [name] is to have a CT of the Lumbar and is needing to come off of his plavix 3 days before the test and wants to know will that be ok .[name] Kitchen Please call

## 2015-08-07 NOTE — Telephone Encounter (Signed)
Please forward to cardiologist for anti-platelet clearance recommendations, thanks.

## 2015-08-08 NOTE — Telephone Encounter (Signed)
Informed patient that he is fine to be off Plavix and ASA for 3 days for procedure.  Message sent to Dr. Charlestine Night office as well.   Patient was grateful for call.

## 2015-08-08 NOTE — Telephone Encounter (Signed)
He is fine to be off Plavix and ASA for 3 days for procedure

## 2015-08-20 ENCOUNTER — Other Ambulatory Visit: Payer: Self-pay | Admitting: Orthopedic Surgery

## 2015-08-20 DIAGNOSIS — M5442 Lumbago with sciatica, left side: Principal | ICD-10-CM

## 2015-08-20 DIAGNOSIS — M5441 Lumbago with sciatica, right side: Secondary | ICD-10-CM

## 2015-08-29 ENCOUNTER — Other Ambulatory Visit: Payer: Self-pay | Admitting: Cardiology

## 2015-08-29 NOTE — Telephone Encounter (Signed)
carvedilol (COREG) 12.5 MG tablet  Medication   Date: 07/27/2015  Department: [address] Office  Ordering/Authorizing: Sueanne Margarita, MD      Order Providers    Prescribing Provider Encounter Provider   Sueanne Margarita, MD Sueanne Margarita, MD    Medication Detail      Disp Refills Start End     carvedilol (COREG) 12.5 MG tablet 180 tablet 3 07/27/2015     Sig: TAKE 1 TABLET TWICE DAILY WITH MEALS    E-Prescribing Status: Receipt confirmed by pharmacy (07/27/2015 5:06 PM EDT)     Pharmacy    [place], [address] [address]     rosuvastatin (CRESTOR) 20 MG tablet  Medication   Date: 01/15/2015  Department: [address] Office  Ordering/Authorizing: Sueanne Margarita, MD      Order Providers    Prescribing Provider Encounter Provider   Sueanne Margarita, MD Jettie Booze, MD    Medication Detail      Disp Refills Start End     rosuvastatin (CRESTOR) 20 MG tablet 45 tablet 2 01/15/2015     Sig - Route: Take 0.5 tablets (10 mg total) by mouth daily. - Oral    E-Prescribing Status: Receipt confirmed by pharmacy (01/15/2015 12:18 PM EST)     Pharmacy    [facility] [place], [address]

## 2015-09-05 ENCOUNTER — Ambulatory Visit
Admission: RE | Admit: 2015-09-05 | Discharge: 2015-09-05 | Disposition: A | Discharge status: Home / Self Care | Payer: Commercial Managed Care - HMO | Source: Ambulatory Visit | Attending: Orthopedic Surgery | Admitting: Orthopedic Surgery

## 2015-09-05 DIAGNOSIS — M5442 Lumbago with sciatica, left side: Principal | ICD-10-CM

## 2015-09-05 DIAGNOSIS — M4806 Spinal stenosis, lumbar region: Secondary | ICD-10-CM | POA: Diagnosis not present

## 2015-09-05 DIAGNOSIS — M5441 Lumbago with sciatica, right side: Secondary | ICD-10-CM

## 2015-09-05 MED ORDER — DIAZEPAM 5 MG PO TABS: 5.0000 mg | ORAL_TABLET | Freq: Once | ORAL | Status: DC

## 2015-09-05 MED ORDER — IOPAMIDOL (ISOVUE-M 200) INJECTION 41%
15.0000 mL | Freq: Once | INTRAMUSCULAR | Status: AC
  Administered 2015-09-05: 15 mL via INTRATHECAL

## 2015-09-05 NOTE — Discharge Instructions (Signed)

## 2015-09-24 DIAGNOSIS — M5442 Lumbago with sciatica, left side: Secondary | ICD-10-CM | POA: Diagnosis not present

## 2015-10-10 ENCOUNTER — Telehealth: Payer: Self-pay | Admitting: *Deleted

## 2015-10-10 MED ORDER — AMOXICILLIN 500 MG PO TABS: ORAL_TABLET | ORAL | 4 refills | Status: DC

## 2015-10-10 NOTE — Telephone Encounter (Signed)
error 

## 2015-10-10 NOTE — Telephone Encounter (Signed)
Spoke with pt's wife. Pt is going in for dental cleaning tomorrow. I told wife he would need to take an antibiotic prior to any dental visit. He is able to take amoxicillin. Will send prescription to [facility] in [place]. Wife aware pt is to take 2000 mg 30-60 minutes prior to dental cleaning.

## 2015-10-10 NOTE — Telephone Encounter (Signed)
wants to know if he needs anything before dental procedure liek an antibiotic etc... procedure tomorrow, please call

## 2015-10-16 DIAGNOSIS — J301 Allergic rhinitis due to pollen: Secondary | ICD-10-CM | POA: Diagnosis not present

## 2015-10-16 DIAGNOSIS — Z0000 Encounter for general adult medical examination without abnormal findings: Secondary | ICD-10-CM | POA: Diagnosis not present

## 2015-10-16 DIAGNOSIS — E7800 Pure hypercholesterolemia, unspecified: Secondary | ICD-10-CM | POA: Diagnosis not present

## 2015-10-16 DIAGNOSIS — K219 Gastro-esophageal reflux disease without esophagitis: Secondary | ICD-10-CM | POA: Diagnosis not present

## 2015-10-16 DIAGNOSIS — Z23 Encounter for immunization: Secondary | ICD-10-CM | POA: Diagnosis not present

## 2015-10-16 DIAGNOSIS — I110 Hypertensive heart disease with heart failure: Secondary | ICD-10-CM | POA: Diagnosis not present

## 2015-10-16 DIAGNOSIS — I700 Atherosclerosis of aorta: Secondary | ICD-10-CM | POA: Diagnosis not present

## 2015-10-16 DIAGNOSIS — F0151 Vascular dementia with behavioral disturbance: Secondary | ICD-10-CM | POA: Diagnosis not present

## 2015-10-16 DIAGNOSIS — I5030 Unspecified diastolic (congestive) heart failure: Secondary | ICD-10-CM | POA: Diagnosis not present

## 2015-10-16 DIAGNOSIS — M150 Primary generalized (osteo)arthritis: Secondary | ICD-10-CM | POA: Diagnosis not present

## 2015-10-16 DIAGNOSIS — I25119 Atherosclerotic heart disease of native coronary artery with unspecified angina pectoris: Secondary | ICD-10-CM | POA: Diagnosis not present

## 2015-10-31 ENCOUNTER — Telehealth: Payer: Self-pay | Admitting: Cardiology

## 2015-10-31 NOTE — Telephone Encounter (Signed)
Obtained signed clearance from Dr. Theodosia Blender folder.  Faxed over clearance.  Called and let pt know fax had been sent.  Pt appreciative for all help.  Called Dr. Caesar Bookman office and advised them that fax was sent and to let us know if they don't receive it and we can fax again.

## 2015-10-31 NOTE — Telephone Encounter (Signed)
Follow Up:    Pt says his dentist said they have faxed the clearance over twice. They need it asap,pt is in so much pain.Please try to send this today if possible,

## 2015-12-27 ENCOUNTER — Encounter: Payer: Self-pay | Admitting: *Deleted

## 2016-01-07 ENCOUNTER — Ambulatory Visit (INDEPENDENT_AMBULATORY_CARE_PROVIDER_SITE_OTHER): Payer: Commercial Managed Care - HMO | Admitting: Physician Assistant

## 2016-01-07 ENCOUNTER — Encounter: Payer: Self-pay | Admitting: Physician Assistant

## 2016-01-07 VITALS — BP 132/60 | HR 60 | Ht 74.0 in | Wt 251.8 lb

## 2016-01-07 DIAGNOSIS — Z952 Presence of prosthetic heart valve: Secondary | ICD-10-CM

## 2016-01-07 DIAGNOSIS — I2510 Atherosclerotic heart disease of native coronary artery without angina pectoris: Secondary | ICD-10-CM

## 2016-01-07 DIAGNOSIS — I5032 Chronic diastolic (congestive) heart failure: Secondary | ICD-10-CM | POA: Diagnosis not present

## 2016-01-07 DIAGNOSIS — I10 Essential (primary) hypertension: Secondary | ICD-10-CM

## 2016-01-07 NOTE — Progress Notes (Signed)
Cardiology Office Note    Date:  01/07/2016   ID:  [name], DOB 1933/10/12, MRN [id number]  PCP:  [name], MD  Cardiologist: Dr. Radford Pax TAVR:Dr. Angelena Form  Chief Complaint  Patient presents with  . Follow-up    History of Present Illness:  [name] is a 82 y.o. male  with history of severe aortic valve stenosis who is s/p TAVR 05/29/15. He did well following the procedure without complications and post procedure echo with normal LV function and normally functioning bioprosthetic valve. Patient also has history of CAD status post PCI of the left circumflex and LAD in 2005, PCI of the proximal PDA in 2009. Cardiac catheterization 04/19/15 patent stents with 25% mid RCA, normal LV EF 60-65% .Also has hypertension, HLD, chronic diastolic CHF.  Patient comes in today feeling well. His main complaint is arthritis and trouble getting around because of joint pain. He takes prednisone as needed. He also has a lot of nasal congestion and hoarseness. He's been on Mucinex. He has chronic dyspnea on exertion but it is much better since TAVR. They have noticed marked improvement since that procedure.    Past Medical History:  Diagnosis Date  . Allergic rhinitis   . Anxiety   . Aortic insufficiency   . Aortic stenosis, severe   . Arthritis   . BPH (benign prostatic hypertrophy)   . Bruises easily   . Chronic diastolic congestive heart failure ([place])   . Chronic fatigue   . Coronary artery disease    s/p PCI of left cir and LAD 2005 and PCI of prox PDA 7/09  . Coronary atherosclerosis of native coronary artery   . Dementia    early per pt- recently evaluated at [facility]- eccho7/13  , MRI head EPIC 6/13  . Dementia, vascular    Dr [name]  . Essential hypertension, benign   . GERD (gastroesophageal reflux disease)   . HBP (high blood pressure)   . Headache(784.0)    relieved with OTC's  . Heart murmur   . Hyperlipidemia   . Hypertension   . Incontinence of urine     . Left carotid bruit    w minimal obstruction on doppler  . OAB (overactive bladder)   . Osteoarthritis of left knee   . Pure hypercholesterolemia   . 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 Shortness of breath dyspnea    with ADLs  . Unsteady gait     Past Surgical History:  Procedure Laterality Date  . CARDIAC CATHETERIZATION     3 stents first cath 2 stents placed then additional stent placed  . CARDIAC CATHETERIZATION N/A 04/19/2015   Procedure: Right/Left Heart Cath and Coronary Angiography;  Surgeon: [name], MD;  Location: [place] [facility];  Service: Cardiovascular;  Laterality: N/A;  . COLONOSCOPY W/ POLYPECTOMY    . colonscopy     . EYE SURGERY Bilateral    cataract surgery bilat   . HEMORROIDECTOMY    . KNEE ARTHROSCOPY  09/19/2011   Procedure: ARTHROSCOPY KNEE;  Surgeon: [name], MD;  Location: [facility];  Service: Orthopedics;  Laterality: Left;  . ROTATOR CUFF REPAIR Right 3/13   right  . stent placed     x3 per pt  . TEE WITHOUT CARDIOVERSION N/A 05/29/2015   Procedure: TRANSESOPHAGEAL ECHOCARDIOGRAM (TEE);  Surgeon: [name], MD;  Location: [facility]  OR;  Service: Open Heart Surgery;  Laterality: N/A;  . TONSILLECTOMY    . TOTAL KNEE ARTHROPLASTY Left 02/21/2014   Procedure: LEFT TOTAL KNEE ARTHROPLASTY;  Surgeon: [name], MD;  Location: [facility];  Service: Orthopedics;  Laterality: Left;  . TRANSCATHETER AORTIC VALVE REPLACEMENT, TRANSFEMORAL N/A 05/29/2015   Procedure: TRANSCATHETER AORTIC VALVE REPLACEMENT, TRANSFEMORAL;  Surgeon: [name], MD;  Location: [place];  Service: Open Heart Surgery;  Laterality: N/A;  . TRANSURETHRAL RESECTION OF PROSTATE    . TURP VAPORIZATION      Current Medications: Outpatient Medications Prior to Visit  Medication Sig Dispense Refill  . acetaminophen (TYLENOL) 325  MG tablet Take 650 mg by mouth every 6 (six) hours as needed for mild pain.    [name] Kitchen amLODipine (NORVASC) 5 MG tablet TAKE 1 TABLET (5 MG TOTAL) BY MOUTH DAILY. 30 tablet 11  . amoxicillin (AMOXIL) 500 MG tablet Take 4 tablets by mouth 30-60 minutes prior to dental visit. 4 tablet 4  . aspirin 81 MG tablet Take 81 mg by mouth daily.    . chlorhexidine (PERIDEX) 0.12 % solution Rinse with 15 mls twice daily for 30 seconds. Use after breakfast and at bedtime. Spit out excess. Do not swallow. 960 mL prn  . clopidogrel (PLAVIX) 75 MG tablet Take 1 tablet (75 mg total) by mouth daily. 90 tablet 3  . finasteride (PROSCAR) 5 MG tablet Take 5 mg by mouth daily.    [name] Kitchen losartan (COZAAR) 50 MG tablet Take 1 tablet (50 mg total) by mouth daily. 90 tablet 3  . Multiple Vitamin (MULTIVITAMIN WITH MINERALS) TABS Take 1 tablet by mouth daily.    [name] Kitchen omeprazole (PRILOSEC) 40 MG capsule Take 1 capsule (40 mg total) by mouth daily. 90 capsule 3  . potassium chloride SA (K-DUR,KLOR-CON) 20 MEQ tablet Take 1 tablet (20 mEq total) by mouth daily. 90 tablet 3  . sertraline (ZOLOFT) 50 MG tablet Take 50 mg by mouth every morning.     . carvedilol (COREG) 12.5 MG tablet TAKE 1 TABLET TWICE DAILY WITH MEALS (Patient not taking: Reported on 01/07/2016) 180 tablet 3  . donepezil (ARICEPT) 10 MG tablet Take 10 mg by mouth at bedtime.    . furosemide (LASIX) 20 MG tablet TAKE 1 TABLET ONE TIME DAILY (Patient not taking: Reported on 01/07/2016) 90 tablet 3  . rosuvastatin (CRESTOR) 20 MG tablet TAKE 1/2 TABLET EVERY DAY (Patient not taking: Reported on 01/07/2016) 45 tablet 2   No facility-administered medications prior to visit.      Allergies:   Latex and Sulfa antibiotics   Social History   Social History  . Marital status: Married    Spouse name: N/A  . Number of children: 3  . Years of education: N/A   Occupational History  . retired    Social History Main Topics  . Smoking status: Former Smoker    Years: 20.00     Types: Cigars    Quit date: 02/10/1970  . Smokeless tobacco: Former Systems developer    Types: Chew     Comment: smoked cigars off and on x 20 years  . Alcohol use 1.8 oz/week    3 Glasses of wine per week     Comment: weekly/    occ beer/wine not recently (05/25/15)  . Drug use: No  . Sexual activity: Not Currently   Other Topics Concern  . None   Social History Narrative  . None     Family History:  The patient's family history includes Lung cancer in his father.   ROS:   Please see the history of present illness.    Review of Systems  Constitution: Positive for malaise/fatigue and weight gain.  HENT: Negative.   Cardiovascular: Positive for dyspnea on exertion.  Respiratory: Positive for cough.   Endocrine: Negative.   Hematologic/Lymphatic: Bruises/bleeds easily.  Musculoskeletal: Positive for arthritis and joint pain.  Gastrointestinal: Negative.   Genitourinary: Negative.   Neurological: Negative.    All other systems reviewed and are negative.   PHYSICAL EXAM:   VS:  BP 132/60   Pulse 60   Ht 6\' 2"  (1.88 m)   Wt 251 lb 12.8 oz (114.2 kg)   BMI 32.33 kg/m   Physical Exam  GEN: Well nourished, well developed, in no acute distress  Neck: no JVD, carotid bruits, or masses Cardiac:RRR; Soft 1/6 systolic murmur at the left sternal border, no rubs, or gallops  Respiratory:  clear to auscultation bilaterally, normal work of breathing GI: soft, nontender, nondistended, + BS Ext: without cyanosis, clubbing, or edema, Good distal pulses bilaterally MS: no deformity or atrophy  Skin: warm and dry, no rash Psych: euthymic mood, full affect  Wt Readings from Last 3 Encounters:  01/07/16 251 lb 12.8 oz (114.2 kg)  07/06/15 253 lb (114.8 kg)  05/31/15 254 lb 3.1 oz (115.3 kg)      Studies/Labs Reviewed:   EKG:  EKG is not ordered today.   Recent Labs: 05/25/2015: ALT 26 05/30/2015: Magnesium 2.2 05/31/2015: BUN 10; Creatinine, Ser 0.81; Hemoglobin 12.0; Platelets 109;  Potassium 3.7; Sodium 140   Lipid Panel    Component Value Date/Time   CHOL 141 10/20/2014 1111   TRIG 126.0 10/20/2014 1111   HDL 47.10 10/20/2014 1111   CHOLHDL 3 10/20/2014 1111   VLDL 25.2 10/20/2014 1111   LDLCALC 69 10/20/2014 1111    Additional studies/ records that were reviewed today include:  2-D echo 07/06/15 Study Conclusions   - Left ventricle: The cavity size was normal. There was mild   concentric hypertrophy. Systolic function was vigorous. The   estimated ejection fraction was in the range of 65% to 70%. Wall   motion was normal; there were no regional wall motion   abnormalities. Doppler parameters are consistent with abnormal   left ventricular relaxation (grade 1 diastolic dysfunction). - Aortic valve: S/P TAVR. Normal transaortic gradients. No central   AI, trivial paravalvular leak. Mean gradient (S): 12 mm Hg. Peak   gradient (S): 24 mm Hg. Valve area (VTI): 2.78 cm^2. Valve area   (Vmax): 2.87 cm^2. Valve area (Vmean): 2.92 cm^2. - Aortic root: The aortic root was normal in size. - Ascending aorta: The ascending aorta was normal in size. - Mitral valve: Mildly thickened leaflets . There was trivial   regurgitation. - Left atrium: The atrium was moderately dilated. - Right ventricle: Systolic function was normal. - Right atrium: The atrium was normal in size. - Tricuspid valve: There was mild regurgitation. - Pulmonary arteries: Systolic pressure was within the normal   range. PA peak pressure: 31 mm Hg (S). - Inferior vena cava: The vessel was normal in size. - Pericardium, extracardiac: There was no pericardial effusion.   Impressions:   - S/P TAVR. The leaflets are opening well. Normal transaortic   gradients. No central AI, trivial paravalvular leak. AVA 2.92   cm2.   Cardiac catheterization 04/19/15  Conclusion   Mid RCA lesion, 25% stenosed.  The left ventricular systolic function  is normal.   Normal LV function with an ejection fraction  of 60-65%.   Severely calcified aortic valve with reduced excursion, and evidence for severe aortic valve stenosis with a peak to peak gradient of 56, a mean gradient of 49.3, and a calculated valve area of 0.69 cm.   Dilated aortic root.   Mild nonobstructive CAD with widely patent stents in the proximal and mid LAD; patent stent in the ramus intermediate vessel; normal left circumflex coronary artery; and dominant RCA with 20-30% mid nonobstructive stenosis.   RECOMMENDATION: The patient will require aortic valve replacement surgery.  Dr. Radford Pax was notified.  Surgical consultation will be obtained as an outpatient for consideration of TAVR versus open procedure.         ASSESSMENT:    1. S/P TAVR (transcatheter aortic valve replacement)   2. Atherosclerosis of native coronary artery of native heart without angina pectoris   3. Essential hypertension, benign   4. Chronic diastolic congestive heart failure (HCC)      PLAN:  In order of problems listed above:  Status post T aVR doing well. Follow-up echo 06/2015 look good LVEF 65-70% with grade 1 diastolic dysfunction. Continues to have chronic dyspnea on exertion but it is much improved. Follow-up with Dr. Radford Pax in 6 months.  CAD stable without angina cath 04/2015 nonobstructive CAD  Essential hypertension controlled on current medications no changes  Chronic diastolic CHF compensated    Medication Adjustments/Labs and Tests Ordered: Current medicines are reviewed at length with the patient today.  Concerns regarding medicines are outlined above.  Medication changes, Labs and Tests ordered today are listed in the Patient Instructions below. Patient Instructions  Medication Instructions:  Your physician recommends that you continue on your current medications as directed. Please refer to the Current Medication list given to you today.   Labwork: None  Testing/Procedures: None  Follow-Up: Your physician wants you to  follow-up in: 6 months with Dr. Radford Pax. You will receive a reminder letter in the mail two months in advance. If you don't receive a letter, please call our office to schedule the follow-up appointment.   Any Other Special Instructions Will Be Listed Below (If Applicable).     If you need a refill on your cardiac medications before your next appointment, please call your pharmacy.      Sumner Boast, PA-C  01/07/2016 9:23 AM    [facility] [address], [place], [place]  [address] Phone: [phone]; Fax: [phone]

## 2016-01-07 NOTE — Patient Instructions (Signed)

## 2016-01-14 ENCOUNTER — Other Ambulatory Visit: Payer: Self-pay | Admitting: Physician Assistant

## 2016-01-14 MED ORDER — AMLODIPINE BESYLATE 5 MG PO TABS: ORAL_TABLET | ORAL | 7 refills | Status: DC

## 2016-01-19 IMAGING — CR DG CHEST 2V
2 series · 2 of 2 positions shown · non-contrast
Comparison: PA and lateral chest x-ray March 19, 2013 and PA
and lateral chest of May 06, 2011.

CLINICAL DATA: Several week history of cough and shortness of
breath ; history of coronary artery disease and aortic stenosis

EXAM:
CHEST  2 VIEW

[w chest pa]
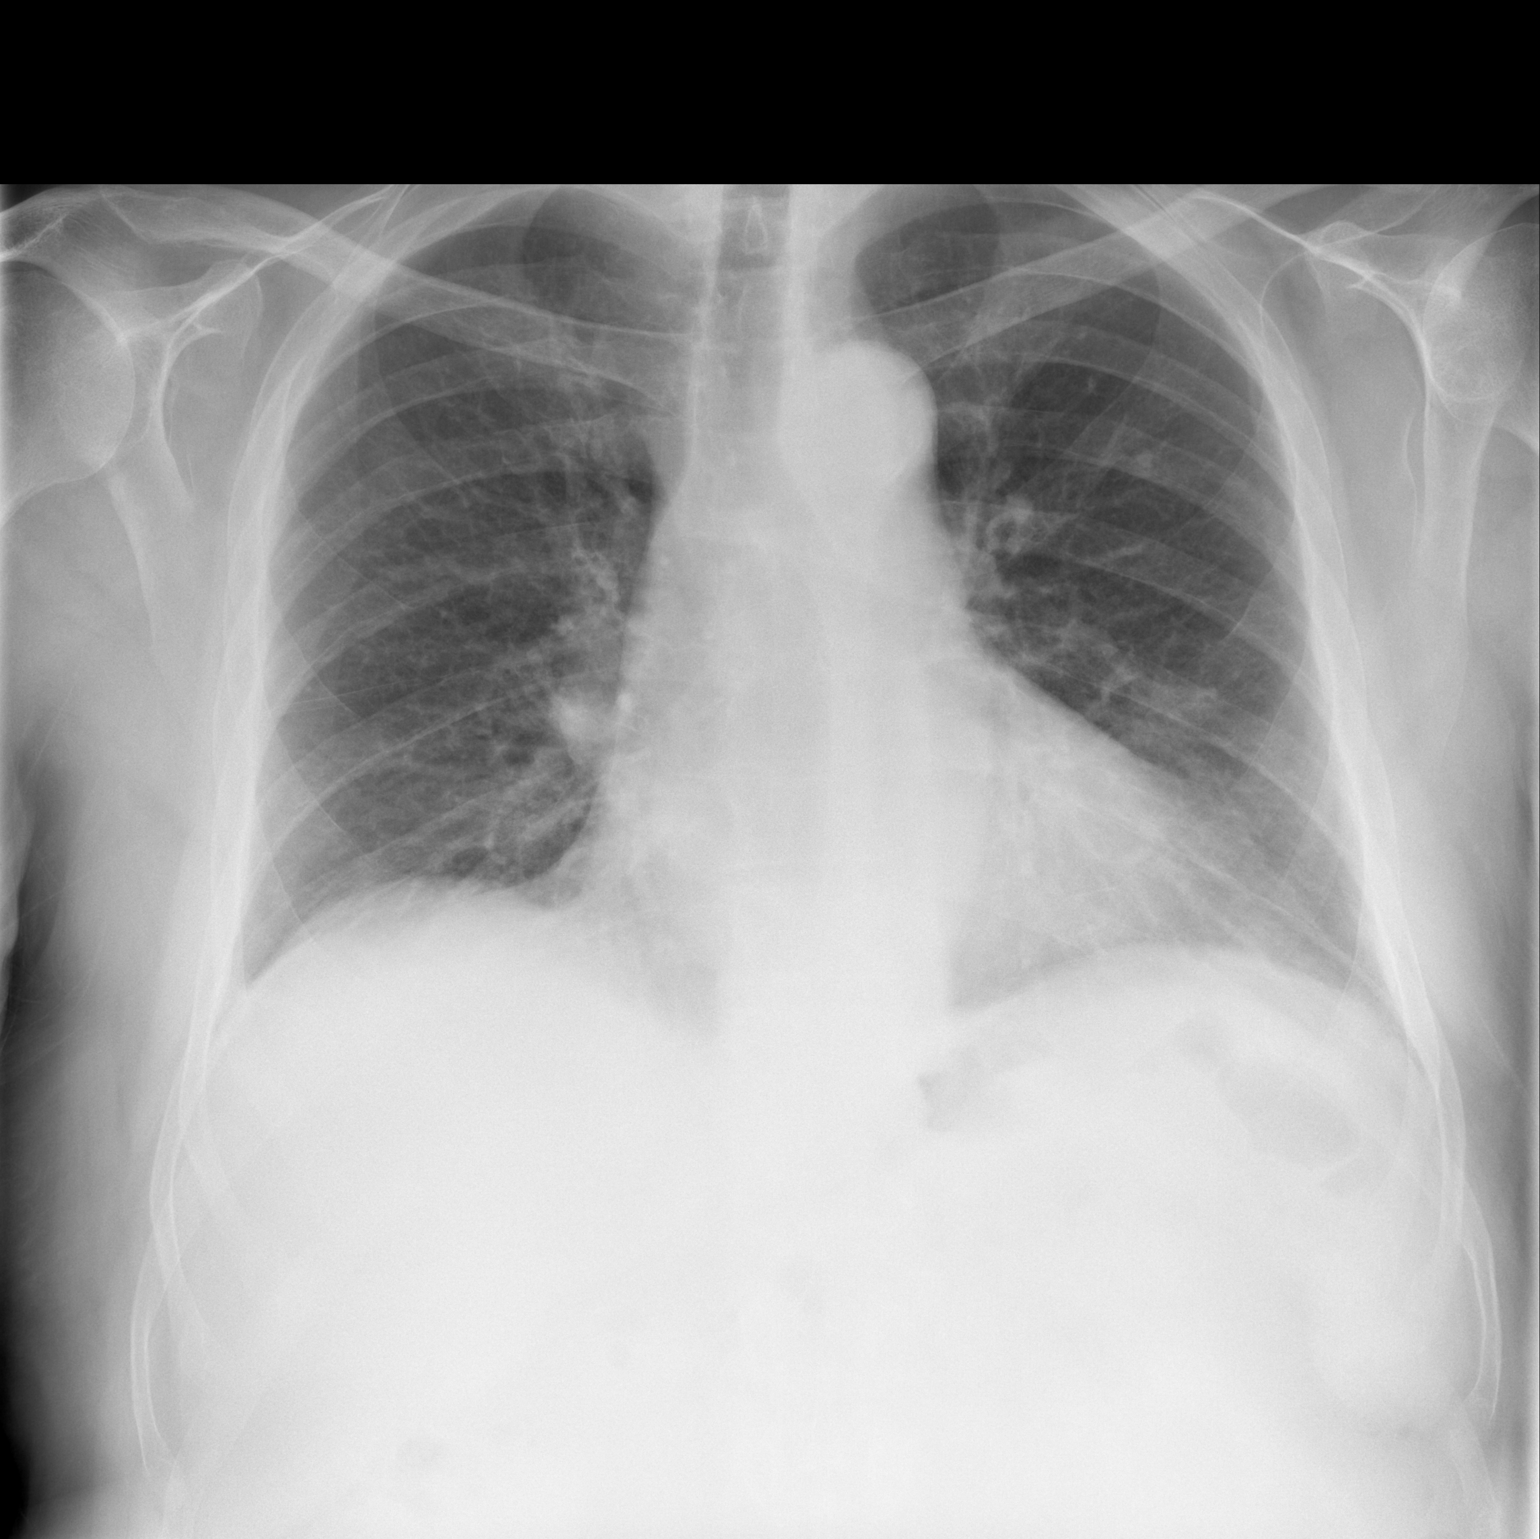

[w chest lat]
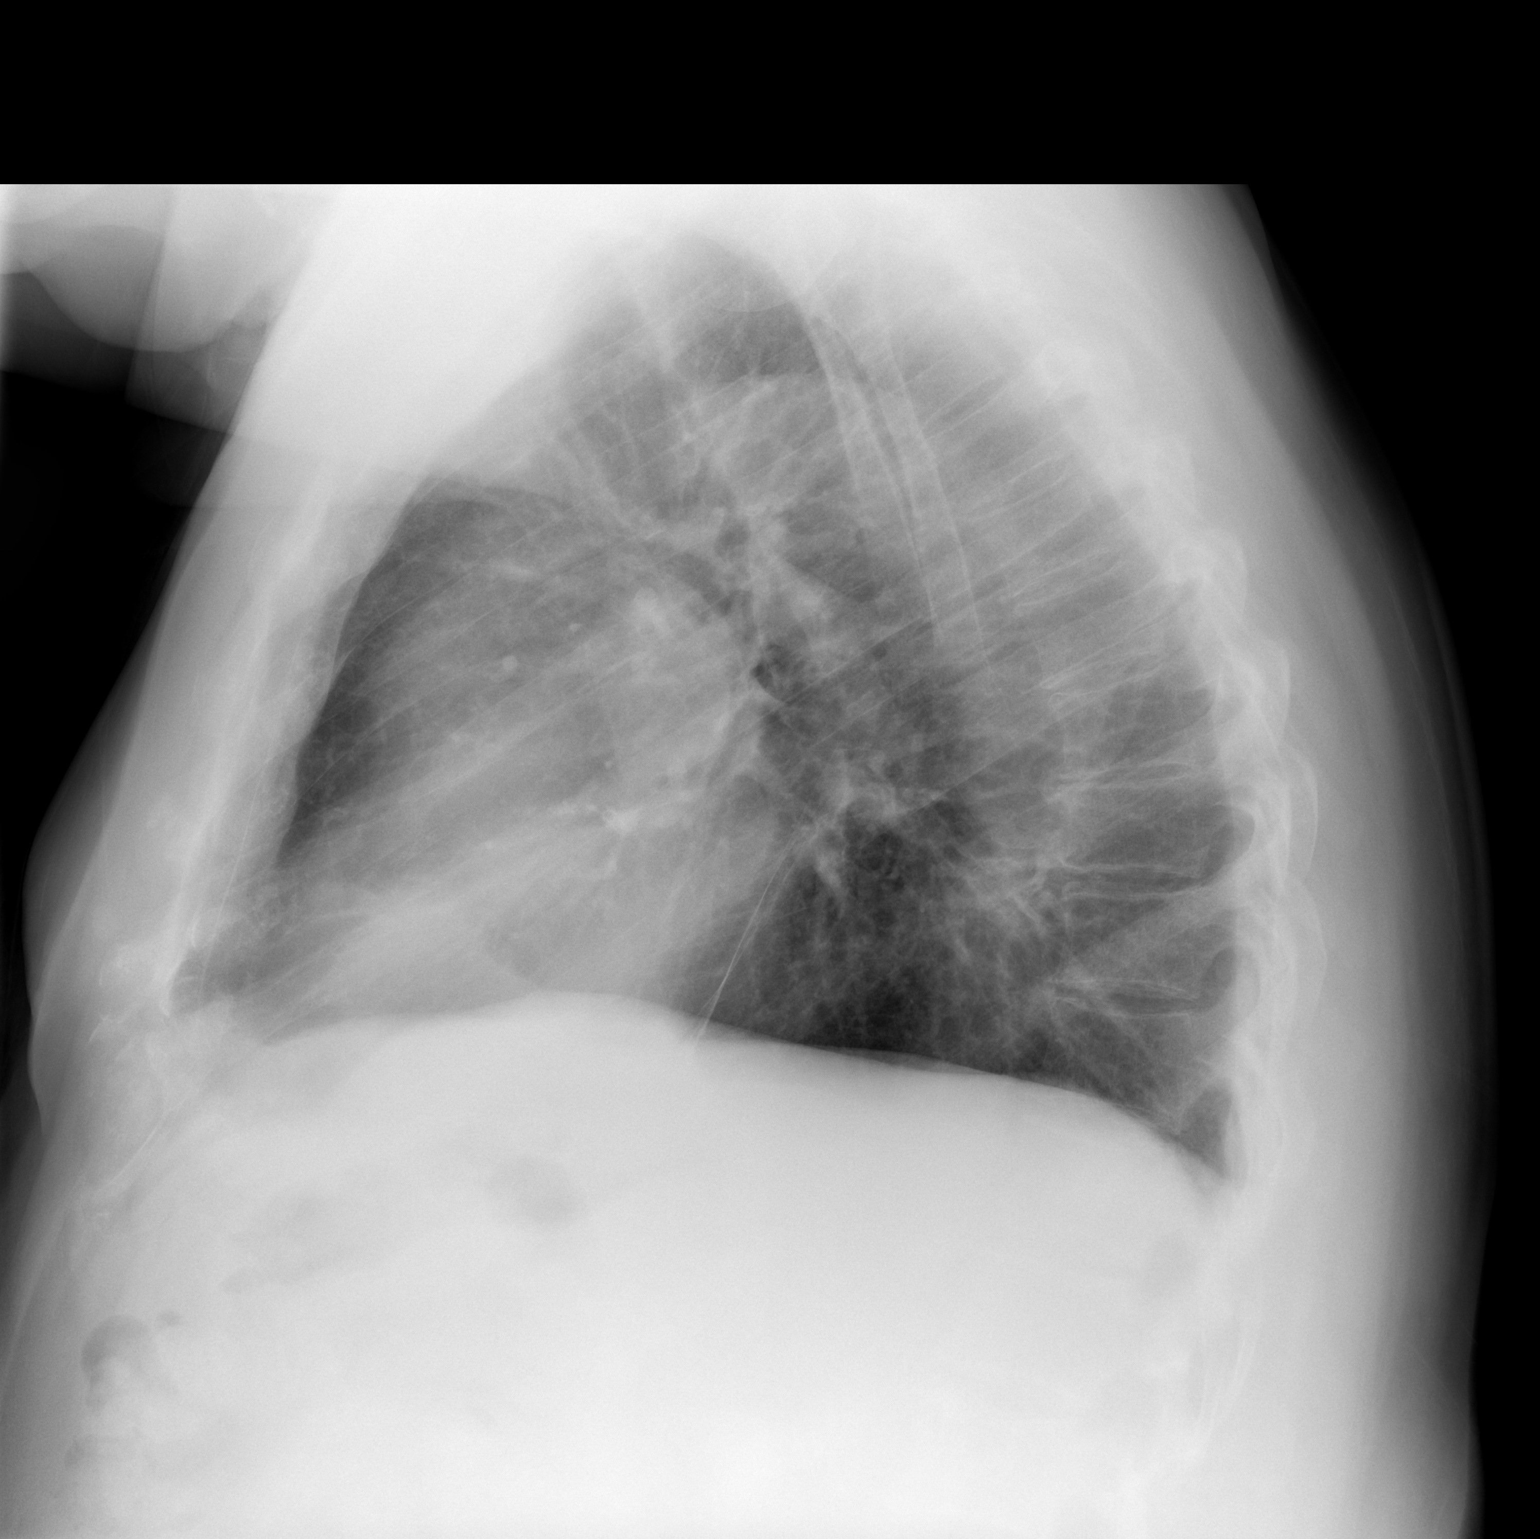

[2 of 2 positions shown; findings below may reference images not displayed]

FINDINGS: The lungs are reasonably well inflated. There is no focal
infiltrate. There is no pleural effusion. There is stable
subcentimeter areas of nodularity peripherally in the left upper
lobe. The cardiac silhouette is top-normal in size. The pulmonary
vascularity is not engorged. There is mild tortuosity of the
descending thoracic aorta. The trachea is midline. The bony thorax
is unremarkable.
IMPRESSION: There is mild stable enlargement of the cardiac silhouette without
pulmonary vascular congestion. There is no evidence of pneumonia nor
pulmonary edema.

## 2016-03-20 ENCOUNTER — Other Ambulatory Visit: Payer: Self-pay | Admitting: *Deleted

## 2016-03-20 DIAGNOSIS — I350 Nonrheumatic aortic (valve) stenosis: Secondary | ICD-10-CM

## 2016-03-31 ENCOUNTER — Ambulatory Visit
Admission: RE | Admit: 2016-03-31 | Discharge: 2016-03-31 | Disposition: A | Discharge status: Home / Self Care | Payer: Medicare HMO | Source: Ambulatory Visit | Attending: Family Medicine | Admitting: Family Medicine

## 2016-03-31 ENCOUNTER — Other Ambulatory Visit: Payer: Self-pay | Admitting: Family Medicine

## 2016-03-31 DIAGNOSIS — R05 Cough: Secondary | ICD-10-CM

## 2016-03-31 DIAGNOSIS — R059 Cough, unspecified: Secondary | ICD-10-CM

## 2016-03-31 DIAGNOSIS — R1310 Dysphagia, unspecified: Secondary | ICD-10-CM | POA: Diagnosis not present

## 2016-03-31 DIAGNOSIS — R1319 Other dysphagia: Secondary | ICD-10-CM

## 2016-03-31 DIAGNOSIS — R0602 Shortness of breath: Secondary | ICD-10-CM | POA: Diagnosis not present

## 2016-04-03 ENCOUNTER — Ambulatory Visit
Admission: RE | Admit: 2016-04-03 | Discharge: 2016-04-03 | Disposition: A | Discharge status: Home / Self Care | Payer: Medicare HMO | Source: Ambulatory Visit | Attending: Family Medicine | Admitting: Family Medicine

## 2016-04-03 DIAGNOSIS — R1319 Other dysphagia: Secondary | ICD-10-CM

## 2016-04-03 DIAGNOSIS — K219 Gastro-esophageal reflux disease without esophagitis: Secondary | ICD-10-CM | POA: Diagnosis not present

## 2016-04-03 DIAGNOSIS — R1310 Dysphagia, unspecified: Secondary | ICD-10-CM

## 2016-04-21 ENCOUNTER — Other Ambulatory Visit (HOSPITAL_COMMUNITY): Payer: Self-pay | Admitting: Gastroenterology

## 2016-04-21 DIAGNOSIS — R1310 Dysphagia, unspecified: Secondary | ICD-10-CM | POA: Diagnosis not present

## 2016-04-23 DIAGNOSIS — I110 Hypertensive heart disease with heart failure: Secondary | ICD-10-CM | POA: Diagnosis not present

## 2016-04-23 DIAGNOSIS — I25119 Atherosclerotic heart disease of native coronary artery with unspecified angina pectoris: Secondary | ICD-10-CM | POA: Diagnosis not present

## 2016-04-23 DIAGNOSIS — I700 Atherosclerosis of aorta: Secondary | ICD-10-CM | POA: Diagnosis not present

## 2016-04-23 DIAGNOSIS — E7800 Pure hypercholesterolemia, unspecified: Secondary | ICD-10-CM | POA: Diagnosis not present

## 2016-04-23 DIAGNOSIS — F0151 Vascular dementia with behavioral disturbance: Secondary | ICD-10-CM | POA: Diagnosis not present

## 2016-04-25 ENCOUNTER — Other Ambulatory Visit: Payer: Self-pay | Admitting: Cardiology

## 2016-04-29 ENCOUNTER — Ambulatory Visit (HOSPITAL_COMMUNITY): Admission: RE | Admit: 2016-04-29 | Payer: Medicare HMO | Source: Ambulatory Visit

## 2016-04-29 ENCOUNTER — Inpatient Hospital Stay (HOSPITAL_COMMUNITY): Admission: RE | Admit: 2016-04-29 | Payer: Self-pay | Source: Ambulatory Visit

## 2016-05-16 ENCOUNTER — Encounter: Payer: Self-pay | Admitting: *Deleted

## 2016-06-04 ENCOUNTER — Encounter: Payer: Self-pay | Admitting: Cardiovascular Disease

## 2016-06-04 ENCOUNTER — Other Ambulatory Visit: Payer: Self-pay

## 2016-06-04 ENCOUNTER — Ambulatory Visit (HOSPITAL_COMMUNITY): Payer: Medicare HMO | Attending: Cardiology

## 2016-06-04 ENCOUNTER — Ambulatory Visit (INDEPENDENT_AMBULATORY_CARE_PROVIDER_SITE_OTHER): Payer: Medicare HMO | Admitting: Cardiovascular Disease

## 2016-06-04 VITALS — BP 152/80 | HR 60 | Ht 74.0 in | Wt 250.8 lb

## 2016-06-04 DIAGNOSIS — I350 Nonrheumatic aortic (valve) stenosis: Secondary | ICD-10-CM

## 2016-06-04 DIAGNOSIS — I348 Other nonrheumatic mitral valve disorders: Secondary | ICD-10-CM | POA: Diagnosis not present

## 2016-06-04 DIAGNOSIS — Z952 Presence of prosthetic heart valve: Secondary | ICD-10-CM

## 2016-06-04 NOTE — Progress Notes (Signed)
[name] Follow Up Note  Chief Complaint  Patient presents with  . Follow-up   History of Present Illness: 83 yo male with history of severe aortic valve stenosis who is s/p [name] and here today for one year follow up. He is followed by Dr. Radford Pax. He underwent [name] on 05/29/15 with 29 mm Edwards Sapien 3 bioprosthetic valve. He did well following the procedure without complications. Echo one month Banks procedure with normal LV function and normally functioning bioprosthetic valve. He has been on ASA and Plavix.    He is here today for follow up. The patient denies any chest pain, dyspnea, palpitations, lower extremity edema, orthopnea, PND, dizziness, near syncope or syncope.    Primary Care Physician: [name], MD Primary Cardiologist: Radford Pax  Past Medical History:  Diagnosis Date  . Allergic rhinitis   . Anxiety   . Aortic insufficiency   . Aortic stenosis, severe   . Arthritis   . BPH (benign prostatic hypertrophy)   . Bruises easily   . Chronic diastolic congestive heart failure ([place])   . Chronic fatigue   . Coronary artery disease    s/p PCI of left cir and LAD 2005 and PCI of prox PDA 7/09  . Coronary atherosclerosis of native coronary artery   . Dementia    early per pt- recently evaluated at [facility]- eccho7/13  , MRI head EPIC 6/13  . Dementia, vascular    Dr [name]  . Essential hypertension, benign   . GERD (gastroesophageal reflux disease)   . HBP (high blood pressure)   . Headache(784.0)    relieved with OTC's  . Heart murmur   . Hyperlipidemia   . Hypertension   . Incontinence of urine   . Left carotid bruit    w minimal obstruction on doppler  . OAB (overactive bladder)   . Osteoarthritis of left knee   . Pure hypercholesterolemia   . S/P [name] (transcatheter aortic valve replacement) 05/29/2015   29 mm Edwards Sapien 3 transcatheter heart valve placed via percutaneous right transfemoral approach  . Shortness of breath dyspnea    with ADLs    . Unsteady gait     Past Surgical History:  Procedure Laterality Date  . CARDIAC CATHETERIZATION     3 stents first cath 2 stents placed then additional stent placed  . CARDIAC CATHETERIZATION N/A 04/19/2015   Procedure: Right/Left Heart Cath and Coronary Angiography;  Surgeon: [name], MD;  Location: [place] [facility];  Service: Cardiovascular;  Laterality: N/A;  . COLONOSCOPY W/ POLYPECTOMY    . colonscopy     . EYE SURGERY Bilateral    cataract surgery bilat   . HEMORROIDECTOMY    . KNEE ARTHROSCOPY  09/19/2011   Procedure: ARTHROSCOPY KNEE;  Surgeon: [name], MD;  Location: [facility];  Service: Orthopedics;  Laterality: Left;  . ROTATOR CUFF REPAIR Right 3/13   right  . stent placed     x3 per pt  . TEE WITHOUT CARDIOVERSION N/A 05/29/2015   Procedure: TRANSESOPHAGEAL ECHOCARDIOGRAM (TEE);  Surgeon: [name], MD;  Location: [place];  Service: Open Heart Surgery;  Laterality: N/A;  . TONSILLECTOMY    . TOTAL KNEE ARTHROPLASTY Left 02/21/2014   Procedure: LEFT TOTAL KNEE ARTHROPLASTY;  Surgeon: [name], MD;  Location: [facility];  Service: Orthopedics;  Laterality: Left;  . TRANSCATHETER AORTIC VALVE REPLACEMENT, TRANSFEMORAL N/A 05/29/2015   Procedure: TRANSCATHETER AORTIC VALVE REPLACEMENT, TRANSFEMORAL;  Surgeon: [name], MD;  Location: [facility];  Service: Open Heart Surgery;  Laterality: N/A;  . TRANSURETHRAL RESECTION OF PROSTATE    . TURP VAPORIZATION      Current Outpatient Prescriptions  Medication Sig Dispense Refill  . acetaminophen (TYLENOL) 325 MG tablet Take 650 mg by mouth every 6 (six) hours as needed for mild pain.    [name] Kitchen amLODipine (NORVASC) 5 MG tablet TAKE 1 TABLET (5 MG TOTAL) BY MOUTH DAILY. 30 tablet 7  . aspirin 81 MG tablet Take 81 mg by mouth daily.    . carvedilol (COREG) 12.5 MG tablet TAKE 1 TABLET TWICE DAILY WITH A MEAL 180 tablet 2  . chlorhexidine (PERIDEX) 0.12 % solution Rinse with 15 mls twice daily for 30  seconds. Use after breakfast and at bedtime. Spit out excess. Do not swallow. 960 mL prn  . clopidogrel (PLAVIX) 75 MG tablet Take 1 tablet (75 mg total) by mouth daily. 90 tablet 3  . fesoterodine (TOVIAZ) 4 MG TB24 tablet Take 4 mg by mouth daily.    . finasteride (PROSCAR) 5 MG tablet Take 5 mg by mouth daily.    . furosemide (LASIX) 20 MG tablet TAKE 1 TABLET EVERY DAY 90 tablet 2  . losartan (COZAAR) 50 MG tablet Take 1 tablet (50 mg total) by mouth daily. 90 tablet 3  . memantine (NAMENDA) 10 MG tablet Take 10 mg by mouth daily.    . montelukast (SINGULAIR) 10 MG tablet Take 10 mg by mouth daily.    . Multiple Vitamin (MULTIVITAMIN WITH MINERALS) TABS Take 1 tablet by mouth daily.    . nitroGLYCERIN (NITROSTAT) 0.4 MG SL tablet Take 0.4 mg by mouth as needed. Place one tablet under your tongue every 5 minutes as needed for chest pain    . omeprazole (PRILOSEC) 40 MG capsule Take 1 capsule (40 mg total) by mouth daily. 90 capsule 3  . potassium chloride SA (K-DUR,KLOR-CON) 20 MEQ tablet Take 1 tablet (20 mEq total) by mouth daily. 90 tablet 3  . rosuvastatin (CRESTOR) 20 MG tablet Take 10 mg by mouth daily.    . sertraline (ZOLOFT) 50 MG tablet Take 50 mg by mouth every morning.     . traMADol (ULTRAM) 50 MG tablet Take 50 mg by mouth 2 (two) times daily.     No current facility-administered medications for this visit.     Allergies  Allergen Reactions  . Latex Rash  . Sulfa Antibiotics Hives and Rash    Social History   Social History  . Marital status: Married    Spouse name: N/A  . Number of children: 3  . Years of education: N/A   Occupational History  . retired    Social History Main Topics  . Smoking status: Former Smoker    Years: 20.00    Types: Cigars    Quit date: 02/10/1970  . Smokeless tobacco: Former Systems developer    Types: Chew     Comment: smoked cigars off and on x 20 years  . Alcohol use 1.8 oz/week    3 Glasses of wine per week     Comment: weekly/    occ  beer/wine not recently (05/25/15)  . Drug use: No  . Sexual activity: Not Currently   Other Topics Concern  . Not on file   Social History Narrative  . No narrative on file    Family History  Problem Relation Age of Onset  . Lung cancer Father   . Anesthesia problems Neg Hx   .  Heart attack Neg Hx     Review of Systems:  As stated in the HPI and otherwise negative.   BP (!) 152/80   Pulse 60   Ht 6\' 2"  (1.88 m)   Wt 250 lb 12.8 oz (113.8 kg)   SpO2 95%   BMI 32.20 kg/m   Physical Examination:  General: Well developed, well nourished, NAD  HEENT: OP clear, mucus membranes moist  SKIN: warm, dry. No rashes. Neuro: No focal deficits  Musculoskeletal: Muscle strength 5/5 all ext  Psychiatric: Mood and affect normal  Neck: No JVD, no carotid bruits, no thyromegaly, no lymphadenopathy.  Lungs:Clear bilaterally, no wheezes, rhonci, crackles Cardiovascular: Regular rate and rhythm. No murmurs, gallops or rubs. Abdomen:Soft. Bowel sounds present. Non-tender.  Extremities: No lower extremity edema. Pulses are 2 + in the bilateral DP/PT.  EKG:  EKG is ordered today. The ekg ordered today demonstrates NSR, rate 60 bpm. LVH  Recent Labs: No results found for requested labs within last 8760 hours.   Lipid Panel    Component Value Date/Time   CHOL 141 10/20/2014 1111   TRIG 126.0 10/20/2014 1111   HDL 47.10 10/20/2014 1111   CHOLHDL 3 10/20/2014 1111   VLDL 25.2 10/20/2014 1111   LDLCALC 69 10/20/2014 1111     Wt Readings from Last 3 Encounters:  06/04/16 250 lb 12.8 oz (113.8 kg)  01/07/16 251 lb 12.8 oz (114.2 kg)  07/06/15 253 lb (114.8 kg)     Other studies Reviewed: Additional studies/ records that were reviewed today include: . Review of the above records demonstrates:    Assessment and Plan:   1. Severe aortic valve stenosis: He is one year out from his [name]. He is doing great. Echo today shows normal LV systolic function and normally functioning  bioprosthetic aortic valve. He is NYHA class 2. Will continue ASA and Plavix.   Current medicines are reviewed at length with the patient today.  The patient does not have concerns regarding medicines.  The following changes have been made:  no change  Labs/ tests ordered today include:  No orders of the defined types were placed in this encounter.  Follow up with Dr. Radford Pax in 6 months  Signed, Lauree Chandler, MD 06/04/2016 11:56 AM    [facility] [address], [place], [place]  [address] Phone: [phone]; Fax: [phone]

## 2016-06-04 NOTE — Patient Instructions (Addendum)
Your physician recommends that you continue on your current medications as directed. Please refer to the Current Medication list given to you today.   Your physician wants you to follow-up in:  6 MONTHS WITH DR TURNER.. You will receive a reminder letter in the mail two months in advance. If you don't receive a letter, please call our office to schedule the follow-up appointment.  

## 2016-06-05 ENCOUNTER — Other Ambulatory Visit: Payer: Self-pay | Admitting: Cardiology

## 2016-06-05 NOTE — Addendum Note (Signed)
Addended by: Mendel Ryder on: 06/05/2016 01:50 PM   Modules accepted: Orders

## 2016-06-10 NOTE — Addendum Note (Signed)
Addended by: Mendel Ryder on: 06/10/2016 10:42 AM   Modules accepted: Orders

## 2016-07-09 ENCOUNTER — Other Ambulatory Visit: Payer: Self-pay | Admitting: Cardiovascular Disease

## 2016-07-14 ENCOUNTER — Other Ambulatory Visit (HOSPITAL_COMMUNITY): Payer: Self-pay | Admitting: Dentistry

## 2016-07-14 ENCOUNTER — Other Ambulatory Visit: Payer: Self-pay | Admitting: Cardiology

## 2016-08-18 ENCOUNTER — Other Ambulatory Visit: Payer: Self-pay | Admitting: Cardiology

## 2016-08-18 NOTE — Telephone Encounter (Signed)
Medication Detail    Disp Refills Start End   carvedilol (COREG) 12.5 MG tablet 180 tablet 2 04/28/2016    Sig: TAKE 1 TABLET TWICE DAILY WITH A MEAL   E-Prescribing Status: Receipt confirmed by pharmacy (04/28/2016 8:32 AM EDT)   Pharmacy   [facility] [place], [place]

## 2016-08-28 DIAGNOSIS — Z961 Presence of intraocular lens: Secondary | ICD-10-CM | POA: Diagnosis not present

## 2016-08-28 DIAGNOSIS — H26492 Other secondary cataract, left eye: Secondary | ICD-10-CM | POA: Diagnosis not present

## 2016-08-28 DIAGNOSIS — H524 Presbyopia: Secondary | ICD-10-CM | POA: Diagnosis not present

## 2016-08-28 DIAGNOSIS — H52203 Unspecified astigmatism, bilateral: Secondary | ICD-10-CM | POA: Diagnosis not present

## 2016-09-03 DIAGNOSIS — H26492 Other secondary cataract, left eye: Secondary | ICD-10-CM | POA: Diagnosis not present

## 2016-10-17 DIAGNOSIS — M79606 Pain in leg, unspecified: Secondary | ICD-10-CM | POA: Diagnosis not present

## 2016-10-17 DIAGNOSIS — E7800 Pure hypercholesterolemia, unspecified: Secondary | ICD-10-CM | POA: Diagnosis not present

## 2016-10-31 ENCOUNTER — Other Ambulatory Visit: Payer: Self-pay | Admitting: Family Medicine

## 2016-10-31 ENCOUNTER — Ambulatory Visit
Admission: RE | Admit: 2016-10-31 | Discharge: 2016-10-31 | Disposition: A | Discharge status: Home / Self Care | Payer: Medicare HMO | Source: Ambulatory Visit | Attending: Family Medicine | Admitting: Family Medicine

## 2016-10-31 DIAGNOSIS — R0602 Shortness of breath: Secondary | ICD-10-CM | POA: Diagnosis not present

## 2016-10-31 DIAGNOSIS — F0151 Vascular dementia with behavioral disturbance: Secondary | ICD-10-CM | POA: Diagnosis not present

## 2016-10-31 DIAGNOSIS — I5030 Unspecified diastolic (congestive) heart failure: Secondary | ICD-10-CM | POA: Diagnosis not present

## 2016-10-31 DIAGNOSIS — N3281 Overactive bladder: Secondary | ICD-10-CM | POA: Diagnosis not present

## 2016-10-31 DIAGNOSIS — E7800 Pure hypercholesterolemia, unspecified: Secondary | ICD-10-CM | POA: Diagnosis not present

## 2016-10-31 DIAGNOSIS — I700 Atherosclerosis of aorta: Secondary | ICD-10-CM | POA: Diagnosis not present

## 2016-10-31 DIAGNOSIS — Z0000 Encounter for general adult medical examination without abnormal findings: Secondary | ICD-10-CM | POA: Diagnosis not present

## 2016-10-31 DIAGNOSIS — K219 Gastro-esophageal reflux disease without esophagitis: Secondary | ICD-10-CM | POA: Diagnosis not present

## 2016-10-31 DIAGNOSIS — Z23 Encounter for immunization: Secondary | ICD-10-CM | POA: Diagnosis not present

## 2016-10-31 DIAGNOSIS — I110 Hypertensive heart disease with heart failure: Secondary | ICD-10-CM | POA: Diagnosis not present

## 2016-10-31 DIAGNOSIS — R0989 Other specified symptoms and signs involving the circulatory and respiratory systems: Secondary | ICD-10-CM

## 2016-10-31 DIAGNOSIS — I25119 Atherosclerotic heart disease of native coronary artery with unspecified angina pectoris: Secondary | ICD-10-CM | POA: Diagnosis not present

## 2016-11-05 ENCOUNTER — Other Ambulatory Visit: Payer: Self-pay | Admitting: Family Medicine

## 2016-11-05 DIAGNOSIS — M79605 Pain in left leg: Principal | ICD-10-CM

## 2016-11-05 DIAGNOSIS — M79604 Pain in right leg: Secondary | ICD-10-CM

## 2016-11-14 ENCOUNTER — Ambulatory Visit
Admission: RE | Admit: 2016-11-14 | Discharge: 2016-11-14 | Disposition: A | Discharge status: Home / Self Care | Payer: Medicare HMO | Source: Ambulatory Visit | Attending: Family Medicine | Admitting: Family Medicine

## 2016-11-14 DIAGNOSIS — M79604 Pain in right leg: Secondary | ICD-10-CM

## 2016-11-14 DIAGNOSIS — M47816 Spondylosis without myelopathy or radiculopathy, lumbar region: Secondary | ICD-10-CM | POA: Diagnosis not present

## 2016-11-14 DIAGNOSIS — M79605 Pain in left leg: Principal | ICD-10-CM

## 2016-11-17 ENCOUNTER — Encounter: Payer: Self-pay | Admitting: Cardiology

## 2016-12-01 ENCOUNTER — Encounter: Payer: Self-pay | Admitting: Cardiology

## 2016-12-01 ENCOUNTER — Ambulatory Visit (INDEPENDENT_AMBULATORY_CARE_PROVIDER_SITE_OTHER): Payer: Medicare HMO | Admitting: Cardiology

## 2016-12-01 VITALS — BP 124/78 | HR 64 | Ht 74.0 in | Wt 258.0 lb

## 2016-12-01 DIAGNOSIS — E7800 Pure hypercholesterolemia, unspecified: Secondary | ICD-10-CM | POA: Diagnosis not present

## 2016-12-01 DIAGNOSIS — I739 Peripheral vascular disease, unspecified: Secondary | ICD-10-CM | POA: Diagnosis not present

## 2016-12-01 DIAGNOSIS — I350 Nonrheumatic aortic (valve) stenosis: Secondary | ICD-10-CM | POA: Diagnosis not present

## 2016-12-01 DIAGNOSIS — I5032 Chronic diastolic (congestive) heart failure: Secondary | ICD-10-CM

## 2016-12-01 DIAGNOSIS — I2510 Atherosclerotic heart disease of native coronary artery without angina pectoris: Secondary | ICD-10-CM | POA: Diagnosis not present

## 2016-12-01 DIAGNOSIS — I10 Essential (primary) hypertension: Secondary | ICD-10-CM

## 2016-12-01 NOTE — Addendum Note (Signed)
Addended by: Teressa Senter on: 12/01/2016 10:19 AM   Modules accepted: Orders

## 2016-12-01 NOTE — Patient Instructions (Signed)
Medication Instructions:  Your physician recommends that you continue on your current medications as directed. Please refer to the Current Medication list given to you today.   Labwork: None ordered  Testing/Procedures: Your physician has requested that you have a lower extremity arterial duplex. This test is an ultrasound of the arteries in the legs. It looks at arterial blood flow in the legs. Allow one hour for Lower Arterial scans. There are no restrictions or special instructions.   Follow-Up: Your physician wants you to follow-up in: 1 year with Dr. Turner. You will receive a reminder letter in the mail two months in advance. If you don't receive a letter, please call our office to schedule the follow-up appointment.   Any Other Special Instructions Will Be Listed Below (If Applicable).     If you need a refill on your cardiac medications before your next appointment, please call your pharmacy.   

## 2016-12-01 NOTE — Progress Notes (Signed)
Cardiology Office Note:    Date:  12/01/2016   ID:  [name], DOB 1933-12-03, MRN [id number]  PCP:  [name], MD  Cardiologist:  Fransico Him, MD   Referring MD: [name], MD   Chief Complaint  Patient presents with  . Coronary Artery Disease  . Hypertension  . Aortic Stenosis  . Hyperlipidemia    History of Present Illness:    [name] is a 83 y.o. male with a hx of severe aortic valve stenosis who is s/p TAVR on 05/29/15 with 29 mm Edwards Sapien 3 bioprosthetic valve. He also has a history of ASCAD s/p PCI of left cir and LAD 2005 and PCI of prox PDA 7/09.  He has HTN, hyperlipidemia and chronic diastolic CHF.    He is here today for followup and is doing well.  He denies any chest pain or pressure, SOB, DOE, PND, orthopnea, LE edema, dizziness, palpitations or syncope. He is compliant with his meds and is tolerating meds with no SE.  He complains of pain in his thighs and LEs when he walks.    Past Medical History:  Diagnosis Date  . Allergic rhinitis   . Anxiety   . Aortic insufficiency   . Aortic stenosis, severe    S/P TAVR  . Arthritis   . BPH (benign prostatic hypertrophy)   . Bruises easily   . Chronic diastolic congestive heart failure ([place])   . Chronic fatigue   . Coronary artery disease    s/p PCI of left cir and LAD 2005 and PCI of prox PDA 7/09  . Dementia    early per pt- recently evaluated at [facility]- eccho7/13  , MRI head EPIC 6/13  . Dementia, vascular    Dr [name]  . Essential hypertension, benign   . GERD (gastroesophageal reflux disease)   . HBP (high blood pressure)   . Headache(784.0)    relieved with OTC's  . Hyperlipidemia   . Hypertension   . Incontinence of urine   . Left carotid bruit    w minimal obstruction on doppler  . OAB (overactive bladder)   . Osteoarthritis of left knee   . Pure hypercholesterolemia   . 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   29 mm Edwards Sapien 3  transcatheter heart valve placed via percutaneous right transfemoral approach  . Unsteady gait     Past Surgical History:  Procedure Laterality Date  . CARDIAC CATHETERIZATION     3 stents first cath 2 stents placed then additional stent placed  . CARDIAC CATHETERIZATION N/A 04/19/2015   Procedure: Right/Left Heart Cath and Coronary Angiography;  Surgeon: [name], MD;  Location: [facility];  Service: Cardiovascular;  Laterality: N/A;  . COLONOSCOPY W/ POLYPECTOMY    . colonscopy     . EYE SURGERY Bilateral    cataract surgery bilat   . HEMORROIDECTOMY    . KNEE ARTHROSCOPY  09/19/2011   Procedure: ARTHROSCOPY KNEE;  Surgeon: [name], MD;  Location: [facility];  Service: Orthopedics;  Laterality: Left;  . ROTATOR CUFF REPAIR Right 3/13   right  . stent placed     x3 per pt  . TEE WITHOUT CARDIOVERSION N/A 05/29/2015   Procedure: TRANSESOPHAGEAL ECHOCARDIOGRAM (TEE);  Surgeon: [name], MD;  Location: [facility];  Service: Open Heart Surgery;  Laterality: N/A;  . TONSILLECTOMY    . TOTAL KNEE ARTHROPLASTY Left 02/21/2014   Procedure: LEFT TOTAL KNEE ARTHROPLASTY;  Surgeon: [name], MD;  Location: [facility];  Service: Orthopedics;  Laterality: Left;  . TRANSCATHETER AORTIC VALVE REPLACEMENT, TRANSFEMORAL N/A 05/29/2015   Procedure: TRANSCATHETER AORTIC VALVE REPLACEMENT, TRANSFEMORAL;  Surgeon: [name], MD;  Location: [address];  Service: Open Heart Surgery;  Laterality: N/A;  . TRANSURETHRAL RESECTION OF PROSTATE    . TURP VAPORIZATION      Current Medications: Current Meds  Medication Sig  . acetaminophen (TYLENOL) 325 MG tablet Take 650 mg by mouth every 6 (six) hours as needed for mild pain.  [name] Kitchen amLODipine (NORVASC) 5 MG tablet TAKE 1 TABLET (5 MG TOTAL) BY MOUTH DAILY.  [name] Kitchen aspirin 81 MG tablet Take 81 mg by mouth daily.  . carvedilol (COREG) 12.5 MG tablet TAKE 1 TABLET TWICE DAILY WITH A MEAL  . chlorhexidine (PERIDEX) 0.12 % solution  Rinse with 15 mls twice daily for 30 seconds. Use after breakfast and at bedtime. Spit out excess. Do not swallow.  . clopidogrel (PLAVIX) 75 MG tablet Take 1 tablet (75 mg total) by mouth daily.  . finasteride (PROSCAR) 5 MG tablet Take 5 mg by mouth daily.  . furosemide (LASIX) 20 MG tablet TAKE 1 TABLET EVERY DAY  . losartan (COZAAR) 50 MG tablet TAKE 1 TABLET EVERY DAY  . memantine (NAMENDA) 10 MG tablet Take 10 mg by mouth daily.  . montelukast (SINGULAIR) 10 MG tablet Take 10 mg by mouth daily.  . Multiple Vitamin (MULTIVITAMIN WITH MINERALS) TABS Take 1 tablet by mouth daily.  . nitroGLYCERIN (NITROSTAT) 0.4 MG SL tablet Take 0.4 mg by mouth as needed. Place one tablet under your tongue every 5 minutes as needed for chest pain  . omeprazole (PRILOSEC) 40 MG capsule TAKE 1 CAPSULE BY MOUTH DAILY  (SUBSTITUTED FOR PRILOSEC)  . Potassium Chloride ER 20 MEQ TBCR TAKE 1 TABLET EVERY DAY  . Pregabalin (LYRICA PO) Take by mouth 2 (two) times daily.     Allergies:   Latex; Sulfa antibiotics; and Sulfasalazine   Social History   Social History  . Marital status: Married    Spouse name: N/A  . Number of children: 3  . Years of education: N/A   Occupational History  . retired    Social History Main Topics  . Smoking status: Former Smoker    Years: 20.00    Types: Cigars    Quit date: 02/10/1970  . Smokeless tobacco: Former Systems developer    Types: Chew     Comment: smoked cigars off and on x 20 years  . Alcohol use 1.8 oz/week    3 Glasses of wine per week     Comment: weekly/    occ beer/wine not recently (05/25/15)  . Drug use: No  . Sexual activity: Not Currently   Other Topics Concern  . None   Social History Narrative  . None     Family History: The patient's family history includes Lung cancer in his father. There is no history of Anesthesia problems or Heart attack.  ROS:   Please see the history of present illness.    Review of Systems  Respiratory: Positive for cough.     Musculoskeletal: Positive for back pain and muscle cramps.  Neurological: Positive for loss of balance.    All other systems reviewed and negative.   EKGs/Labs/Other Studies Reviewed:    The following studies were reviewed today: none  EKG:  EKG is not ordered today.    Recent Labs: No results found for requested labs  within last 8760 hours.   Recent Lipid Panel    Component Value Date/Time   CHOL 141 10/20/2014 1111   TRIG 126.0 10/20/2014 1111   HDL 47.10 10/20/2014 1111   CHOLHDL 3 10/20/2014 1111   VLDL 25.2 10/20/2014 1111   LDLCALC 69 10/20/2014 1111    Physical Exam:    VS:  BP 124/78   Pulse 64   Ht 6\' 2"  (1.88 m)   Wt 258 lb (117 kg)   SpO2 98%   BMI 33.13 kg/m     Wt Readings from Last 3 Encounters:  12/01/16 258 lb (117 kg)  06/04/16 250 lb 12.8 oz (113.8 kg)  01/07/16 251 lb 12.8 oz (114.2 kg)     GEN:  Well nourished, well developed in no acute distress HEENT: Normal NECK: No JVD; No carotid bruits LYMPHATICS: No lymphadenopathy CARDIAC: RRR, no murmurs, rubs, gallops RESPIRATORY:  Clear to auscultation without rales, wheezing or rhonchi  ABDOMEN: Soft, non-tender, non-distended MUSCULOSKELETAL:  No edema; No deformity trace,  DP pulses SKIN: Warm and dry NEUROLOGIC:  Alert and oriented x 3 PSYCHIATRIC:  Normal affect   ASSESSMENT:    1. Atherosclerosis of native coronary artery of native heart without angina pectoris   2. Chronic diastolic congestive heart failure ([place])   3. Nonrheumatic aortic valve stenosis   4. Essential hypertension, benign   5. Pure hypercholesterolemia   6. Claudication [facility])    PLAN:    In order of problems listed above:  1.  ASCAD - s/p PCI of left cir and LAD 2005 and PCI of prox PDA 7/09.  He is doing well with no anginal symptoms.  He will continue on statin, ASA and Plavix.   2.  Chronic diastolic CHF - he appears euvolemic on exam today and his weight is stable.  He will continue on Lasix 20mg  daily.     3.  Severe AS - s/p TAVR on 05/29/15 with 29 mm Edwards Sapien 3 bioprosthetic valve. Echo 05/2016 showed stable AVR.  He will continue on ASA and Plavix.  4.  HTN - BP is well controlled on exam today.  He will continue on Losartan 50mg  daily, Carvedilol 12.5mg  BID and amlodipien 5mg  daily.   5.  Hyperlipidemia with LDL goal < 70.  He will continue on Crestor 20mg  daily.  LDL was 77 - I have encouraged him to eat more fruits and vegetable.    6.  Bilateral leg pain with exertion ? Claudication - he had LE dopplers in 2015 showing < 50% bilateral SFA stenosis - I will repeat ABIs and doppler as he is having worsening LE pain and MRI was unremarkable.    Medication Adjustments/Labs and Tests Ordered: Current medicines are reviewed at length with the patient today.  Concerns regarding medicines are outlined above.  No orders of the defined types were placed in this encounter.  No orders of the defined types were placed in this encounter.   Signed, Fransico Him, MD  12/01/2016 9:54 AM    [place]

## 2016-12-03 ENCOUNTER — Other Ambulatory Visit: Payer: Self-pay | Admitting: Cardiology

## 2016-12-03 DIAGNOSIS — I739 Peripheral vascular disease, unspecified: Secondary | ICD-10-CM

## 2016-12-12 ENCOUNTER — Encounter: Payer: Self-pay | Admitting: Neurology

## 2016-12-18 ENCOUNTER — Ambulatory Visit (HOSPITAL_COMMUNITY)
Admission: RE | Admit: 2016-12-18 | Discharge: 2016-12-18 | Disposition: A | Discharge status: Home / Self Care | Payer: Medicare HMO | Source: Ambulatory Visit | Attending: Cardiology | Admitting: Cardiology

## 2016-12-18 DIAGNOSIS — I739 Peripheral vascular disease, unspecified: Secondary | ICD-10-CM | POA: Insufficient documentation

## 2016-12-18 DIAGNOSIS — E785 Hyperlipidemia, unspecified: Secondary | ICD-10-CM | POA: Insufficient documentation

## 2016-12-18 DIAGNOSIS — Z87891 Personal history of nicotine dependence: Secondary | ICD-10-CM | POA: Diagnosis not present

## 2016-12-18 DIAGNOSIS — I10 Essential (primary) hypertension: Secondary | ICD-10-CM | POA: Diagnosis not present

## 2016-12-18 DIAGNOSIS — I2510 Atherosclerotic heart disease of native coronary artery without angina pectoris: Secondary | ICD-10-CM | POA: Insufficient documentation

## 2017-01-09 ENCOUNTER — Ambulatory Visit: Payer: Medicare HMO | Admitting: Cardiovascular Disease

## 2017-01-09 ENCOUNTER — Encounter: Payer: Self-pay | Admitting: Cardiovascular Disease

## 2017-01-09 VITALS — BP 122/84 | HR 60 | Ht 74.0 in | Wt 252.0 lb

## 2017-01-09 DIAGNOSIS — I10 Essential (primary) hypertension: Secondary | ICD-10-CM

## 2017-01-09 DIAGNOSIS — I739 Peripheral vascular disease, unspecified: Secondary | ICD-10-CM | POA: Diagnosis not present

## 2017-01-09 NOTE — Patient Instructions (Signed)
Medication Instructions: Your physician recommends that you continue on your current medications as directed. Please refer to the Current Medication list given to you today.   Follow-Up: Your physician recommends that you schedule a follow-up appointment as needed with Dr. Berry.    

## 2017-01-09 NOTE — Progress Notes (Signed)
01/09/2017 [name]   09/12/1933  [id number]  Primary Physician [name], MD Primary Cardiologist: Lorretta Harp MD Lupe Carney, [place]  HPI:  [name] is a 83 y.o. male married, father of 3 biologic children accompanied by his wife Joycelyn Schmid today. He was referred by Dr. Radford Pax for peripheral vascular evaluation because of pain in both his legs both when standing and walking. His history is remarkable for treated hypertension and hyperlipidemia. He does have ischemic heart disease status post stenting in the past as well as T aVR. He had Dopplers performed January 2015 that showed normal ABIs bilaterally and recent Dopplers performed 02/18/16 revealed normal ABIs bilaterally with occluded anterior tibials. He has severe pain in both feet when he stands and walks does not sound like claudication.   Current Meds  Medication Sig  . acetaminophen (TYLENOL) 325 MG tablet Take 650 mg by mouth every 6 (six) hours as needed for mild pain.  [name] Kitchen amLODipine (NORVASC) 5 MG tablet TAKE 1 TABLET (5 MG TOTAL) BY MOUTH DAILY.  [name] Kitchen aspirin 81 MG tablet Take 81 mg by mouth daily.  . carvedilol (COREG) 12.5 MG tablet TAKE 1 TABLET TWICE DAILY WITH A MEAL  . chlorhexidine (PERIDEX) 0.12 % solution Rinse with 15 mls twice daily for 30 seconds. Use after breakfast and at bedtime. Spit out excess. Do not swallow.  . clopidogrel (PLAVIX) 75 MG tablet Take 1 tablet (75 mg total) by mouth daily.  . finasteride (PROSCAR) 5 MG tablet Take 5 mg by mouth daily.  . furosemide (LASIX) 20 MG tablet TAKE 1 TABLET EVERY DAY  . losartan (COZAAR) 50 MG tablet TAKE 1 TABLET EVERY DAY  . memantine (NAMENDA) 10 MG tablet Take 10 mg by mouth daily.  . montelukast (SINGULAIR) 10 MG tablet Take 10 mg by mouth daily.  . Multiple Vitamin (MULTIVITAMIN WITH MINERALS) TABS Take 1 tablet by mouth daily.  . nitroGLYCERIN (NITROSTAT) 0.4 MG SL tablet Take 0.4 mg by mouth as needed. Place one tablet under your  tongue every 5 minutes as needed for chest pain  . omeprazole (PRILOSEC) 40 MG capsule TAKE 1 CAPSULE BY MOUTH DAILY  (SUBSTITUTED FOR PRILOSEC)  . Potassium Chloride ER 20 MEQ TBCR TAKE 1 TABLET EVERY DAY  . Pregabalin (LYRICA PO) Take by mouth 2 (two) times daily.  . traMADol (ULTRAM) 50 MG tablet as needed.     Allergies  Allergen Reactions  . Latex Rash  . Sulfa Antibiotics Hives and Rash  . Sulfasalazine Rash and Hives    Social History   Socioeconomic History  . Marital status: Married    Spouse name: Not on file  . Number of children: 3  . Years of education: Not on file  . Highest education level: Not on file  Social Needs  . Financial resource strain: Not on file  . Food insecurity - worry: Not on file  . Food insecurity - inability: Not on file  . Transportation needs - medical: Not on file  . Transportation needs - non-medical: Not on file  Occupational History  . Occupation: retired  Tobacco Use  . Smoking status: Former Smoker    Years: 20.00    Types: Cigars    Last attempt to quit: 02/10/1970    Years since quitting: 46.9  . Smokeless tobacco: Former Systems developer    Types: Chew  . Tobacco comment: smoked cigars off and on x 20 years  Substance and Sexual Activity  .  Alcohol use: Yes    Alcohol/week: 1.8 oz    Types: 3 Glasses of wine per week    Comment: weekly/    occ beer/wine not recently (05/25/15)  . Drug use: No  . Sexual activity: Not Currently  Other Topics Concern  . Not on file  Social History Narrative  . Not on file     Review of Systems: General: negative for chills, fever, night sweats or weight changes.  Cardiovascular: negative for chest pain, dyspnea on exertion, edema, orthopnea, palpitations, paroxysmal nocturnal dyspnea or shortness of breath Dermatological: negative for rash Respiratory: negative for cough or wheezing Urologic: negative for hematuria Abdominal: negative for nausea, vomiting, diarrhea, bright red blood per rectum,  melena, or hematemesis Neurologic: negative for visual changes, syncope, or dizziness All other systems reviewed and are otherwise negative except as noted above.    Blood pressure 122/84, pulse 60, height 6\' 2"  (1.88 m), weight 252 lb (114.3 kg).  General appearance: alert and no distress Neck: no adenopathy, no carotid bruit, no JVD, supple, symmetrical, trachea midline and thyroid not enlarged, symmetric, no tenderness/mass/nodules Lungs: clear to auscultation bilaterally Heart: regular rate and rhythm, S1, S2 normal, no murmur, click, rub or gallop Extremities: extremities normal, atraumatic, no cyanosis or edema Pulses: Diminished pedal pulses bilaterally Skin: Skin color, texture, turgor normal. No rashes or lesions Neurologic: Alert and oriented X 3, normal strength and tone. Normal symmetric reflexes. Normal coordination and gait  EKG sinus rhythm at 60 without ST or T-wave changes. I personally reviewed this EKG.  ASSESSMENT AND PLAN:   Claudication [facility]) Mr. Fries was referred by Dr. Radford Pax for evaluation of lifestyle limiting claudication. This has been on for several years. He does have a history of ischemic heart disease status post stenting as well as T aVR. He has hypertension and hyperlipidemia as well. Dopplers performed January 2015 showed ABIs of 1.1 bilaterally and Dopplers again performed 12/19/16 showed normal ABIs and no evidence of large vessel obstructive disease. His anterior tibial arteries were occluded proximally bilaterally but this I doubt is contributing to his symptoms.      Lorretta Harp MD FACP,FACC,FAHA, [facility] 01/09/2017 2:54 PM

## 2017-01-09 NOTE — Assessment & Plan Note (Signed)
[name] was referred by Dr. Radford Pax for evaluation of lifestyle limiting claudication. This has been on for several years. He does have a history of ischemic heart disease status post stenting as well as T aVR. He has hypertension and hyperlipidemia as well. Dopplers performed January 2015 showed ABIs of 1.1 bilaterally and Dopplers again performed 12/19/16 showed normal ABIs and no evidence of large vessel obstructive disease. His anterior tibial arteries were occluded proximally bilaterally but this I doubt is contributing to his symptoms.

## 2017-01-29 ENCOUNTER — Ambulatory Visit: Payer: Medicare HMO | Admitting: Neurology

## 2017-01-29 ENCOUNTER — Encounter: Payer: Self-pay | Admitting: Neurology

## 2017-01-29 VITALS — BP 146/84 | HR 61 | Ht 75.0 in | Wt 258.0 lb

## 2017-01-29 DIAGNOSIS — G629 Polyneuropathy, unspecified: Secondary | ICD-10-CM

## 2017-01-29 DIAGNOSIS — R261 Paralytic gait: Secondary | ICD-10-CM | POA: Diagnosis not present

## 2017-01-29 DIAGNOSIS — R292 Abnormal reflex: Secondary | ICD-10-CM

## 2017-01-29 DIAGNOSIS — R269 Unspecified abnormalities of gait and mobility: Secondary | ICD-10-CM

## 2017-01-29 MED ORDER — GABAPENTIN 300 MG PO CAPS: ORAL_CAPSULE | ORAL | 5 refills | Status: DC

## 2017-01-29 NOTE — Patient Instructions (Addendum)
1.  MRI thoracic and cervical spine  We have sent a referral to [facility] for your MRI and they will call you directly to schedule your appt. They are located at [place]. If you need to contact them directly please call [phone].   2.  Start gabapentin 300mg  at bedtime for one week, then increase to 1 tablet twice daily 3.  Call my office in 2 weeks with an update as to how the medication is working.  We can increase the dose, as long as it is not making you to sleepy

## 2017-01-29 NOTE — Progress Notes (Signed)
[facility] Clinic Note - Initial Visit   Date: 01/29/17  TAHJE BORAWSKI MRN: [id number] DOB: [birth year]   Dear Dr. Brigitte Pulse:  Thank you for your kind referral of [name] for consultation of bilateral leg pain. Although his history is well known to you, please allow [place] to reiterate it for the purpose of our medical record. The patient was accompanied to the clinic by wife who also provides collateral information.     History of Present Illness: [name] is a 83 y.o. right-handed Caucasian male with aortic stenosis s/p TAVR, CHF, CAD s/p PCI, vascular dementia, hypertension, GERD, hyperlipidemia, and BPH  presenting for evaluation of bilateral lower leg pain.  Pain has been present for at least 5-10 years and he has been evaluated by Dr. Gladstone Lighter in the past with most recent MRI lumbar spine showing no evidence of canal or foraminal stenosis.  He has also had a several vascular studies over the past few years, all which returned normal. His pain is described as tingling, burning, and achy.  Pain is always exacerbated by about 2-5 minutes of walking, and quickly improves with resting.  He falls and trips about once per month.  He has been using a gait assist device for the past 10 years.  He has tried tylenol and tramadol for pain which did not provide any relief.  He tried low dose Lyrica without any relief.   He denies any family history of neuropathy.    He has previously been evaluated by Dr. [name] at [facility] who diagnosed him with vascular dementia for which he takes namenda 10mg  BID.  He lives with wife in a one story level, he has 2 steps entering the home.  He is able to perform ADLs.  He does not drive, manage finances, or medications.   Out-side paper records, electronic medical record, and images have been reviewed where available and summarized as:  MRI lumbar spine 11/14/2016:  Ordinary mild age related degenerative changes, less than often seen at  this age. Minor disc bulges and mild lower lumbar facet arthritis. No stenosis. No edematous changes. No change since the myelogram of last year.  Arterial studies of the legs 12/18/2016: Right: Resting right ankle-brachial index is within normal range. No evidence of significant right lower extremity arterial disease. Left: Resting left ankle-brachial index is within normal range. No evidence of significant left lower extremity arterial disease.  MRI brain wwo contrast 08/07/2011:   1.  Atrophy and extensive white matter disease.  This likely reflects the sequelae of chronic microvascular ischemia. 2.  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  This is likely chronic as well. 3.  No acute intracranial abnormality. 4.  Remote lacunar infarcts of the basal ganglia and subcortical white matter as described.  Past Medical History:  Diagnosis Date  . Allergic rhinitis   . Anxiety   . Aortic insufficiency   . Aortic stenosis, severe    S/P TAVR  . Arthritis   . BPH (benign prostatic hypertrophy)   . Bruises easily   . Chronic diastolic congestive heart failure ([place])   . Chronic fatigue   . Coronary artery disease    s/p PCI of left cir and LAD 2005 and PCI of prox PDA 7/09  . Dementia    early per pt- recently evaluated at [facility]- eccho7/13  , MRI head EPIC 6/13  . Dementia, vascular    Dr [name]  .  Essential hypertension, benign   . GERD (gastroesophageal reflux disease)   . HBP (high blood pressure)   . Headache(784.0)    relieved with OTC's  . Hyperlipidemia   . Hypertension   . Incontinence of urine   . Left carotid bruit    w minimal obstruction on doppler  . OAB (overactive bladder)   . Osteoarthritis of left knee   . Pure hypercholesterolemia   . 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 Unsteady gait      Past Surgical History:  Procedure Laterality Date  . CARDIAC CATHETERIZATION     3 stents first cath 2 stents placed then additional stent placed  . CARDIAC CATHETERIZATION N/A 04/19/2015   Procedure: Right/Left Heart Cath and Coronary Angiography;  Surgeon: [name], MD;  Location: [place] [facility];  Service: Cardiovascular;  Laterality: N/A;  . COLONOSCOPY W/ POLYPECTOMY    . colonscopy     . EYE SURGERY Bilateral    cataract surgery bilat   . HEMORROIDECTOMY    . KNEE ARTHROSCOPY  09/19/2011   Procedure: ARTHROSCOPY KNEE;  Surgeon: [name], MD;  Location: [facility];  Service: Orthopedics;  Laterality: Left;  . ROTATOR CUFF REPAIR Right 3/13   right  . stent placed     x3 per pt  . TEE WITHOUT CARDIOVERSION N/A 05/29/2015   Procedure: TRANSESOPHAGEAL ECHOCARDIOGRAM (TEE);  Surgeon: [name], MD;  Location: [place];  Service: Open Heart Surgery;  Laterality: N/A;  . TONSILLECTOMY    . TOTAL KNEE ARTHROPLASTY Left 02/21/2014   Procedure: LEFT TOTAL KNEE ARTHROPLASTY;  Surgeon: [name], MD;  Location: [facility];  Service: Orthopedics;  Laterality: Left;  . TRANSCATHETER AORTIC VALVE REPLACEMENT, TRANSFEMORAL N/A 05/29/2015   Procedure: TRANSCATHETER AORTIC VALVE REPLACEMENT, TRANSFEMORAL;  Surgeon: [name], MD;  Location: [place];  Service: Open Heart Surgery;  Laterality: N/A;  . TRANSURETHRAL RESECTION OF PROSTATE    . TURP VAPORIZATION       Medications:  Outpatient Encounter Medications as of 01/29/2017  Medication Sig Note  . acetaminophen (TYLENOL) 325 MG tablet Take 650 mg by mouth every 6 (six) hours as needed for mild pain.   [name] Kitchen amLODipine (NORVASC) 5 MG tablet TAKE 1 TABLET (5 MG TOTAL) BY MOUTH DAILY.   [name] Kitchen aspirin 81 MG tablet Take 81 mg by mouth daily.   . carvedilol (COREG) 12.5 MG tablet TAKE 1 TABLET TWICE DAILY WITH A MEAL   . chlorhexidine (PERIDEX) 0.12 % solution Rinse with 15 mls twice daily for 30 seconds. Use after  breakfast and at bedtime. Spit out excess. Do not swallow.   . clopidogrel (PLAVIX) 75 MG tablet Take 1 tablet (75 mg total) by mouth daily.   . finasteride (PROSCAR) 5 MG tablet Take 5 mg by mouth daily.   . furosemide (LASIX) 20 MG tablet TAKE 1 TABLET EVERY DAY   . losartan (COZAAR) 50 MG tablet TAKE 1 TABLET EVERY DAY   . memantine (NAMENDA) 10 MG tablet Take 10 mg by mouth daily. 01/07/2016: Received from: External Pharmacy  . montelukast (SINGULAIR) 10 MG tablet Take 10 mg by mouth daily. 01/07/2016: Received from: [place]: 1 tablet  . Multiple Vitamin (MULTIVITAMIN WITH MINERALS) TABS Take 1 tablet by mouth daily. 03/19/2013: .   . nitroGLYCERIN (NITROSTAT) 0.4 MG SL tablet Take 0.4 mg by mouth  as needed. Place one tablet under your tongue every 5 minutes as needed for chest pain 01/07/2016: Received from: [place]: 1 tablet under the tongue  . omeprazole (PRILOSEC) 40 MG capsule TAKE 1 CAPSULE BY MOUTH DAILY  (SUBSTITUTED FOR PRILOSEC)   . Potassium Chloride ER 20 MEQ TBCR TAKE 1 TABLET EVERY DAY   . Pregabalin (LYRICA PO) Take by mouth 2 (two) times daily.   . traMADol (ULTRAM) 50 MG tablet as needed.   . gabapentin (NEURONTIN) 300 MG capsule Take 1 tablet at bedtime for one week, then increase to 1 tablet twice daily.    No facility-administered encounter medications on file as of 01/29/2017.      Allergies:  Allergies  Allergen Reactions  . Latex Rash  . Sulfa Antibiotics Hives and Rash  . Sulfasalazine Rash and Hives    Family History: Family History  Problem Relation Age of Onset  . Lung cancer Father   . Anesthesia problems Neg Hx   . Heart attack Neg Hx     Social History: Social History   Tobacco Use  . Smoking status: Former Smoker    Years: 20.00    Types: Cigars    Last attempt to quit: 02/10/1970    Years since quitting: 47.0  . Smokeless tobacco: Former Systems developer    Types: Chew  . Tobacco  comment: smoked cigars off and on x 20 years  Substance Use Topics  . Alcohol use: Yes    Alcohol/week: 1.8 oz    Types: 3 Glasses of wine per week    Comment: weekly/    occ beer/wine not recently (05/25/15)  . Drug use: No   Social History   Social History Narrative  . Not on file    Review of Systems:  CONSTITUTIONAL: No fevers, chills, night sweats, or weight loss.   EYES: No visual changes or eye pain ENT: No hearing changes.  No history of nose bleeds.   RESPIRATORY: No cough, wheezing +shortness of breath.   CARDIOVASCULAR: Negative for chest pain, and palpitations.   GI: Negative for abdominal discomfort, blood in stools or black stools.  No recent change in bowel habits.   GU:  No history of incontinence.   MUSCLOSKELETAL: +history of joint pain or swelling.  No myalgias.   SKIN: Negative for lesions, rash, and itching.   HEMATOLOGY/ONCOLOGY: Negative for prolonged bleeding, bruising easily, and swollen nodes.  No history of cancer.   ENDOCRINE: Negative for cold or heat intolerance, polydipsia or goiter.   PSYCH:  No depression or anxiety symptoms.   NEURO: As Above.   Vital Signs:  BP (!) 146/84   Pulse 61   Ht 6\' 3"  (1.905 m)   Wt 258 lb (117 kg)   SpO2 95%   BMI 32.25 kg/m     General Medical Exam:   General:  Well appearing, comfortable.   Eyes/ENT: see cranial nerve examination.   Neck: No masses appreciated.  Full range of motion without tenderness.  No carotid bruits. Respiratory:  Clear to auscultation, good air entry bilaterally.   Cardiac:  Regular rate and rhythm, no murmur.   Extremities: Loss of distal soft tissue and muscle mass in the legs.  Pes cavus bilaterally. Skin:  No rashes or lesions.  Neurological Exam: MENTAL STATUS including orientation to time, place, person, recent and remote memory, attention span and concentration, language, and fund of knowledge is normal.  Speech is not dysarthric.  CRANIAL NERVES: II:  No  visual field  defects.  Unremarkable fundi.   III-IV-VI: Pupils equal round and reactive to light.  Normal conjugate, extra-ocular eye movements in all directions of gaze.  No nystagmus.  No ptosis.   V:  Normal facial sensation.     VII:  Normal facial symmetry and movements.    VIII:  Normal hearing and vestibular function.   IX-X:  Normal palatal movement.   XI:  Normal shoulder shrug and head rotation.   XII:  Normal tongue strength and range of motion, no deviation or fasciculation.  MOTOR:  Moderate muscle atrophy in the lower legs, posteriorly and anterior compartments. No fasciculations or tremors.   No pronator drift.     Right Upper Extremity:    Left Upper Extremity:    Deltoid  5/5   Deltoid  5/5   Biceps  5/5   Biceps  5/5   Triceps  5/5   Triceps  5/5   Wrist extensors  5/5   Wrist extensors  5/5   Wrist flexors  5/5   Wrist flexors  5/5   Finger extensors  5/5   Finger extensors  5/5   Finger flexors  5/5   Finger flexors  5/5   Dorsal interossei  5/5   Dorsal interossei  5/5   Abductor pollicis  5/5   Abductor pollicis  5/5   Tone (Ashworth scale)  0  Tone (Ashworth scale)  0   Right Lower Extremity:    Left Lower Extremity:    Hip flexors  5/5   Hip flexors  5/5   Hip extensors  5/5   Hip extensors  5/5   Knee flexors  5/5   Knee flexors  5/5   Knee extensors  5/5   Knee extensors  5/5   Dorsiflexors  5-/5   Dorsiflexors  5-/5   Plantarflexors  5-/5   Plantarflexors  5-/5   Toe extensors  5-/5   Toe extensors  5-/5   Toe flexors  5-/5   Toe flexors  5-/5   Tone (Ashworth scale)  0+  Tone (Ashworth scale)  0+   MSRs:  Right                                                                 Left brachioradialis 3+  brachioradialis 3+  biceps 3+  biceps 3+  triceps 3+  triceps 3+  patellar 3+  patellar 3+  ankle jerk 2+  ankle jerk 2+  Hoffman no  Hoffman no  plantar response down  plantar response down   SENSORY:  Vibration is absent distal to ankles bilaterally.   Temperature is also reduced distal to ankles.  Pin prick and proprioception is intact.    COORDINATION/GAIT: Normal finger-to- nose-finger.  Intact rapid alternating movements bilaterally.  Unable to rise from a chair without using arms.  Gait is wide-based, small steps and heavily assisted with cane.   IMPRESSION: Mr. Mattioli is a 83 year-old man referred for evaluation of bilateral exertional leg pain and paresthesias.  Prior work-up has including arterial studies and MRI lumbar spine which are normal.   Based on history, symptoms are consistent with claudication, but his imaging shows neither vascular or neurogenic claudication.  With his brisk reflexes and somewhat spastic  gait, I will order MRI thoracic and cervical spine.  He may also have some degree of neuropathy given his distal sensory loss and muscle atrophy, but I would expect absent reflexes.  If his imaging is unrevealing, will need to proceed with NCS/EMG of the legs.  In the meantime, start gabapentin 300mg  at bedtime x1 week, then increase to 1 tablet twice daily for pain.  Further recommendations will be based on his results.  The duration of this appointment visit was 50 minutes of face-to-face time with the patient.  Greater than 50% of this time was spent in counseling, explanation of diagnosis, planning of further management, and coordination of care.   Thank you for allowing me to participate in patient's care.  If I can answer any additional questions, I would be pleased to do so.    Sincerely,    Lenora Gomes K. Posey Pronto, DO

## 2017-02-16 ENCOUNTER — Telehealth: Payer: Self-pay | Admitting: Neurology

## 2017-02-16 NOTE — Telephone Encounter (Signed)
FYI

## 2017-02-16 NOTE — Telephone Encounter (Signed)
Good to know.  Do we have his MRIs scheduled?  Donika K. Posey Pronto, DO

## 2017-02-16 NOTE — Telephone Encounter (Signed)
I called [facility] and they said that they have tried to reach patient but phone was busy 1st time and no answer and no voicemail 2nd time.  They will try to contact him again.

## 2017-02-16 NOTE — Telephone Encounter (Signed)
Patient's wife called to let Dr. Posey Pronto know that the medication he was put on for his Legs has really helped and that he is doing well. She said thank you.

## 2017-02-26 ENCOUNTER — Encounter: Payer: Medicare HMO | Attending: Physical Medicine & Rehabilitation | Admitting: Physical Medicine & Rehabilitation

## 2017-02-26 ENCOUNTER — Encounter: Payer: Self-pay | Admitting: Physical Medicine & Rehabilitation

## 2017-02-26 VITALS — BP 132/85 | HR 60 | Resp 14

## 2017-02-26 DIAGNOSIS — E669 Obesity, unspecified: Secondary | ICD-10-CM | POA: Diagnosis not present

## 2017-02-26 DIAGNOSIS — I110 Hypertensive heart disease with heart failure: Secondary | ICD-10-CM | POA: Diagnosis not present

## 2017-02-26 DIAGNOSIS — M791 Myalgia, unspecified site: Secondary | ICD-10-CM | POA: Diagnosis not present

## 2017-02-26 DIAGNOSIS — M792 Neuralgia and neuritis, unspecified: Secondary | ICD-10-CM | POA: Insufficient documentation

## 2017-02-26 DIAGNOSIS — F419 Anxiety disorder, unspecified: Secondary | ICD-10-CM | POA: Insufficient documentation

## 2017-02-26 DIAGNOSIS — R296 Repeated falls: Secondary | ICD-10-CM | POA: Insufficient documentation

## 2017-02-26 DIAGNOSIS — G8929 Other chronic pain: Secondary | ICD-10-CM | POA: Insufficient documentation

## 2017-02-26 DIAGNOSIS — M79605 Pain in left leg: Secondary | ICD-10-CM | POA: Insufficient documentation

## 2017-02-26 DIAGNOSIS — Z9889 Other specified postprocedural states: Secondary | ICD-10-CM | POA: Insufficient documentation

## 2017-02-26 DIAGNOSIS — Z87891 Personal history of nicotine dependence: Secondary | ICD-10-CM | POA: Insufficient documentation

## 2017-02-26 DIAGNOSIS — I5032 Chronic diastolic (congestive) heart failure: Secondary | ICD-10-CM | POA: Diagnosis not present

## 2017-02-26 DIAGNOSIS — K219 Gastro-esophageal reflux disease without esophagitis: Secondary | ICD-10-CM | POA: Insufficient documentation

## 2017-02-26 DIAGNOSIS — I2510 Atherosclerotic heart disease of native coronary artery without angina pectoris: Secondary | ICD-10-CM | POA: Diagnosis not present

## 2017-02-26 DIAGNOSIS — M79604 Pain in right leg: Secondary | ICD-10-CM | POA: Insufficient documentation

## 2017-02-26 DIAGNOSIS — R269 Unspecified abnormalities of gait and mobility: Secondary | ICD-10-CM | POA: Diagnosis not present

## 2017-02-26 DIAGNOSIS — R531 Weakness: Secondary | ICD-10-CM | POA: Insufficient documentation

## 2017-02-26 DIAGNOSIS — F0390 Unspecified dementia without behavioral disturbance: Secondary | ICD-10-CM | POA: Insufficient documentation

## 2017-02-26 MED ORDER — DICLOFENAC SODIUM 1 % TD GEL: 2.0000 g | Freq: Four times a day (QID) | TRANSDERMAL | 1 refills | Status: AC

## 2017-02-26 NOTE — Progress Notes (Signed)
Subjective:    Patient ID: [name], male    DOB: [birth year], 84 y.o.   MRN: [id number]  HPI 84 y/o male with pmh/psh of generalized OA, CHF, dementia, HTN, GERD, CAD, anxiety, left TKR, right rotator cuff repair presents with leg pain, bilateral.  Wife present, who provides significant history.  Started ~2012, stable/getting worse. Denies inciting event. Patient saw Neurology and was started on Gabapentin, which has given significant benefit. Standing on feet for long periods exacerbates the pain.  Intermittent radiation.  Burning/tingling.  Associated weakness in left leg.  Fall due to weakness.  Pain limits ADLs.  Pain Inventory Average Pain 9 Pain Right Now 0 My pain is tingling and aching  In the last 24 hours, has pain interfered with the following? General activity 10 Relation with others 9 Enjoyment of life 6 What TIME of day is your pain at its worst? morning, night Sleep (in general) Good  Pain is worse with: walking and standing Pain improves with: heat/ice and medication Relief from Meds: n/a  Mobility walk with assistance use a cane use a walker do you drive?  no  Function not employed: date last employed . I need assistance with the following:  meal prep, household duties and shopping  Neuro/Psych bladder control problems weakness tingling trouble walking confusion anxiety  Prior Studies Any changes since last visit?  no  Physicians involved in your care Any changes since last visit?  no   Family History  Problem Relation Age of Onset  . Lung cancer Father   . Anesthesia problems Neg Hx   . Heart attack Neg Hx    Social History   Socioeconomic History  . Marital status: Married    Spouse name: None  . Number of children: 3  . Years of education: None  . Highest education level: None  Social Needs  . Financial resource strain: None  . Food insecurity - worry: None  . Food insecurity - inability: None  . Transportation needs -  medical: None  . Transportation needs - non-medical: None  Occupational History  . Occupation: retired  Tobacco Use  . Smoking status: Former Smoker    Years: 20.00    Types: Cigars    Last attempt to quit: 02/10/1970    Years since quitting: 47.0  . Smokeless tobacco: Former Systems developer    Types: Chew  . Tobacco comment: smoked cigars off and on x 20 years  Substance and Sexual Activity  . Alcohol use: Yes    Alcohol/week: 1.8 oz    Types: 3 Glasses of wine per week    Comment: weekly/    occ beer/wine not recently (05/25/15)  . Drug use: No  . Sexual activity: Not Currently  Other Topics Concern  . None  Social History Narrative  . None   Past Surgical History:  Procedure Laterality Date  . CARDIAC CATHETERIZATION     3 stents first cath 2 stents placed then additional stent placed  . CARDIAC CATHETERIZATION N/A 04/19/2015   Procedure: Right/Left Heart Cath and Coronary Angiography;  Surgeon: [name], MD;  Location: [place] [facility];  Service: Cardiovascular;  Laterality: N/A;  . COLONOSCOPY W/ POLYPECTOMY    . colonscopy     . EYE SURGERY Bilateral    cataract surgery bilat   . HEMORROIDECTOMY    . KNEE ARTHROSCOPY  09/19/2011   Procedure: ARTHROSCOPY KNEE;  Surgeon: [name], MD;  [facility];  Service: Orthopedics;  Laterality:  Left;  . ROTATOR CUFF REPAIR Right 3/13   right  . stent placed     x3 per pt  . TEE WITHOUT CARDIOVERSION N/A 05/29/2015   Procedure: TRANSESOPHAGEAL ECHOCARDIOGRAM (TEE);  Surgeon: [name], MD;  Location: [place];  Service: Open Heart Surgery;  Laterality: N/A;  . TONSILLECTOMY    . TOTAL KNEE ARTHROPLASTY Left 02/21/2014   Procedure: LEFT TOTAL KNEE ARTHROPLASTY;  Surgeon: [name], MD;  [facility];  Service: Orthopedics;  Laterality: Left;  . TRANSCATHETER AORTIC VALVE REPLACEMENT, TRANSFEMORAL N/A 05/29/2015   Procedure: TRANSCATHETER AORTIC VALVE REPLACEMENT, TRANSFEMORAL;  Surgeon: [name], MD;  Location: [place];  Service: Open Heart Surgery;  Laterality: N/A;  . TRANSURETHRAL RESECTION OF PROSTATE    . TURP VAPORIZATION     Past Medical History:  Diagnosis Date  . Allergic rhinitis   . Anxiety   . Aortic insufficiency   . Aortic stenosis, severe    S/P TAVR  . Arthritis   . BPH (benign prostatic hypertrophy)   . Bruises easily   . Chronic diastolic congestive heart failure ([place])   . Chronic fatigue   . Coronary artery disease    s/p PCI of left cir and LAD 2005 and PCI of prox PDA 7/09  . Dementia    early per pt- recently evaluated at [facility]- eccho7/13  , MRI head EPIC 6/13  . Dementia, vascular    Dr [name]  . Essential hypertension, benign   . GERD (gastroesophageal reflux disease)   . HBP (high blood pressure)   . Headache(784.0)    relieved with OTC's  . Hyperlipidemia   . Hypertension   . Incontinence of urine   . Left carotid bruit    w minimal obstruction on doppler  . OAB (overactive bladder)   . Osteoarthritis of left knee   . Pure hypercholesterolemia   . 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 Unsteady gait    BP 132/85   Pulse 60   Resp 14   SpO2 94%   Opioid Risk Score:   Fall Risk Score:  `1  Depression screen PHQ 2/9  Depression screen PHQ 2/9 02/26/2017  Decreased Interest 1  Down, Depressed, Hopeless 0  PHQ - 2 Score 1  Altered sleeping 0  Tired, decreased energy 3  Change in appetite 1  Feeling bad or failure about yourself  1  Trouble concentrating 1  Moving slowly or fidgety/restless 2  Suicidal thoughts 0  PHQ-9 Score 9    Review of Systems  Constitutional: Positive for unexpected weight change.  HENT: Negative.   Eyes: Negative.   Respiratory: Positive for cough and shortness of breath.   Cardiovascular: Negative.   Gastrointestinal: Negative.   Endocrine: Negative.     Genitourinary: Positive for difficulty urinating.  Musculoskeletal: Positive for arthralgias, gait problem and myalgias.  Skin: Negative.   Allergic/Immunologic: Negative.   Neurological: Positive for weakness.       Tingling   Hematological: Negative.   Psychiatric/Behavioral: Positive for confusion. The patient is nervous/anxious.   All other systems reviewed and are negative.      Objective:   Physical Exam Gen: NAD. Vital signs reviewed HENT: Normocephalic, Atraumatic Eyes: EOMI. No discharge.  Cardio: RRR. No JVD. Pulm: B/l clear to auscultation.  Effort normal Abd: Soft, BS+ MSK:  Gait antalgic.   TTP b/l  SI joints.    No edema.   Pes Cavus b/l Neuro: CN II-XII grossly intact.    Sensation intact to light touch in all LE dermatomes  Reflexes 2+ throughout  Strength  5/5 in all LE myotomes  SLR neg b/l  HOH Skin: Warm and Dry. Intact    Assessment & Plan:  84 y/o male with pmh/psh of generalized OA, CHF,  dementia, HTN, GERD, CAD, anxiety, left TKR, right rotator cuff repair presents with leg pain, bilateral.  1. Chronic leg pain  With neuropathic component  MRI from 11/2016 reviewed, revealing mild DDD  Pt had ?NCS/EMG, will obtain report  Labs reviewed  Referral information reviewed  Cont Heat  Cont Gabapentin 300 BID  Cont follow up with Neurology  Will refer to PT for eval and treat of gait abnormality and TENs eval  Will Voltaren gel  Will consider Cymbalta  Will consider Robaxin    2. Gait abnormality with falls  PT ordered  Cont Cane for safety, will consider rolling walker based on therapy eval  3. Obesity  Does not want referral to dietitian at present  4. Myalgia   Will consider trigger point injections  5. B/l Pes Cavus  Will consider referral to Orthotics

## 2017-02-28 ENCOUNTER — Other Ambulatory Visit: Payer: Self-pay | Admitting: Cardiology

## 2017-03-06 ENCOUNTER — Ambulatory Visit: Payer: Self-pay | Admitting: Neurology

## 2017-03-12 ENCOUNTER — Other Ambulatory Visit: Payer: Self-pay

## 2017-03-12 ENCOUNTER — Ambulatory Visit: Payer: Medicare HMO | Attending: Physical Medicine & Rehabilitation | Admitting: Physical Therapy

## 2017-03-12 ENCOUNTER — Encounter: Payer: Self-pay | Admitting: Physical Therapy

## 2017-03-12 DIAGNOSIS — R2681 Unsteadiness on feet: Secondary | ICD-10-CM | POA: Diagnosis not present

## 2017-03-12 NOTE — Therapy (Signed)
[facility] [place], [place], [address] Phone: [phone]   Fax:  [phone]  Physical Therapy Evaluation  Patient Details  Name: [name] MRN: [id number] Date of Birth: 07/28/1933 Referring Provider: Jamse Arn MD.   Encounter Date: 03/12/2017  PT End of Session - 03/12/17 1241    Visit Number  1    Number of Visits  1    Date for PT Re-Evaluation  03/12/17    PT Start Time  0953    PT Stop Time  1040    PT Time Calculation (min)  47 min    Activity Tolerance  Patient tolerated treatment well    Behavior During Therapy  [facility] for tasks assessed/performed       Past Medical History:  Diagnosis Date  . Allergic rhinitis   . Anxiety   . Aortic insufficiency   . Aortic stenosis, severe    S/P TAVR  . Arthritis   . BPH (benign prostatic hypertrophy)   . Bruises easily   . Chronic diastolic congestive heart failure ([place])   . Chronic fatigue   . Coronary artery disease    s/p PCI of left cir and LAD 2005 and PCI of prox PDA 7/09  . Dementia    early per pt- recently evaluated at [facility]- eccho7/13  , MRI head EPIC 6/13  . Dementia, vascular    Dr [name]  . Essential hypertension, benign   . GERD (gastroesophageal reflux disease)   . HBP (high blood pressure)   . Headache(784.0)    relieved with OTC's  . Hyperlipidemia   . Hypertension   . Incontinence of urine   . Left carotid bruit    w minimal obstruction on doppler  . OAB (overactive bladder)   . Osteoarthritis of left knee   . Pure hypercholesterolemia   . 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 Unsteady gait     Past Surgical History:  Procedure Laterality Date  . CARDIAC CATHETERIZATION     3 stents first cath 2 stents placed then additional stent placed  . CARDIAC CATHETERIZATION N/A 04/19/2015   Procedure: Right/Left Heart Cath and Coronary Angiography;  Surgeon: [name], MD;  Location: [place] CV [facility];  Service: Cardiovascular;  Laterality: N/A;  . COLONOSCOPY W/ POLYPECTOMY    . colonscopy     . EYE SURGERY Bilateral    cataract surgery bilat   . HEMORROIDECTOMY    . KNEE ARTHROSCOPY  09/19/2011   Procedure: ARTHROSCOPY KNEE;  Surgeon: [name], MD;  [facility]: [facility];  Service: Orthopedics;  Laterality: Left;  . ROTATOR CUFF REPAIR Right 3/13   right  . stent placed     x3 per pt  . TEE WITHOUT CARDIOVERSION N/A 05/29/2015   Procedure: TRANSESOPHAGEAL ECHOCARDIOGRAM (TEE);  Surgeon: [name], MD;  Location: [place];  Service: Open Heart Surgery;  Laterality: N/A;  . TONSILLECTOMY    . TOTAL KNEE ARTHROPLASTY Left 02/21/2014   Procedure: LEFT TOTAL KNEE ARTHROPLASTY;  Surgeon: [name], MD;  [facility];  Service: Orthopedics;  Laterality: Left;  . TRANSCATHETER AORTIC VALVE REPLACEMENT, TRANSFEMORAL N/A 05/29/2015   Procedure: TRANSCATHETER AORTIC VALVE REPLACEMENT, TRANSFEMORAL;  Surgeon: [name], MD;  Location: [place];  Service: Open Heart Surgery;  Laterality: N/A;  . TRANSURETHRAL RESECTION OF PROSTATE    .  TURP VAPORIZATION      There were no vitals filed for this visit.   Subjective Assessment - 03/12/17 1248    Subjective  The patient presents to OPPT for assessment.  He is interested in only one visit due to a high co-pay.  He alos states that he has been put on gabapentin which has helped a lot.  If fact he states "I feel like I'm in [place]."  He states his LE pain is around a 5/10 with medication but a 9-10/10 with medication. He reports he has fallen over the last 6 months but now uses a cane full-time and on occasions a walker.    Pertinent History  Knee surgery; Heart surgery.    Limitations  Walking    How long can you walk comfortably?  Can walk safely with an assistive device community distances.    Patient  Stated Goals  Get in better shape.  I need to exercise more.    Currently in Pain?  Yes    Pain Score  5     Pain Location  Leg    Pain Orientation  Right;Left    Pain Descriptors / Indicators  -- "Hurts".    Pain Type  Chronic pain    Pain Onset  More than a month ago    Pain Frequency  Constant    Aggravating Factors   No meds.    Pain Relieving Factors  Meds.         [facility] PT Assessment - 03/12/17 0001      Assessment   Medical Diagnosis  Chronic pain of both lower extremities.    Referring Provider  Jamse Arn MD.    Onset Date/Surgical Date  -- Ongoing.      Precautions   Precautions  Fall      Restrictions   Weight Bearing Restrictions  No      Balance Screen   Has the patient fallen in the past 6 months  Yes    How many times?  -- 4.    Has the patient had a decrease in activity level because of a fear of falling?   Yes    Is the patient reluctant to leave their home because of a fear of falling?   Yes      Home Environment   Additional Comments  4 stairs into house with railing.      Prior Function   Level of Independence  Requires assistive device for independence      Coordination   Gross Motor Movements are Fluid and Coordinated  Yes    Heel Shin Test  -- WNL bilaterally.      Posture/Postural Control   Posture/Postural Control  Postural limitations    Postural Limitations  Rounded Shoulders;Forward head;Decreased lumbar lordosis      ROM / Strength   AROM / PROM / Strength  AROM;Strength      AROM   Overall AROM Comments  WFL for bilateral LE's.      Strength   Overall Strength Comments  Normal bilateral LE strength.      Special Tests    Special Tests  -- (-)Romberg test but requires supervision.      Transfers   Transfers  -- With armrest for sit to stand.      Ambulation/Gait   Gait Comments  The patient walks in some trunk flexion with a straight cane and knees in right knee genu valgum.      Standardized  Balance Assessment    Standardized Balance Assessment  Berg Balance Test      Berg Balance Test   Sit to Stand  Able to stand  independently using hands    Standing Unsupported  Able to stand 2 minutes with supervision    Sitting with Back Unsupported but Feet Supported on Floor or Stool  Able to sit safely and securely 2 minutes    Stand to Sit  Sits safely with minimal use of hands    Transfers  Able to transfer safely, minor use of hands    Standing Unsupported with Eyes Closed  Able to stand 10 seconds with supervision    Standing Ubsupported with Feet Together  Able to place feet together independently and stand for 1 minute with supervision    From Standing, Reach Forward with Outstretched Arm  Can reach confidently >25 cm (10")    From Standing Position, Pick up Object from Floor  Able to pick up shoe, needs supervision    From Standing Position, Turn to Look Behind Over each Shoulder  Looks behind from both sides and weight shifts well    Turn 360 Degrees  Able to turn 360 degrees safely one side only in 4 seconds or less    Standing Unsupported, Alternately Place Feet on Step/Stool  Able to complete 4 steps without aid or supervision    Standing Unsupported, One Foot in Front  Able to take small step independently and hold 30 seconds    Standing on One Leg  Able to lift leg independently and hold equal to or more than 3 seconds    Total Score  44             Objective measurements completed on examination: See above findings.      [place] Adult PT Treatment/Exercise - 03/12/17 0001      Exercises   Exercises  Knee/Hip      Knee/Hip Exercises: Aerobic   Nustep  Level 3 x 10 minutes with 02 sat 94% pre and 98% post.                  PT Long Term Goals - 03/12/17 1322      PT LONG TERM GOAL #1   Title  Evaluation only.             Plan - 03/12/17 1317    Clinical Impression Statement  The patient presents to OPPT for assessment.  Her scored a 44/56 on a Berg balance  test.  He has normal LE strength.  He has had falls but isnow using an assisitve device for safe ambulation.  He states now being on Gabapentin has helped a lot to reduce his LE pain.  He intneds to join a gym with his wife as he admits to not exercising regularly.    History and Personal Factors relevant to plan of care:  Heart surgery; knee replacement.    Clinical Presentation  Stable    Clinical Presentation due to:  Good response to medication.    Clinical Decision Making  Low    Rehab Potential  Excellent    PT Frequency  -- Evaluation only.       Patient will benefit from skilled therapeutic intervention in order to improve the following deficits and impairments:     Visit Diagnosis: Unsteadiness on feet - Plan: PT plan of care cert/re-cert     Problem List Patient Active Problem List   Diagnosis Date Noted  .  Claudication ([place]) 12/01/2016  . 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  . Chronic diastolic congestive heart failure ([place])   . H/O total knee replacement 02/21/2014  . Hypersomnia 10/20/2013  . Edema 04/13/2013  . Essential hypertension, benign 04/12/2013  . Coronary atherosclerosis of native coronary artery   . Pure hypercholesterolemia   . Aortic stenosis   . PNA (pneumonia) 03/21/2013  . Influenza with respiratory manifestations 03/19/2013  . Osteoarthritis of left knee 09/19/2011  . Lateral meniscus tear, current 09/19/2011  . Medial meniscus, posterior horn derangement 09/19/2011    Willetta York, [place] MPT 03/12/2017, 1:25 PM  [facility] [address] [place], [place], [address] Phone: [phone]   Fax:  [phone]  Name: [name] MRN: [id number] Date of Birth: [birth year]

## 2017-03-25 ENCOUNTER — Encounter: Payer: Medicare HMO | Attending: Physical Medicine & Rehabilitation | Admitting: Physical Medicine & Rehabilitation

## 2017-03-25 DIAGNOSIS — Z9889 Other specified postprocedural states: Secondary | ICD-10-CM | POA: Insufficient documentation

## 2017-03-25 DIAGNOSIS — F0390 Unspecified dementia without behavioral disturbance: Secondary | ICD-10-CM | POA: Insufficient documentation

## 2017-03-25 DIAGNOSIS — Z87891 Personal history of nicotine dependence: Secondary | ICD-10-CM | POA: Insufficient documentation

## 2017-03-25 DIAGNOSIS — R296 Repeated falls: Secondary | ICD-10-CM | POA: Insufficient documentation

## 2017-03-25 DIAGNOSIS — M791 Myalgia, unspecified site: Secondary | ICD-10-CM | POA: Insufficient documentation

## 2017-03-25 DIAGNOSIS — M79604 Pain in right leg: Secondary | ICD-10-CM | POA: Insufficient documentation

## 2017-03-25 DIAGNOSIS — I5032 Chronic diastolic (congestive) heart failure: Secondary | ICD-10-CM | POA: Insufficient documentation

## 2017-03-25 DIAGNOSIS — K219 Gastro-esophageal reflux disease without esophagitis: Secondary | ICD-10-CM | POA: Insufficient documentation

## 2017-03-25 DIAGNOSIS — G8929 Other chronic pain: Secondary | ICD-10-CM | POA: Insufficient documentation

## 2017-03-25 DIAGNOSIS — M79605 Pain in left leg: Secondary | ICD-10-CM | POA: Insufficient documentation

## 2017-03-25 DIAGNOSIS — I110 Hypertensive heart disease with heart failure: Secondary | ICD-10-CM | POA: Insufficient documentation

## 2017-03-25 DIAGNOSIS — F419 Anxiety disorder, unspecified: Secondary | ICD-10-CM | POA: Insufficient documentation

## 2017-03-25 DIAGNOSIS — M792 Neuralgia and neuritis, unspecified: Secondary | ICD-10-CM | POA: Insufficient documentation

## 2017-03-25 DIAGNOSIS — I2510 Atherosclerotic heart disease of native coronary artery without angina pectoris: Secondary | ICD-10-CM | POA: Insufficient documentation

## 2017-03-25 DIAGNOSIS — E669 Obesity, unspecified: Secondary | ICD-10-CM | POA: Insufficient documentation

## 2017-03-25 DIAGNOSIS — R531 Weakness: Secondary | ICD-10-CM | POA: Insufficient documentation

## 2017-04-02 DIAGNOSIS — R69 Illness, unspecified: Secondary | ICD-10-CM | POA: Diagnosis not present

## 2017-04-27 ENCOUNTER — Other Ambulatory Visit: Payer: Self-pay | Admitting: Cardiology

## 2017-04-27 MED ORDER — AMLODIPINE BESYLATE 5 MG PO TABS: ORAL_TABLET | ORAL | 6 refills | Status: DC

## 2017-05-01 DIAGNOSIS — F0151 Vascular dementia with behavioral disturbance: Secondary | ICD-10-CM | POA: Diagnosis not present

## 2017-05-01 DIAGNOSIS — I25119 Atherosclerotic heart disease of native coronary artery with unspecified angina pectoris: Secondary | ICD-10-CM | POA: Diagnosis not present

## 2017-05-01 DIAGNOSIS — N3281 Overactive bladder: Secondary | ICD-10-CM | POA: Diagnosis not present

## 2017-05-01 DIAGNOSIS — E7800 Pure hypercholesterolemia, unspecified: Secondary | ICD-10-CM | POA: Diagnosis not present

## 2017-05-01 DIAGNOSIS — M150 Primary generalized (osteo)arthritis: Secondary | ICD-10-CM | POA: Diagnosis not present

## 2017-05-01 DIAGNOSIS — I110 Hypertensive heart disease with heart failure: Secondary | ICD-10-CM | POA: Diagnosis not present

## 2017-05-09 IMAGING — CR DG CHEST 1V PORT
1 series · 1 of 1 positions shown · non-contrast
Comparison: 05/25/2015

CLINICAL DATA: Status post transcatheter valve replacement

EXAM:
PORTABLE CHEST 1 VIEW

[AP]
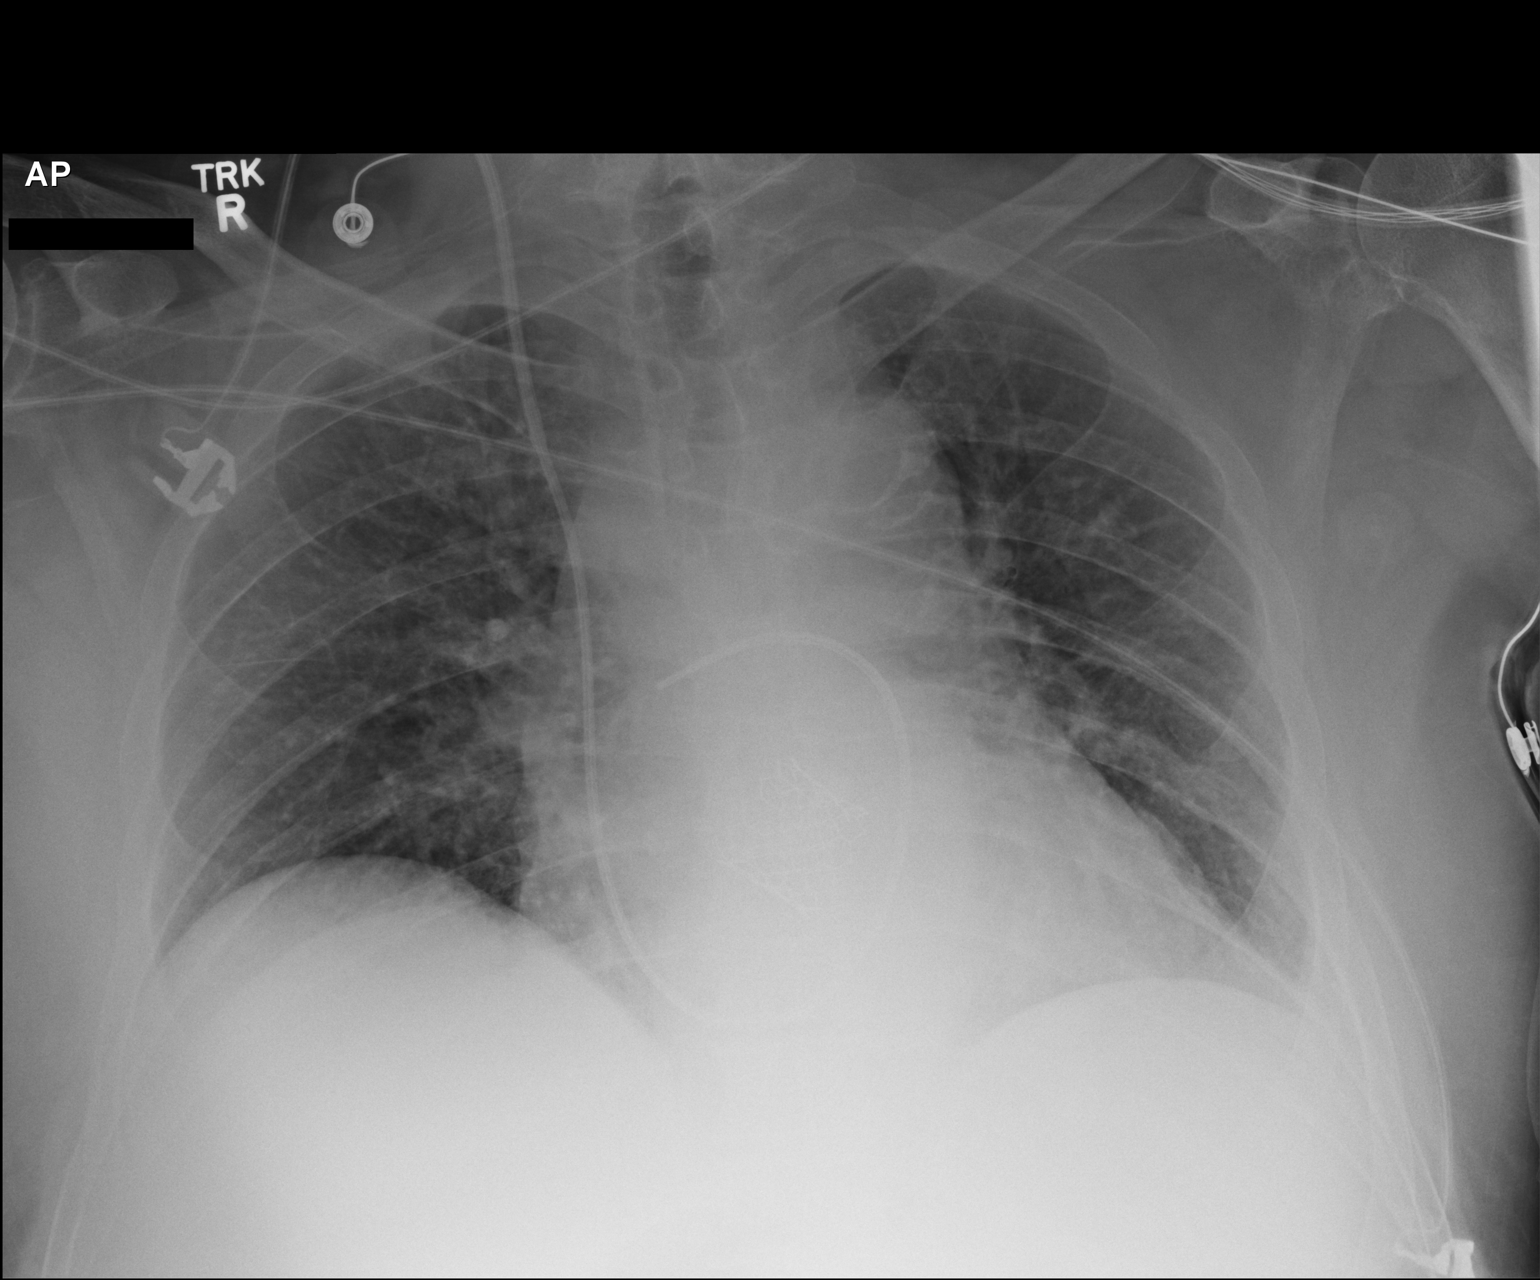

[1 of 1 positions shown; findings below may reference images not displayed]

FINDINGS: Given differences in technique, grossly stable heart size and
mediastinal contours. Swan-Ganz catheter from right IJ approach, tip
at the right main pulmonary artery. Newly seen aortic valve
replacement. Coarsened interstitial markings likely from crowding.
No effusion or air leak.
IMPRESSION: Low volume postoperative chest as described.

## 2017-05-19 DIAGNOSIS — R739 Hyperglycemia, unspecified: Secondary | ICD-10-CM | POA: Diagnosis not present

## 2017-07-28 ENCOUNTER — Other Ambulatory Visit: Payer: Self-pay | Admitting: Cardiology

## 2017-11-06 DIAGNOSIS — H5213 Myopia, bilateral: Secondary | ICD-10-CM | POA: Diagnosis not present

## 2017-11-06 DIAGNOSIS — H524 Presbyopia: Secondary | ICD-10-CM | POA: Diagnosis not present

## 2017-11-17 ENCOUNTER — Other Ambulatory Visit: Payer: Self-pay | Admitting: Cardiology

## 2017-11-17 NOTE — Telephone Encounter (Signed)
Pt's pharmacy is requesting a refill on rosuvastatin, I do not see where this medication was D/C by a provider. Please clarify if pt is supposed to be taking this medication. Please address

## 2017-11-20 DIAGNOSIS — Z0000 Encounter for general adult medical examination without abnormal findings: Secondary | ICD-10-CM | POA: Diagnosis not present

## 2017-11-20 DIAGNOSIS — I110 Hypertensive heart disease with heart failure: Secondary | ICD-10-CM | POA: Diagnosis not present

## 2017-11-20 DIAGNOSIS — F0151 Vascular dementia with behavioral disturbance: Secondary | ICD-10-CM | POA: Diagnosis not present

## 2017-11-20 DIAGNOSIS — K219 Gastro-esophageal reflux disease without esophagitis: Secondary | ICD-10-CM | POA: Diagnosis not present

## 2017-11-20 DIAGNOSIS — I25119 Atherosclerotic heart disease of native coronary artery with unspecified angina pectoris: Secondary | ICD-10-CM | POA: Diagnosis not present

## 2017-11-20 DIAGNOSIS — I5030 Unspecified diastolic (congestive) heart failure: Secondary | ICD-10-CM | POA: Diagnosis not present

## 2017-11-20 DIAGNOSIS — I700 Atherosclerosis of aorta: Secondary | ICD-10-CM | POA: Diagnosis not present

## 2017-11-20 DIAGNOSIS — Z23 Encounter for immunization: Secondary | ICD-10-CM | POA: Diagnosis not present

## 2017-11-20 DIAGNOSIS — E7800 Pure hypercholesterolemia, unspecified: Secondary | ICD-10-CM | POA: Diagnosis not present

## 2017-11-25 ENCOUNTER — Telehealth: Payer: Self-pay

## 2017-11-25 DIAGNOSIS — I10 Essential (primary) hypertension: Secondary | ICD-10-CM

## 2017-11-25 DIAGNOSIS — E7800 Pure hypercholesterolemia, unspecified: Secondary | ICD-10-CM

## 2017-11-25 NOTE — Telephone Encounter (Signed)
Increase Crestor to 20mg  daily and repeat FLp and ALT in 6 weeks

## 2017-11-25 NOTE — Telephone Encounter (Signed)
-----   Message from Sueanne Margarita, MD sent at 11/23/2017  1:49 PM EDT ----- LDL at PCP office is 97 - please increase crestor to 40mg  daily and repeat FLP and ALT in 6 weeks

## 2017-11-25 NOTE — Telephone Encounter (Signed)
Spoke with the patient, he accepted increasing Crestor, however, the patient is currently taking 10 mg daily, sending to Dr. Radford Pax for recommendations.

## 2017-11-26 MED ORDER — ROSUVASTATIN CALCIUM 20 MG PO TABS: 20.0000 mg | ORAL_TABLET | Freq: Every day | ORAL | 3 refills | Status: DC

## 2017-11-26 MED ORDER — ROSUVASTATIN CALCIUM 20 MG PO TABS: 20.0000 mg | ORAL_TABLET | Freq: Every day | ORAL | 0 refills | Status: DC

## 2017-11-26 NOTE — Telephone Encounter (Signed)
Spoke with the patient, they accepted the medication change and sent to two pharmacies to cover the patient till the mail order is received. The patient accepted the fasting lab appointment on 11/25.

## 2018-01-04 ENCOUNTER — Ambulatory Visit
Admission: RE | Admit: 2018-01-04 | Discharge: 2018-01-04 | Disposition: A | Discharge status: Home / Self Care | Payer: Medicare HMO | Source: Ambulatory Visit | Attending: Family Medicine | Admitting: Family Medicine

## 2018-01-04 ENCOUNTER — Other Ambulatory Visit: Payer: Self-pay | Admitting: Family Medicine

## 2018-01-04 ENCOUNTER — Other Ambulatory Visit: Payer: Medicare HMO

## 2018-01-04 DIAGNOSIS — E7800 Pure hypercholesterolemia, unspecified: Secondary | ICD-10-CM | POA: Diagnosis not present

## 2018-01-04 DIAGNOSIS — R059 Cough, unspecified: Secondary | ICD-10-CM

## 2018-01-04 DIAGNOSIS — R05 Cough: Secondary | ICD-10-CM

## 2018-01-04 DIAGNOSIS — J209 Acute bronchitis, unspecified: Secondary | ICD-10-CM | POA: Diagnosis not present

## 2018-01-05 ENCOUNTER — Other Ambulatory Visit: Payer: Self-pay | Admitting: *Deleted

## 2018-01-05 MED ORDER — AMLODIPINE BESYLATE 5 MG PO TABS: ORAL_TABLET | ORAL | 1 refills | Status: DC

## 2018-01-27 ENCOUNTER — Other Ambulatory Visit: Payer: Self-pay | Admitting: Cardiology

## 2018-01-27 NOTE — Telephone Encounter (Signed)
Outpatient Medication Detail    Disp Refills Start End   amLODipine (NORVASC) 5 MG tablet 30 tablet 1 01/05/2018    Sig: TAKE 1 TABLET (5 MG TOTAL) BY MOUTH DAILY.   Sent to pharmacy as: amLODipine (NORVASC) 5 MG tablet   E-Prescribing Status: Receipt confirmed by pharmacy (01/05/2018 9:40 AM EST)   Pharmacy   [id number] - [place], [place]

## 2018-02-15 NOTE — Progress Notes (Signed)
Cardiology Office Note:    Date:  02/16/2018   ID:  ARISTIDE WAGGLE, DOB 03/22/1933, MRN [id number]  PCP:  [name], MD  Cardiologist:  Fransico Him, MD    Referring MD: [name], MD   Chief Complaint  Patient presents with  . Aortic Stenosis  . Hyperlipidemia  . Hypertension    History of Present Illness:    [name] is a 85 y.o. male with a hx of severe aortic valve stenosis who is s/p TAVR on 05/29/15 with 29 mm Edwards Sapien 3 bioprosthetic valve. He also has a history of ASCAD s/p PCI of left cir and LAD 2005 and PCI of prox PDA 7/09.  He has HTN, hyperlipidemia and chronic diastolic CHF.  He is here today for followup and is doing well.  He denies any chest pain or pressure, SOB, DOE, PND, orthopnea, LE edema, dizziness, palpitations or syncope. He is compliant with his meds and is tolerating meds with no SE.    Past Medical History:  Diagnosis Date  . Allergic rhinitis   . Anxiety   . Aortic insufficiency   . Aortic stenosis, severe    S/P TAVR  . Arthritis   . BPH (benign prostatic hypertrophy)   . Bruises easily   . Chronic diastolic congestive heart failure ([place])   . Chronic fatigue   . Coronary artery disease    s/p PCI of left cir and LAD 2005 and PCI of prox PDA 7/09  . Dementia ([place])    early per pt- recently evaluated at [facility]- eccho7/13  , MRI head EPIC 6/13  . Dementia, vascular ([place])    Dr [name]  . Essential hypertension, benign   . GERD (gastroesophageal reflux disease)   . HBP (high blood pressure)   . Headache(784.0)    relieved with OTC's  . Hyperlipidemia   . Hypertension   . Incontinence of urine   . Left carotid bruit    w minimal obstruction on doppler  . OAB (overactive bladder)   . Osteoarthritis of left knee   . Pure hypercholesterolemia   . 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  Unsteady gait     Past Surgical History:  Procedure Laterality Date  . CARDIAC CATHETERIZATION     3 stents first cath 2 stents placed then additional stent placed  . CARDIAC CATHETERIZATION N/A 04/19/2015   Procedure: Right/Left Heart Cath and Coronary Angiography;  Surgeon: [name], MD;  Location: [place] CV [facility];  Service: Cardiovascular;  Laterality: N/A;  . COLONOSCOPY W/ POLYPECTOMY    . colonscopy     . EYE SURGERY Bilateral    cataract surgery bilat   . HEMORROIDECTOMY    . KNEE ARTHROSCOPY  09/19/2011   Procedure: ARTHROSCOPY KNEE;  Surgeon: [name], MD;  [facility];  Service: Orthopedics;  Laterality: Left;  . ROTATOR CUFF REPAIR Right 3/13   right  . stent placed     x3 per pt  . TEE WITHOUT CARDIOVERSION N/A 05/29/2015   Procedure: TRANSESOPHAGEAL ECHOCARDIOGRAM (TEE);  Surgeon: [name], MD;  Location: [place];  Service: Open Heart Surgery;  Laterality: N/A;  . TONSILLECTOMY    . TOTAL KNEE ARTHROPLASTY Left 02/21/2014   Procedure: LEFT TOTAL KNEE ARTHROPLASTY;  Surgeon: [name], MD;  [facility];  Service: Orthopedics;  Laterality: Left;  .  TRANSCATHETER AORTIC VALVE REPLACEMENT, TRANSFEMORAL N/A 05/29/2015   Procedure: TRANSCATHETER AORTIC VALVE REPLACEMENT, TRANSFEMORAL;  Surgeon: [name], MD;  Location: [place];  Service: Open Heart Surgery;  Laterality: N/A;  . TRANSURETHRAL RESECTION OF PROSTATE    . TURP VAPORIZATION      Current Medications: Current Meds  Medication Sig  . acetaminophen (TYLENOL) 325 MG tablet Take 650 mg by mouth every 6 (six) hours as needed for mild pain.  [name] Kitchen amLODipine (NORVASC) 5 MG tablet TAKE 1 TABLET (5 MG TOTAL) BY MOUTH DAILY.  [name] Kitchen aspirin 81 MG tablet Take 81 mg by mouth daily.  . carvedilol (COREG) 12.5 MG tablet TAKE 1 TABLET TWICE DAILY WITH A MEAL  . chlorhexidine (PERIDEX) 0.12 % solution Rinse with 15 mls twice daily for 30 seconds. Use after breakfast and at bedtime.  Spit out excess. Do not swallow.  . clopidogrel (PLAVIX) 75 MG tablet Take 1 tablet (75 mg total) by mouth daily.  . diclofenac sodium (VOLTAREN) 1 % GEL Apply 2 g topically 4 (four) times daily.  . finasteride (PROSCAR) 5 MG tablet Take 5 mg by mouth daily.  . furosemide (LASIX) 20 MG tablet TAKE 1 TABLET EVERY DAY  . gabapentin (NEURONTIN) 300 MG capsule Take 1 tablet at bedtime for one week, then increase to 1 tablet twice daily.  [name] Kitchen losartan (COZAAR) 50 MG tablet Take 1 tablet (50 mg total) by mouth daily. Please make overdue appt with Dr. Radford Pax before anymore refills. 1st attempt  . memantine (NAMENDA) 10 MG tablet Take 10 mg by mouth daily.  . montelukast (SINGULAIR) 10 MG tablet Take 10 mg by mouth daily.  . Multiple Vitamin (MULTIVITAMIN WITH MINERALS) TABS Take 1 tablet by mouth daily.  . nitroGLYCERIN (NITROSTAT) 0.4 MG SL tablet Take 0.4 mg by mouth as needed. Place one tablet under your tongue every 5 minutes as needed for chest pain  . omeprazole (PRILOSEC) 40 MG capsule Take 1 capsule (40 mg total) by mouth daily. Please make overdue yearly appt with Dr. Radford Pax before anymore refills. 1st attempt  . potassium chloride SA (K-DUR,KLOR-CON) 20 MEQ tablet Take 1 tablet (20 mEq total) by mouth daily. Please make overdue yearly appt with Dr. Radford Pax before anymore refills. 1st attempt  . rosuvastatin (CRESTOR) 20 MG tablet Take 1 tablet (20 mg total) by mouth daily.     Allergies:   Latex; Sulfa antibiotics; and Sulfasalazine   Social History   Socioeconomic History  . Marital status: Married    Spouse name: Not on file  . Number of children: 3  . Years of education: Not on file  . Highest education level: Not on file  Occupational History  . Occupation: retired  Scientific laboratory technician  . Financial resource strain: Not on file  . Food insecurity:    Worry: Not on file    Inability: Not on file  . Transportation needs:    Medical: Not on file    Non-medical: Not on file  Tobacco Use    . Smoking status: Former Smoker    Years: 20.00    Types: Cigars    Last attempt to quit: 02/10/1970    Years since quitting: 48.0  . Smokeless tobacco: Former Systems developer    Types: Chew  . Tobacco comment: smoked cigars off and on x 20 years  Substance and Sexual Activity  . Alcohol use: Yes    Alcohol/week: 3.0 standard drinks    Types: 3 Glasses of wine per week  Comment: weekly/    occ beer/wine not recently (05/25/15)  . Drug use: No  . Sexual activity: Not Currently  Lifestyle  . Physical activity:    Days per week: Not on file    Minutes per session: Not on file  . Stress: Not on file  Relationships  . Social connections:    Talks on phone: Not on file    Gets together: Not on file    Attends religious service: Not on file    Active member of club or organization: Not on file    Attends meetings of clubs or organizations: Not on file    Relationship status: Not on file  Other Topics Concern  . Not on file  Social History Narrative  . Not on file     Family History: The patient's family history includes Lung cancer in his father. There is no history of Anesthesia problems or Heart attack.  ROS:   Please see the history of present illness.    ROS  All other systems reviewed and negative.   EKGs/Labs/Other Studies Reviewed:    The following studies were reviewed today: none  EKG:  EKG is  ordered today.  The ekg ordered today demonstrates NSR at 63bpm with no ST changes  Recent Labs: No results found for requested labs within last 8760 hours.   Recent Lipid Panel    Component Value Date/Time   CHOL 141 10/20/2014 1111   TRIG 126.0 10/20/2014 1111   HDL 47.10 10/20/2014 1111   CHOLHDL 3 10/20/2014 1111   VLDL 25.2 10/20/2014 1111   LDLCALC 69 10/20/2014 1111    Physical Exam:    VS:  BP (!) 142/88   Pulse 63   Ht 6\' 3"  (1.905 m)   Wt 265 lb 6.4 oz (120.4 kg)   BMI 33.17 kg/m     Wt Readings from Last 3 Encounters:  02/16/18 265 lb 6.4 oz (120.4  kg)  01/29/17 258 lb (117 kg)  01/09/17 252 lb (114.3 kg)     GEN:  Well nourished, well developed in no acute distress HEENT: Normal NECK: No JVD; No carotid bruits LYMPHATICS: No lymphadenopathy CARDIAC: RRR, no murmurs, rubs, gallops RESPIRATORY:  Clear to auscultation without rales, wheezing or rhonchi  ABDOMEN: Soft, non-tender, non-distended MUSCULOSKELETAL:  No edema; No deformity  SKIN: Warm and dry NEUROLOGIC:  Alert and oriented x 3 PSYCHIATRIC:  Normal affect   ASSESSMENT:    1. Atherosclerosis of native coronary artery of native heart without angina pectoris   2. Nonrheumatic aortic valve stenosis   3. Essential hypertension, benign   4. Chronic diastolic congestive heart failure ([place])   5. Pure hypercholesterolemia    PLAN:    In order of problems listed above:  1.  ASCAD - s/p PCI of left cir and LAD 2005 and PCI of prox PDA 7/09.  He denies any anginal sx.  He will continue on ASA 81mg  daily, Plavix 75mg  daily, statin and Carvedilol 12.5mg  BID.   2.  Aortic Stenosis - s/p TAVR on 05/29/15 with 29 mm Edwards Sapien 3 biopro sthetic valve.  Echo post TAVR showed normal LVF with with mean AVG 68mmHg and no AI.  I will repeat echo to make sure this is stable.   3.  HTN - BP is well controlled on exam today.  He will continue on Losartan 50mg  daily, Carvedilol 12.5mg  BID and amlodipine 5mg  daily.  Creatinine was stable at 1 on 11/2017  4.  Chronic diastolic  CHF - he appears euvolemic on exam today.  He will continue on Lasxi 20mg  daily.   5.  Hyperlipidemia - LDL goal is < 70.  He will continue on Crestor 20mg  daily.His LDL ws 52 on 12/2017.     Medication Adjustments/Labs and Tests Ordered: Current medicines are reviewed at length with the patient today.  Concerns regarding medicines are outlined above.  Orders Placed This Encounter  Procedures  . EKG 12-Lead   No orders of the defined types were placed in this encounter.   Signed, Fransico Him, MD   02/16/2018 9:26 AM    [place]

## 2018-02-16 ENCOUNTER — Encounter: Payer: Self-pay | Admitting: Cardiology

## 2018-02-16 ENCOUNTER — Ambulatory Visit: Payer: Medicare HMO | Admitting: Cardiology

## 2018-02-16 VITALS — BP 142/88 | HR 63 | Ht 75.0 in | Wt 265.4 lb

## 2018-02-16 DIAGNOSIS — I2510 Atherosclerotic heart disease of native coronary artery without angina pectoris: Secondary | ICD-10-CM

## 2018-02-16 DIAGNOSIS — I10 Essential (primary) hypertension: Secondary | ICD-10-CM

## 2018-02-16 DIAGNOSIS — E7800 Pure hypercholesterolemia, unspecified: Secondary | ICD-10-CM | POA: Diagnosis not present

## 2018-02-16 DIAGNOSIS — I5032 Chronic diastolic (congestive) heart failure: Secondary | ICD-10-CM

## 2018-02-16 DIAGNOSIS — I350 Nonrheumatic aortic (valve) stenosis: Secondary | ICD-10-CM | POA: Diagnosis not present

## 2018-02-16 NOTE — Patient Instructions (Signed)
Medication Instructions:  Your physician recommends that you continue on your current medications as directed. Please refer to the Current Medication list given to you today.  If you need a refill on your cardiac medications before your next appointment, please call your pharmacy.   Lab work:  If you have labs (blood work) drawn today and your tests are completely normal, you will receive your results only by: [name] Kitchen MyChart Message (if you have MyChart) OR . A paper copy in the mail If you have any lab test that is abnormal or we need to change your treatment, we will call you to review the results.  Testing/Procedures: Your physician has requested that you have an echocardiogram. Echocardiography is a painless test that uses sound waves to create images of your heart. It provides your doctor with information about the size and shape of your heart and how well your heart's chambers and valves are working. This procedure takes approximately one hour. There are no restrictions for this procedure.  Follow-Up: At [facility], you and your health needs are our priority.  As part of our continuing mission to provide you with exceptional heart care, we have created designated Provider Care Teams.  These Care Teams include your primary Cardiologist (physician) and Advanced Practice Providers (APPs -  Physician Assistants and Nurse Practitioners) who all work together to provide you with the care you need, when you need it. You will need a follow up appointment in 1 years.  Please call our office 2 months in advance to schedule this appointment.  You may see Fransico Him, MD or one of the following Advanced Practice Providers on your designated Care Team:   [place], PA-C Melina Copa, PA-C . Ermalinda Barrios, PA-C

## 2018-02-18 ENCOUNTER — Other Ambulatory Visit: Payer: Self-pay | Admitting: Cardiology

## 2018-02-22 ENCOUNTER — Ambulatory Visit (HOSPITAL_COMMUNITY): Payer: Medicare HMO | Attending: Cardiovascular Disease

## 2018-02-22 ENCOUNTER — Other Ambulatory Visit: Payer: Self-pay

## 2018-02-22 DIAGNOSIS — I350 Nonrheumatic aortic (valve) stenosis: Secondary | ICD-10-CM | POA: Diagnosis not present

## 2018-03-06 ENCOUNTER — Other Ambulatory Visit: Payer: Self-pay | Admitting: Cardiology

## 2018-04-05 DIAGNOSIS — J45909 Unspecified asthma, uncomplicated: Secondary | ICD-10-CM | POA: Diagnosis not present

## 2018-04-05 DIAGNOSIS — K219 Gastro-esophageal reflux disease without esophagitis: Secondary | ICD-10-CM | POA: Diagnosis not present

## 2018-04-05 DIAGNOSIS — J069 Acute upper respiratory infection, unspecified: Secondary | ICD-10-CM | POA: Diagnosis not present

## 2018-05-20 ENCOUNTER — Other Ambulatory Visit: Payer: Self-pay | Admitting: Cardiology

## 2018-05-20 MED ORDER — POTASSIUM CHLORIDE CRYS ER 20 MEQ PO TBCR: 20.0000 meq | EXTENDED_RELEASE_TABLET | Freq: Every day | ORAL | 2 refills | Status: DC

## 2018-06-09 DIAGNOSIS — J45909 Unspecified asthma, uncomplicated: Secondary | ICD-10-CM | POA: Diagnosis not present

## 2018-06-09 DIAGNOSIS — F0151 Vascular dementia with behavioral disturbance: Secondary | ICD-10-CM | POA: Diagnosis not present

## 2018-06-09 DIAGNOSIS — M79606 Pain in leg, unspecified: Secondary | ICD-10-CM | POA: Diagnosis not present

## 2018-06-09 DIAGNOSIS — E7800 Pure hypercholesterolemia, unspecified: Secondary | ICD-10-CM | POA: Diagnosis not present

## 2018-06-09 DIAGNOSIS — I25119 Atherosclerotic heart disease of native coronary artery with unspecified angina pectoris: Secondary | ICD-10-CM | POA: Diagnosis not present

## 2018-06-09 DIAGNOSIS — I110 Hypertensive heart disease with heart failure: Secondary | ICD-10-CM | POA: Diagnosis not present

## 2018-06-09 DIAGNOSIS — Z6834 Body mass index (BMI) 34.0-34.9, adult: Secondary | ICD-10-CM | POA: Diagnosis not present

## 2018-06-09 DIAGNOSIS — J301 Allergic rhinitis due to pollen: Secondary | ICD-10-CM | POA: Diagnosis not present

## 2018-06-09 DIAGNOSIS — F0150 Vascular dementia without behavioral disturbance: Secondary | ICD-10-CM | POA: Diagnosis not present

## 2018-06-09 DIAGNOSIS — I700 Atherosclerosis of aorta: Secondary | ICD-10-CM | POA: Diagnosis not present

## 2018-06-22 ENCOUNTER — Other Ambulatory Visit: Payer: Self-pay | Admitting: Cardiology

## 2018-06-22 MED ORDER — POTASSIUM CHLORIDE CRYS ER 20 MEQ PO TBCR: 20.0000 meq | EXTENDED_RELEASE_TABLET | Freq: Every day | ORAL | 0 refills | Status: DC

## 2018-06-22 NOTE — Telephone Encounter (Signed)
Pt's wife calling requesting a 15 day supply until mail order pharmacy sent out medication. Rx sent in to local pharmacy. Confirmation received.

## 2018-06-26 ENCOUNTER — Other Ambulatory Visit: Payer: Self-pay | Admitting: Cardiology

## 2018-06-30 ENCOUNTER — Other Ambulatory Visit: Payer: Self-pay | Admitting: Cardiology

## 2018-07-01 DIAGNOSIS — I110 Hypertensive heart disease with heart failure: Secondary | ICD-10-CM | POA: Diagnosis not present

## 2018-07-01 DIAGNOSIS — Z20828 Contact with and (suspected) exposure to other viral communicable diseases: Secondary | ICD-10-CM | POA: Diagnosis not present

## 2018-07-01 DIAGNOSIS — M545 Low back pain: Secondary | ICD-10-CM | POA: Diagnosis not present

## 2018-07-07 ENCOUNTER — Other Ambulatory Visit: Payer: Self-pay | Admitting: Cardiology

## 2018-07-07 MED ORDER — POTASSIUM CHLORIDE CRYS ER 20 MEQ PO TBCR: 20.0000 meq | EXTENDED_RELEASE_TABLET | Freq: Every day | ORAL | 0 refills | Status: DC

## 2018-07-07 NOTE — Telephone Encounter (Signed)
Pt needed a 15 day supply until mail order arrives on 07/18/18. Medication sent to pt's pharmacy as requested. Confirmation received.

## 2018-07-12 ENCOUNTER — Other Ambulatory Visit: Payer: Self-pay | Admitting: Family Medicine

## 2018-07-12 ENCOUNTER — Ambulatory Visit
Admission: RE | Admit: 2018-07-12 | Discharge: 2018-07-12 | Disposition: A | Discharge status: Home / Self Care | Payer: Medicare HMO | Source: Ambulatory Visit | Attending: Family Medicine | Admitting: Family Medicine

## 2018-07-12 ENCOUNTER — Other Ambulatory Visit: Payer: Self-pay

## 2018-07-12 DIAGNOSIS — M549 Dorsalgia, unspecified: Secondary | ICD-10-CM | POA: Diagnosis not present

## 2018-07-12 DIAGNOSIS — M5440 Lumbago with sciatica, unspecified side: Secondary | ICD-10-CM

## 2018-07-14 DIAGNOSIS — M5136 Other intervertebral disc degeneration, lumbar region: Secondary | ICD-10-CM | POA: Diagnosis not present

## 2018-07-21 ENCOUNTER — Other Ambulatory Visit: Payer: Self-pay | Admitting: Physical Medicine and Rehabilitation

## 2018-07-21 DIAGNOSIS — M5136 Other intervertebral disc degeneration, lumbar region: Secondary | ICD-10-CM

## 2018-07-26 ENCOUNTER — Other Ambulatory Visit: Payer: Self-pay | Admitting: Cardiology

## 2018-07-27 ENCOUNTER — Telehealth: Payer: Self-pay

## 2018-07-27 NOTE — Telephone Encounter (Signed)
   Primary Cardiologist: Fransico Him, MD  Chart reviewed as part of pre-operative protocol coverage.  Dr. Radford Pax, can patient hold Plavix for 5 days?  [place], [place] 07/27/2018, 2:43 PM

## 2018-07-27 NOTE — Telephone Encounter (Signed)
   [facility] Pre-operative Risk Assessment    Request for surgical clearance:  1. What type of surgery is being performed? Nerve Root Block   2. When is this surgery scheduled? TBD   3. What type of clearance is required (medical clearance vs. Pharmacy clearance to hold med vs. Both)? Pharmacy  4. Are there any medications that need to be held prior to surgery and how long? Plavix for 5 days   5. Practice name and name of physician performing surgery? [facility]    6. What is your office phone number [phone]    7.   What is your office fax number [phone]  8.   Anesthesia type (None, local, MAC, general) ? None listed   [name] 07/27/2018, 1:34 PM  _________________________________________________________________   (provider comments below)

## 2018-07-27 NOTE — Telephone Encounter (Signed)
Yes ok to hold Plavix

## 2018-07-29 NOTE — Telephone Encounter (Signed)
   Primary Cardiologist: Fransico Him, MD  Chart reviewed as part of pre-operative protocol coverage. Given past medical history and time since last visit, based on ACC/AHA guidelines, Kino R Earnest would be at acceptable risk for the planned procedure without further cardiovascular testing.  Patient had a recent echocardiogram 02/2018 to assess valve prosthesis which was noted to be stable.  He denied angina, SOB, LE swelling, palpitations dizziness or syncope at last office.   Per Dr. Theodosia Blender recommendations, patient is okay to hold Plavix 5 days prior to procedure and resume ASAP once okay per requesting physician.  I will route this recommendation to the requesting party via Epic fax function and remove from pre-op pool.  Please call with questions.  Kathyrn Drown, NP 07/29/2018, 1:17 PM

## 2018-08-16 ENCOUNTER — Ambulatory Visit
Admission: RE | Admit: 2018-08-16 | Discharge: 2018-08-16 | Disposition: A | Discharge status: Home / Self Care | Payer: Medicare HMO | Source: Ambulatory Visit | Attending: Physical Medicine and Rehabilitation | Admitting: Physical Medicine and Rehabilitation

## 2018-08-16 DIAGNOSIS — M545 Low back pain: Secondary | ICD-10-CM | POA: Diagnosis not present

## 2018-08-16 DIAGNOSIS — M5136 Other intervertebral disc degeneration, lumbar region: Secondary | ICD-10-CM

## 2018-08-16 DIAGNOSIS — G8929 Other chronic pain: Secondary | ICD-10-CM | POA: Diagnosis not present

## 2018-08-16 MED ORDER — IOPAMIDOL (ISOVUE-M 200) INJECTION 41%
1.0000 mL | Freq: Once | INTRAMUSCULAR | Status: AC
  Administered 2018-08-16: 1 mL via EPIDURAL

## 2018-08-16 MED ORDER — METHYLPREDNISOLONE ACETATE 40 MG/ML INJ SUSP (RADIOLOG
120.0000 mg | Freq: Once | INTRAMUSCULAR | Status: AC
  Administered 2018-08-16: 10:00:00 120 mg via EPIDURAL

## 2018-08-16 NOTE — Discharge Instructions (Signed)

## 2018-08-22 ENCOUNTER — Other Ambulatory Visit: Payer: Self-pay | Admitting: Cardiology

## 2018-11-02 ENCOUNTER — Other Ambulatory Visit: Payer: Self-pay | Admitting: Cardiology

## 2018-12-02 DIAGNOSIS — I110 Hypertensive heart disease with heart failure: Secondary | ICD-10-CM | POA: Diagnosis not present

## 2018-12-02 DIAGNOSIS — E7800 Pure hypercholesterolemia, unspecified: Secondary | ICD-10-CM | POA: Diagnosis not present

## 2018-12-06 DIAGNOSIS — I700 Atherosclerosis of aorta: Secondary | ICD-10-CM | POA: Diagnosis not present

## 2018-12-06 DIAGNOSIS — F0150 Vascular dementia without behavioral disturbance: Secondary | ICD-10-CM | POA: Diagnosis not present

## 2018-12-06 DIAGNOSIS — Z0000 Encounter for general adult medical examination without abnormal findings: Secondary | ICD-10-CM | POA: Diagnosis not present

## 2018-12-06 DIAGNOSIS — K219 Gastro-esophageal reflux disease without esophagitis: Secondary | ICD-10-CM | POA: Diagnosis not present

## 2018-12-06 DIAGNOSIS — E7800 Pure hypercholesterolemia, unspecified: Secondary | ICD-10-CM | POA: Diagnosis not present

## 2018-12-06 DIAGNOSIS — I25119 Atherosclerotic heart disease of native coronary artery with unspecified angina pectoris: Secondary | ICD-10-CM | POA: Diagnosis not present

## 2018-12-06 DIAGNOSIS — N3281 Overactive bladder: Secondary | ICD-10-CM | POA: Diagnosis not present

## 2018-12-06 DIAGNOSIS — I5030 Unspecified diastolic (congestive) heart failure: Secondary | ICD-10-CM | POA: Diagnosis not present

## 2018-12-06 DIAGNOSIS — I110 Hypertensive heart disease with heart failure: Secondary | ICD-10-CM | POA: Diagnosis not present

## 2018-12-06 DIAGNOSIS — F0151 Vascular dementia with behavioral disturbance: Secondary | ICD-10-CM | POA: Diagnosis not present

## 2018-12-24 ENCOUNTER — Other Ambulatory Visit: Payer: Self-pay

## 2018-12-24 ENCOUNTER — Telehealth: Payer: Self-pay | Admitting: Cardiology

## 2018-12-24 NOTE — Telephone Encounter (Signed)
I spoke to the patient's wife Joycelyn Schmid) who said that the patient has not taken Losartan in over 1 year.  They have been monitoring his BP and it has been "great."

## 2018-12-24 NOTE — Telephone Encounter (Signed)
New Message  Neoma Laming from [facility] is calling to make Dr. Radford Pax aware that patient is not taking Losartan. [phone] is the call back number for [facility] with [facility].

## 2019-02-23 ENCOUNTER — Ambulatory Visit: Payer: Medicare Other | Attending: Internal Medicine

## 2019-02-23 DIAGNOSIS — Z23 Encounter for immunization: Secondary | ICD-10-CM

## 2019-02-23 NOTE — Progress Notes (Signed)
   Covid-19 Vaccination Clinic  Name:  [name]    MRN: [id number] DOB: 1933/11/22  02/23/2019  [name] was observed post Covid-19 immunization for 30 minutes based on pre-vaccination screening without incidence. He was provided with Vaccine Information Sheet and instruction to access the V-Safe system.   [name] was instructed to call 911 with any severe reactions post vaccine: [name] Kitchen Difficulty breathing  . Swelling of your face and throat  . A fast heartbeat  . A bad rash all over your body  . Dizziness and weakness    Immunizations Administered    Name Date Dose VIS Date Route   Pfizer COVID-19 Vaccine 02/23/2019 10:49 AM 0.3 mL 01/21/2019 Intramuscular   Manufacturer: [employer]   Lot: [id number]   [place]: [id number]

## 2019-03-15 ENCOUNTER — Ambulatory Visit: Payer: Medicare HMO | Attending: Internal Medicine

## 2019-03-16 ENCOUNTER — Ambulatory Visit: Payer: Medicare HMO | Attending: Internal Medicine

## 2019-03-16 DIAGNOSIS — Z23 Encounter for immunization: Secondary | ICD-10-CM

## 2019-03-16 NOTE — Progress Notes (Signed)
   Covid-19 Vaccination Clinic  Name:  [name]    MRN: [id number] DOB: 02-20-1933  03/16/2019  [name] was observed post Covid-19 immunization for 30 minutes based on pre-vaccination screening without incidence. He was provided with Vaccine Information Sheet and instruction to access the V-Safe system.   [name] was instructed to call 911 with any severe reactions post vaccine: [name] Kitchen Difficulty breathing  . Swelling of your face and throat  . A fast heartbeat  . A bad rash all over your body  . Dizziness and weakness    Immunizations Administered    Name Date Dose VIS Date Route   Pfizer COVID-19 Vaccine 03/16/2019  9:37 AM 0.3 mL 01/21/2019 Intramuscular   Manufacturer: [place]   Lot: [id number]   [place]: [id number]

## 2019-04-21 ENCOUNTER — Other Ambulatory Visit: Payer: Self-pay | Admitting: Cardiology

## 2019-05-02 ENCOUNTER — Other Ambulatory Visit: Payer: Self-pay | Admitting: Cardiology

## 2019-05-19 ENCOUNTER — Other Ambulatory Visit: Payer: Self-pay | Admitting: Cardiology

## 2019-05-24 ENCOUNTER — Other Ambulatory Visit: Payer: Self-pay | Admitting: Cardiology

## 2019-06-06 ENCOUNTER — Other Ambulatory Visit: Payer: Self-pay | Admitting: Family Medicine

## 2019-06-06 ENCOUNTER — Other Ambulatory Visit: Payer: Self-pay | Admitting: Cardiology

## 2019-06-06 ENCOUNTER — Ambulatory Visit
Admission: RE | Admit: 2019-06-06 | Discharge: 2019-06-06 | Disposition: A | Discharge status: Home / Self Care | Payer: Medicare HMO | Source: Ambulatory Visit | Attending: Family Medicine | Admitting: Family Medicine

## 2019-06-06 DIAGNOSIS — I110 Hypertensive heart disease with heart failure: Secondary | ICD-10-CM | POA: Diagnosis not present

## 2019-06-06 DIAGNOSIS — R300 Dysuria: Secondary | ICD-10-CM | POA: Diagnosis not present

## 2019-06-06 DIAGNOSIS — W19XXXA Unspecified fall, initial encounter: Secondary | ICD-10-CM

## 2019-06-06 DIAGNOSIS — E7800 Pure hypercholesterolemia, unspecified: Secondary | ICD-10-CM | POA: Diagnosis not present

## 2019-06-06 DIAGNOSIS — F0151 Vascular dementia with behavioral disturbance: Secondary | ICD-10-CM | POA: Diagnosis not present

## 2019-06-06 DIAGNOSIS — M545 Low back pain: Secondary | ICD-10-CM | POA: Diagnosis not present

## 2019-06-06 DIAGNOSIS — M549 Dorsalgia, unspecified: Secondary | ICD-10-CM | POA: Diagnosis not present

## 2019-06-08 DIAGNOSIS — R300 Dysuria: Secondary | ICD-10-CM | POA: Diagnosis not present

## 2019-06-08 DIAGNOSIS — R8290 Unspecified abnormal findings in urine: Secondary | ICD-10-CM | POA: Diagnosis not present

## 2019-06-17 ENCOUNTER — Other Ambulatory Visit: Payer: Self-pay | Admitting: Cardiology

## 2019-06-19 ENCOUNTER — Other Ambulatory Visit: Payer: Self-pay | Admitting: Cardiology

## 2019-06-27 NOTE — Progress Notes (Signed)
Cardiology Office Note:    Date:  06/28/2019   ID:  [name], DOB Oct 09, 1933, MRN [id number]  PCP:  Martin Neer, MD  Cardiologist:  Martin Him, MD    Referring MD: Martin Neer, MD   Chief Complaint  Patient presents with  . Aortic Stenosis  . Coronary Artery Disease  . Hypertension  . Hyperlipidemia    History of Present Illness:    [name] is a 86 y.o. male with a hx of severe aortic valve stenosis who is s/p TAVR on 05/29/15 with 29 mm Edwards Sapien 3 bioprosthetic valve. He also has a history of ASCAD s/p PCI of left cir and LAD 2005 and PCI of prox PDA 7/09.  He has HTN, hyperlipidemia and chronic diastolic CHF.    He is here today for followup and is doing well.  He denies any chest pain or pressure, SOB, DOE, PND, orthopnea, LE edema, dizziness, palpitations or syncope. He is compliant with his meds and is tolerating meds with no SE.    Past Medical History:  Diagnosis Date  . Allergic rhinitis   . Anxiety   . Aortic insufficiency   . Aortic stenosis, severe    S/P TAVR  . Arthritis   . BPH (benign prostatic hypertrophy)   . Bruises easily   . Chronic diastolic congestive heart failure ([place])   . Chronic fatigue   . Coronary artery disease    s/p PCI of left cir and LAD 2005 and PCI of prox PDA 7/09  . Dementia ([place])    early per pt- recently evaluated at [facility]- eccho7/13  , MRI head EPIC 6/13  . Dementia, vascular ([place])    Dr [name]  . Essential hypertension, benign   . GERD (gastroesophageal reflux disease)   . HBP (high blood pressure)   . Headache(784.0)    relieved with OTC's  . Hyperlipidemia   . Hypertension   . Incontinence of urine   . Left carotid bruit    w minimal obstruction on doppler  . OAB (overactive bladder)   . Osteoarthritis of left knee   . Pure hypercholesterolemia   . 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 Unsteady gait     Past Surgical History:  Procedure Laterality Date  . CARDIAC CATHETERIZATION     3 stents first cath 2 stents placed then additional stent placed  . CARDIAC CATHETERIZATION N/A 04/19/2015   Procedure: Right/Left Heart Cath and Coronary Angiography;  Surgeon: Martin Sine, MD;  Location: [facility];  Service: Cardiovascular;  Laterality: N/A;  . COLONOSCOPY W/ POLYPECTOMY    . colonscopy     . EYE SURGERY Bilateral    cataract surgery bilat   . HEMORROIDECTOMY    . KNEE ARTHROSCOPY  09/19/2011   Procedure: ARTHROSCOPY KNEE;  Surgeon: [name], MD;  Location: [facility];  Service: Orthopedics;  Laterality: Left;  . ROTATOR CUFF REPAIR Right 3/13   right  . stent placed     x3 per pt  . TEE WITHOUT CARDIOVERSION N/A 05/29/2015   Procedure: TRANSESOPHAGEAL ECHOCARDIOGRAM (TEE);  Surgeon: [name], MD;  Location: [facility];  Service: Open Heart Surgery;  Laterality: N/A;  . TONSILLECTOMY    . TOTAL KNEE ARTHROPLASTY Left 02/21/2014   Procedure: LEFT TOTAL KNEE ARTHROPLASTY;  Surgeon: [name], MD;  Location: [facility];  Service: Orthopedics;  Laterality: Left;  . TRANSCATHETER AORTIC VALVE REPLACEMENT, TRANSFEMORAL N/A 05/29/2015   Procedure: TRANSCATHETER AORTIC VALVE REPLACEMENT, TRANSFEMORAL;  Surgeon: [name], MD;  Location: [facility];  Service: Open Heart Surgery;  Laterality: N/A;  . TRANSURETHRAL RESECTION OF PROSTATE    . TURP VAPORIZATION      Current Medications: Current Meds  Medication Sig  . acetaminophen (TYLENOL) 325 MG tablet Take 650 mg by mouth every 6 (six) hours as needed for mild pain.  [name] Kitchen amLODipine (NORVASC) 5 MG tablet TAKE 1 TABLET (5 MG TOTAL) BY MOUTH DAILY. 3RD ATTEMPT. NEEDS OFFICE VISIT.  [name] Kitchen aspirin 81 MG tablet Take 81 mg by mouth daily.  . carvedilol (COREG) 12.5 MG tablet Take 1 tablet (12.5 mg total) by mouth 2 (two) times daily with a meal. Please keep upcoming appt in  May with Dr. Radford Pax before anymore refills. Thank you  . clopidogrel (PLAVIX) 75 MG tablet Take 1 tablet (75 mg total) by mouth daily.  . diclofenac sodium (VOLTAREN) 1 % GEL Apply 2 g topically 4 (four) times daily.  . finasteride (PROSCAR) 5 MG tablet Take 5 mg by mouth daily.  . furosemide (LASIX) 20 MG tablet Take 1 tablet (20 mg total) by mouth daily. Please keep upcoming appt in May with Dr. Radford Pax before anymore refills. Thank you  . gabapentin (NEURONTIN) 300 MG capsule Take 1 tablet at bedtime for one week, then increase to 1 tablet twice daily.  . memantine (NAMENDA) 10 MG tablet Take 10 mg by mouth daily.  . montelukast (SINGULAIR) 10 MG tablet Take 10 mg by mouth daily.  . Multiple Vitamin (MULTIVITAMIN WITH MINERALS) TABS Take 1 tablet by mouth daily.  . nitroGLYCERIN (NITROSTAT) 0.4 MG SL tablet Take 0.4 mg by mouth as needed. Place one tablet under your tongue every 5 minutes as needed for chest pain  . omeprazole (PRILOSEC) 40 MG capsule Take 1 capsule (40 mg total) by mouth daily.  . potassium chloride SA (K-DUR) 20 MEQ tablet Take 1 tablet (20 mEq total) by mouth daily. (Patient taking differently: Take 20 mEq by mouth 2 (two) times daily. )  . rosuvastatin (CRESTOR) 20 MG tablet Take 1 tablet (20 mg total) by mouth daily. Please keep upcoming appt in May with Dr. Radford Pax before anymore refills. Thank you (Patient taking differently: Take 20 mg by mouth daily. Four times a week)     Allergies:   Latex, Sulfa antibiotics, and Sulfasalazine   Social History   Socioeconomic History  . Marital status: Married    Spouse name: Not on file  . Number of children: 3  . Years of education: Not on file  . Highest education level: Not on file  Occupational History  . Occupation: retired  Tobacco Use  . Smoking status: Former Smoker    Years: 20.00    Types: Cigars    Quit date: 02/10/1970    Years since quitting: 49.4  . Smokeless tobacco: Former Systems developer    Types: Chew  . Tobacco  comment: smoked cigars off and on x 20 years  Substance and Sexual Activity  . Alcohol use: Yes    Alcohol/week: 3.0 standard drinks    Types: 3 Glasses of wine per week    Comment: weekly/    occ beer/wine not recently (05/25/15)  . Drug use: No  . Sexual activity: Not Currently  Other Topics Concern  . Not on file  Social History Narrative  . Not on file  Social Determinants of Health   Financial Resource Strain:   . Difficulty of Paying Living Expenses:   Food Insecurity:   . Worried About Charity fundraiser in the Last Year:   . Arboriculturist in the Last Year:   Transportation Needs:   . Film/video editor (Medical):   [name] Kitchen Lack of Transportation (Non-Medical):   Physical Activity:   . Days of Exercise per Week:   . Minutes of Exercise per Session:   Stress:   . Feeling of Stress :   Social Connections:   . Frequency of Communication with Friends and Family:   . Frequency of Social Gatherings with Friends and Family:   . Attends Religious Services:   . Active Member of Clubs or Organizations:   . Attends Archivist Meetings:   [name] Kitchen Marital Status:      Family History: The patient's family history includes Lung cancer in his father. There is no history of Anesthesia problems or Heart attack.  ROS:   Please see the history of present illness.    ROS  All other systems reviewed and negative.   EKGs/Labs/Other Studies Reviewed:    The following studies were reviewed today: none  EKG:  EKG is  ordered today.  The ekg ordered today demonstrates NSR with LBBB  Recent Labs: No results found for requested labs within last 8760 hours.   Recent Lipid Panel    Component Value Date/Time   CHOL 141 10/20/2014 1111   TRIG 126.0 10/20/2014 1111   HDL 47.10 10/20/2014 1111   CHOLHDL 3 10/20/2014 1111   VLDL 25.2 10/20/2014 1111   LDLCALC 69 10/20/2014 1111    Physical Exam:    VS:  BP 130/80   Pulse 67   Ht 6\' 3"  (1.905 m)   Wt 245 lb (111.1 kg)    BMI 30.62 kg/m     Wt Readings from Last 3 Encounters:  06/28/19 245 lb (111.1 kg)  02/16/18 265 lb 6.4 oz (120.4 kg)  01/29/17 258 lb (117 kg)     GEN: Well nourished, well developed in no acute distress HEENT: Normal NECK: No JVD; No carotid bruits LYMPHATICS: No lymphadenopathy CARDIAC:RRR, no murmurs, rubs, gallops RESPIRATORY:  Clear to auscultation without rales, wheezing or rhonchi  ABDOMEN: Soft, non-tender, non-distended MUSCULOSKELETAL:  No edema; No deformity  SKIN: Warm and dry NEUROLOGIC:  Alert and oriented x 3 PSYCHIATRIC:  Normal affect    ASSESSMENT:    1. Atherosclerosis of native coronary artery of native heart without angina pectoris   2. Nonrheumatic aortic valve stenosis   3. Essential hypertension, benign   4. Chronic diastolic congestive heart failure ([place])   5. Pure hypercholesterolemia   6. LBBB (left bundle branch block)    PLAN:    In order of problems listed above:  1.  ASCAD  -s/p PCI of left cir and LAD 2005 and PCI of prox PDA 7/09.   -he has not had any anignal sx since I saw Banks a year ago -continue ASA, Plavix 75mg  daily, statin and Carvedilol 12.5mg  BID.   2.  Aortic Stenosis  -s/p TAVR on 05/29/15 with 29 mm Edwards Sapien 3 biopro sthetic valve.   -Echo post TAVR showed normal LVF with with mean AVG 20mmHg and no AI.   -2D echo 02/2018 showed a stable TAVR with mean AVG 46mmHg and no perivalvular AI  3.  HTN  -BP controlled on exam -continue Carvedilol 12.5mg  BID, Losartan 50mg  daily  and amlodipine 5mg  daily  4.  Chronic diastolic CHF  -appears euvolemic on exam -continue Lasix 20mg  daily  5.  Hyperlipidemia  - LDL goal is < 70.   -I will get a copy of his last FLP from his PCP -continue Crestor 20mg  daily  6.  New LBBB -he is asymptomatic -his last cath in 2017 showed patent stents -I will get a Lexiscan myoview to rule out ischemia   Medication Adjustments/Labs and Tests Ordered: Current medicines are reviewed at  length with the patient today.  Concerns regarding medicines are outlined above.  No orders of the defined types were placed in this encounter.  No orders of the defined types were placed in this encounter.   Signed, Martin Him, MD  06/28/2019 8:53 AM    [place]

## 2019-06-28 ENCOUNTER — Ambulatory Visit: Payer: Medicare HMO | Admitting: Cardiology

## 2019-06-28 ENCOUNTER — Other Ambulatory Visit: Payer: Self-pay

## 2019-06-28 VITALS — BP 130/80 | HR 67 | Ht 75.0 in | Wt 245.0 lb

## 2019-06-28 DIAGNOSIS — I10 Essential (primary) hypertension: Secondary | ICD-10-CM

## 2019-06-28 DIAGNOSIS — I447 Left bundle-branch block, unspecified: Secondary | ICD-10-CM

## 2019-06-28 DIAGNOSIS — E7800 Pure hypercholesterolemia, unspecified: Secondary | ICD-10-CM

## 2019-06-28 DIAGNOSIS — I5032 Chronic diastolic (congestive) heart failure: Secondary | ICD-10-CM

## 2019-06-28 DIAGNOSIS — R9431 Abnormal electrocardiogram [ECG] [EKG]: Secondary | ICD-10-CM

## 2019-06-28 DIAGNOSIS — I2510 Atherosclerotic heart disease of native coronary artery without angina pectoris: Secondary | ICD-10-CM

## 2019-06-28 DIAGNOSIS — I350 Nonrheumatic aortic (valve) stenosis: Secondary | ICD-10-CM | POA: Diagnosis not present

## 2019-06-28 NOTE — Addendum Note (Signed)
Addended by: Antonieta Iba on: 06/28/2019 08:59 AM   Modules accepted: Orders

## 2019-06-28 NOTE — Addendum Note (Signed)
Addended by: Patterson Hammersmith A on: 06/28/2019 01:00 PM   Modules accepted: Orders

## 2019-06-28 NOTE — Patient Instructions (Signed)
Medication Instructions:  Your physician recommends that you continue on your current medications as directed. Please refer to the Current Medication list given to you today.  *If you need a refill on your cardiac medications before your next appointment, please call your pharmacy*  Testing/Procedures: Your physician has requested that you have a lexiscan myoview. For further information please visit [email or web address]. Please follow instruction sheet, as given.  Follow-Up: At [facility], you and your health needs are our priority.  As part of our continuing mission to provide you with exceptional heart care, we have created designated Provider Care Teams.  These Care Teams include your primary Cardiologist (physician) and Advanced Practice Providers (APPs -  Physician Assistants and Nurse Practitioners) who all work together to provide you with the care you need, when you need it.  Your next appointment:   1 year(s)  The format for your next appointment:   In Person  Provider:   You may see Fransico Him, MD or one of the following Advanced Practice Providers on your designated Care Team:    Melina Copa, PA-C  Ermalinda Barrios, PA-C

## 2019-06-29 ENCOUNTER — Encounter (HOSPITAL_COMMUNITY): Payer: Self-pay | Admitting: *Deleted

## 2019-06-29 ENCOUNTER — Telehealth (HOSPITAL_COMMUNITY): Payer: Self-pay | Admitting: *Deleted

## 2019-06-29 NOTE — Telephone Encounter (Signed)
Left message on voicemail per DPR in reference to upcoming appointment scheduled on 07/06/2019 at 0730 with detailed instructions given per Myocardial Perfusion Study Information Sheet for the test. LM to arrive 15 minutes early, and that it is imperative to arrive on time for appointment to keep from having the test rescheduled. If you need to cancel or reschedule your appointment, please call the office within 24 hours of your appointment. Failure to do so may result in a cancellation of your appointment, and a $50 no show fee. Phone number given for call back for any questions.  mychart letter sent with instructions.Sophira Rumler, Ranae Palms

## 2019-07-06 ENCOUNTER — Ambulatory Visit (HOSPITAL_COMMUNITY): Payer: Medicare HMO | Attending: Cardiology

## 2019-07-06 ENCOUNTER — Other Ambulatory Visit: Payer: Self-pay

## 2019-07-06 DIAGNOSIS — R9431 Abnormal electrocardiogram [ECG] [EKG]: Secondary | ICD-10-CM | POA: Diagnosis not present

## 2019-07-06 LAB — MYOCARDIAL PERFUSION IMAGING
LV dias vol: 84 mL (ref 62–150)
LV sys vol: 26 mL
Peak HR: 79 {beats}/min
Rest HR: 59 {beats}/min
SDS: 2
SRS: 1
SSS: 3
TID: 0.92

## 2019-07-06 MED ORDER — TECHNETIUM TC 99M TETROFOSMIN IV KIT
32.7000 | PACK | Freq: Once | INTRAVENOUS | Status: AC | PRN
  Administered 2019-07-06: 32.7 via INTRAVENOUS
  Filled 2019-07-06: qty 33

## 2019-07-06 MED ORDER — REGADENOSON 0.4 MG/5ML IV SOLN
0.4000 mg | Freq: Once | INTRAVENOUS | Status: AC
Start: 2019-07-06 — End: 2019-07-06
  Administered 2019-07-06: 0.4 mg via INTRAVENOUS

## 2019-07-06 MED ORDER — TECHNETIUM TC 99M TETROFOSMIN IV KIT
10.7000 | PACK | Freq: Once | INTRAVENOUS | Status: AC | PRN
  Administered 2019-07-06: 10.7 via INTRAVENOUS
  Filled 2019-07-06: qty 11

## 2019-07-09 ENCOUNTER — Other Ambulatory Visit: Payer: Self-pay | Admitting: Cardiology

## 2019-07-15 ENCOUNTER — Other Ambulatory Visit: Payer: Self-pay | Admitting: Cardiology

## 2019-07-19 ENCOUNTER — Telehealth: Payer: Self-pay | Admitting: Cardiology

## 2019-07-19 MED ORDER — POTASSIUM CHLORIDE CRYS ER 20 MEQ PO TBCR: 40.0000 meq | EXTENDED_RELEASE_TABLET | Freq: Every day | ORAL | 3 refills | Status: DC

## 2019-07-19 NOTE — Telephone Encounter (Signed)
Pt's medication was sent to pt's pharmacy as requested. Confirmation received.  °

## 2019-07-19 NOTE — Telephone Encounter (Signed)
New message    *STAT* If patient is at the pharmacy, call can be transferred to refill team.   1. Which medications need to be refilled? (please list name of each medication and dose if known) potassium chloride SA (KLOR-CON) 20 MEQ tablet  2. Which pharmacy/location (including street and city if local pharmacy) is medication to be sent to?[id number] - [place], [place] [address]  3. Do they need a 30 day or 90 day supply?[place]

## 2019-07-20 ENCOUNTER — Other Ambulatory Visit: Payer: Self-pay | Admitting: Cardiology

## 2019-07-20 ENCOUNTER — Telehealth: Payer: Self-pay | Admitting: Cardiology

## 2019-07-20 MED ORDER — POTASSIUM CHLORIDE CRYS ER 20 MEQ PO TBCR: 40.0000 meq | EXTENDED_RELEASE_TABLET | Freq: Every day | ORAL | 0 refills | Status: DC

## 2019-07-20 MED ORDER — POTASSIUM CHLORIDE CRYS ER 20 MEQ PO TBCR: 40.0000 meq | EXTENDED_RELEASE_TABLET | Freq: Every day | ORAL | 3 refills | Status: DC

## 2019-07-20 NOTE — Telephone Encounter (Signed)
*  STAT* If patient is at the pharmacy, call can be transferred to refill team.   1. Which medications need to be refilled? (please list name of each medication and dose if known)  New prescription for his  Potassium take 2 a day now 2. Which pharmacy/location (including street and city if local pharmacy) is medication to be sent to[facility] [place] 3. Do they need a 30 day or 90 day supply? Need a 2 weeks supply until his mail order comes in

## 2019-07-20 NOTE — Telephone Encounter (Signed)
     I went in pt's chart to see if pt's Potassium was called in yesterday. It was called in on yesterday

## 2019-07-20 NOTE — Telephone Encounter (Signed)
Pt's medication was sent to local pharmacy for a 15 day supply until his mail order arrives. Confirmation received.

## 2019-09-14 DIAGNOSIS — M150 Primary generalized (osteo)arthritis: Secondary | ICD-10-CM | POA: Diagnosis not present

## 2019-09-14 DIAGNOSIS — M79606 Pain in leg, unspecified: Secondary | ICD-10-CM | POA: Diagnosis not present

## 2019-09-14 DIAGNOSIS — R269 Unspecified abnormalities of gait and mobility: Secondary | ICD-10-CM | POA: Diagnosis not present

## 2019-09-14 DIAGNOSIS — F0151 Vascular dementia with behavioral disturbance: Secondary | ICD-10-CM | POA: Diagnosis not present

## 2019-09-14 DIAGNOSIS — R2689 Other abnormalities of gait and mobility: Secondary | ICD-10-CM | POA: Diagnosis not present

## 2019-10-20 DIAGNOSIS — I110 Hypertensive heart disease with heart failure: Secondary | ICD-10-CM | POA: Diagnosis not present

## 2019-10-20 DIAGNOSIS — R1310 Dysphagia, unspecified: Secondary | ICD-10-CM | POA: Diagnosis not present

## 2019-10-20 DIAGNOSIS — I352 Nonrheumatic aortic (valve) stenosis with insufficiency: Secondary | ICD-10-CM | POA: Diagnosis not present

## 2019-10-20 DIAGNOSIS — F0391 Unspecified dementia with behavioral disturbance: Secondary | ICD-10-CM | POA: Diagnosis not present

## 2019-10-20 DIAGNOSIS — N3281 Overactive bladder: Secondary | ICD-10-CM | POA: Diagnosis not present

## 2019-10-20 DIAGNOSIS — M150 Primary generalized (osteo)arthritis: Secondary | ICD-10-CM | POA: Diagnosis not present

## 2019-10-20 DIAGNOSIS — I2510 Atherosclerotic heart disease of native coronary artery without angina pectoris: Secondary | ICD-10-CM | POA: Diagnosis not present

## 2019-10-20 DIAGNOSIS — I5030 Unspecified diastolic (congestive) heart failure: Secondary | ICD-10-CM | POA: Diagnosis not present

## 2019-10-20 DIAGNOSIS — N400 Enlarged prostate without lower urinary tract symptoms: Secondary | ICD-10-CM | POA: Diagnosis not present

## 2019-10-26 DIAGNOSIS — R1310 Dysphagia, unspecified: Secondary | ICD-10-CM | POA: Diagnosis not present

## 2019-10-26 DIAGNOSIS — I5030 Unspecified diastolic (congestive) heart failure: Secondary | ICD-10-CM | POA: Diagnosis not present

## 2019-10-26 DIAGNOSIS — M150 Primary generalized (osteo)arthritis: Secondary | ICD-10-CM | POA: Diagnosis not present

## 2019-10-26 DIAGNOSIS — N3281 Overactive bladder: Secondary | ICD-10-CM | POA: Diagnosis not present

## 2019-10-26 DIAGNOSIS — I2510 Atherosclerotic heart disease of native coronary artery without angina pectoris: Secondary | ICD-10-CM | POA: Diagnosis not present

## 2019-10-26 DIAGNOSIS — F0391 Unspecified dementia with behavioral disturbance: Secondary | ICD-10-CM | POA: Diagnosis not present

## 2019-10-26 DIAGNOSIS — I352 Nonrheumatic aortic (valve) stenosis with insufficiency: Secondary | ICD-10-CM | POA: Diagnosis not present

## 2019-10-26 DIAGNOSIS — I110 Hypertensive heart disease with heart failure: Secondary | ICD-10-CM | POA: Diagnosis not present

## 2019-10-26 DIAGNOSIS — N400 Enlarged prostate without lower urinary tract symptoms: Secondary | ICD-10-CM | POA: Diagnosis not present

## 2019-10-28 DIAGNOSIS — I352 Nonrheumatic aortic (valve) stenosis with insufficiency: Secondary | ICD-10-CM | POA: Diagnosis not present

## 2019-10-28 DIAGNOSIS — I5030 Unspecified diastolic (congestive) heart failure: Secondary | ICD-10-CM | POA: Diagnosis not present

## 2019-10-28 DIAGNOSIS — F0391 Unspecified dementia with behavioral disturbance: Secondary | ICD-10-CM | POA: Diagnosis not present

## 2019-10-28 DIAGNOSIS — R1310 Dysphagia, unspecified: Secondary | ICD-10-CM | POA: Diagnosis not present

## 2019-10-28 DIAGNOSIS — I110 Hypertensive heart disease with heart failure: Secondary | ICD-10-CM | POA: Diagnosis not present

## 2019-10-28 DIAGNOSIS — M150 Primary generalized (osteo)arthritis: Secondary | ICD-10-CM | POA: Diagnosis not present

## 2019-10-28 DIAGNOSIS — N400 Enlarged prostate without lower urinary tract symptoms: Secondary | ICD-10-CM | POA: Diagnosis not present

## 2019-10-28 DIAGNOSIS — I2510 Atherosclerotic heart disease of native coronary artery without angina pectoris: Secondary | ICD-10-CM | POA: Diagnosis not present

## 2019-10-28 DIAGNOSIS — N3281 Overactive bladder: Secondary | ICD-10-CM | POA: Diagnosis not present

## 2019-11-01 DIAGNOSIS — I2510 Atherosclerotic heart disease of native coronary artery without angina pectoris: Secondary | ICD-10-CM | POA: Diagnosis not present

## 2019-11-01 DIAGNOSIS — F0391 Unspecified dementia with behavioral disturbance: Secondary | ICD-10-CM | POA: Diagnosis not present

## 2019-11-01 DIAGNOSIS — I5030 Unspecified diastolic (congestive) heart failure: Secondary | ICD-10-CM | POA: Diagnosis not present

## 2019-11-01 DIAGNOSIS — N400 Enlarged prostate without lower urinary tract symptoms: Secondary | ICD-10-CM | POA: Diagnosis not present

## 2019-11-01 DIAGNOSIS — R1310 Dysphagia, unspecified: Secondary | ICD-10-CM | POA: Diagnosis not present

## 2019-11-01 DIAGNOSIS — I352 Nonrheumatic aortic (valve) stenosis with insufficiency: Secondary | ICD-10-CM | POA: Diagnosis not present

## 2019-11-01 DIAGNOSIS — I110 Hypertensive heart disease with heart failure: Secondary | ICD-10-CM | POA: Diagnosis not present

## 2019-11-01 DIAGNOSIS — N3281 Overactive bladder: Secondary | ICD-10-CM | POA: Diagnosis not present

## 2019-11-01 DIAGNOSIS — M150 Primary generalized (osteo)arthritis: Secondary | ICD-10-CM | POA: Diagnosis not present

## 2019-11-03 DIAGNOSIS — M150 Primary generalized (osteo)arthritis: Secondary | ICD-10-CM | POA: Diagnosis not present

## 2019-11-03 DIAGNOSIS — F0391 Unspecified dementia with behavioral disturbance: Secondary | ICD-10-CM | POA: Diagnosis not present

## 2019-11-03 DIAGNOSIS — I110 Hypertensive heart disease with heart failure: Secondary | ICD-10-CM | POA: Diagnosis not present

## 2019-11-03 DIAGNOSIS — I2510 Atherosclerotic heart disease of native coronary artery without angina pectoris: Secondary | ICD-10-CM | POA: Diagnosis not present

## 2019-11-03 DIAGNOSIS — I352 Nonrheumatic aortic (valve) stenosis with insufficiency: Secondary | ICD-10-CM | POA: Diagnosis not present

## 2019-11-03 DIAGNOSIS — N3281 Overactive bladder: Secondary | ICD-10-CM | POA: Diagnosis not present

## 2019-11-03 DIAGNOSIS — I5030 Unspecified diastolic (congestive) heart failure: Secondary | ICD-10-CM | POA: Diagnosis not present

## 2019-11-03 DIAGNOSIS — N400 Enlarged prostate without lower urinary tract symptoms: Secondary | ICD-10-CM | POA: Diagnosis not present

## 2019-11-03 DIAGNOSIS — R1310 Dysphagia, unspecified: Secondary | ICD-10-CM | POA: Diagnosis not present

## 2019-11-08 DIAGNOSIS — F0391 Unspecified dementia with behavioral disturbance: Secondary | ICD-10-CM | POA: Diagnosis not present

## 2019-11-08 DIAGNOSIS — M150 Primary generalized (osteo)arthritis: Secondary | ICD-10-CM | POA: Diagnosis not present

## 2019-11-08 DIAGNOSIS — I5030 Unspecified diastolic (congestive) heart failure: Secondary | ICD-10-CM | POA: Diagnosis not present

## 2019-11-08 DIAGNOSIS — I2510 Atherosclerotic heart disease of native coronary artery without angina pectoris: Secondary | ICD-10-CM | POA: Diagnosis not present

## 2019-11-08 DIAGNOSIS — N400 Enlarged prostate without lower urinary tract symptoms: Secondary | ICD-10-CM | POA: Diagnosis not present

## 2019-11-08 DIAGNOSIS — N3281 Overactive bladder: Secondary | ICD-10-CM | POA: Diagnosis not present

## 2019-11-08 DIAGNOSIS — I110 Hypertensive heart disease with heart failure: Secondary | ICD-10-CM | POA: Diagnosis not present

## 2019-11-08 DIAGNOSIS — I352 Nonrheumatic aortic (valve) stenosis with insufficiency: Secondary | ICD-10-CM | POA: Diagnosis not present

## 2019-11-08 DIAGNOSIS — R1310 Dysphagia, unspecified: Secondary | ICD-10-CM | POA: Diagnosis not present

## 2019-11-10 DIAGNOSIS — N3281 Overactive bladder: Secondary | ICD-10-CM | POA: Diagnosis not present

## 2019-11-10 DIAGNOSIS — I110 Hypertensive heart disease with heart failure: Secondary | ICD-10-CM | POA: Diagnosis not present

## 2019-11-10 DIAGNOSIS — M150 Primary generalized (osteo)arthritis: Secondary | ICD-10-CM | POA: Diagnosis not present

## 2019-11-10 DIAGNOSIS — I2510 Atherosclerotic heart disease of native coronary artery without angina pectoris: Secondary | ICD-10-CM | POA: Diagnosis not present

## 2019-11-10 DIAGNOSIS — R1310 Dysphagia, unspecified: Secondary | ICD-10-CM | POA: Diagnosis not present

## 2019-11-10 DIAGNOSIS — I352 Nonrheumatic aortic (valve) stenosis with insufficiency: Secondary | ICD-10-CM | POA: Diagnosis not present

## 2019-11-10 DIAGNOSIS — N400 Enlarged prostate without lower urinary tract symptoms: Secondary | ICD-10-CM | POA: Diagnosis not present

## 2019-11-10 DIAGNOSIS — I5030 Unspecified diastolic (congestive) heart failure: Secondary | ICD-10-CM | POA: Diagnosis not present

## 2019-11-10 DIAGNOSIS — F0391 Unspecified dementia with behavioral disturbance: Secondary | ICD-10-CM | POA: Diagnosis not present

## 2019-11-16 DIAGNOSIS — I352 Nonrheumatic aortic (valve) stenosis with insufficiency: Secondary | ICD-10-CM | POA: Diagnosis not present

## 2019-11-16 DIAGNOSIS — I2510 Atherosclerotic heart disease of native coronary artery without angina pectoris: Secondary | ICD-10-CM | POA: Diagnosis not present

## 2019-11-16 DIAGNOSIS — R1310 Dysphagia, unspecified: Secondary | ICD-10-CM | POA: Diagnosis not present

## 2019-11-16 DIAGNOSIS — I110 Hypertensive heart disease with heart failure: Secondary | ICD-10-CM | POA: Diagnosis not present

## 2019-11-16 DIAGNOSIS — N3281 Overactive bladder: Secondary | ICD-10-CM | POA: Diagnosis not present

## 2019-11-16 DIAGNOSIS — N400 Enlarged prostate without lower urinary tract symptoms: Secondary | ICD-10-CM | POA: Diagnosis not present

## 2019-11-16 DIAGNOSIS — M150 Primary generalized (osteo)arthritis: Secondary | ICD-10-CM | POA: Diagnosis not present

## 2019-11-16 DIAGNOSIS — F0391 Unspecified dementia with behavioral disturbance: Secondary | ICD-10-CM | POA: Diagnosis not present

## 2019-11-16 DIAGNOSIS — I5030 Unspecified diastolic (congestive) heart failure: Secondary | ICD-10-CM | POA: Diagnosis not present

## 2019-11-19 DIAGNOSIS — N400 Enlarged prostate without lower urinary tract symptoms: Secondary | ICD-10-CM | POA: Diagnosis not present

## 2019-11-19 DIAGNOSIS — F0391 Unspecified dementia with behavioral disturbance: Secondary | ICD-10-CM | POA: Diagnosis not present

## 2019-11-19 DIAGNOSIS — I352 Nonrheumatic aortic (valve) stenosis with insufficiency: Secondary | ICD-10-CM | POA: Diagnosis not present

## 2019-11-19 DIAGNOSIS — I110 Hypertensive heart disease with heart failure: Secondary | ICD-10-CM | POA: Diagnosis not present

## 2019-11-19 DIAGNOSIS — I2510 Atherosclerotic heart disease of native coronary artery without angina pectoris: Secondary | ICD-10-CM | POA: Diagnosis not present

## 2019-11-19 DIAGNOSIS — M150 Primary generalized (osteo)arthritis: Secondary | ICD-10-CM | POA: Diagnosis not present

## 2019-11-19 DIAGNOSIS — R1310 Dysphagia, unspecified: Secondary | ICD-10-CM | POA: Diagnosis not present

## 2019-11-19 DIAGNOSIS — N3281 Overactive bladder: Secondary | ICD-10-CM | POA: Diagnosis not present

## 2019-11-19 DIAGNOSIS — I5030 Unspecified diastolic (congestive) heart failure: Secondary | ICD-10-CM | POA: Diagnosis not present

## 2019-11-22 ENCOUNTER — Other Ambulatory Visit: Payer: Self-pay

## 2019-11-22 DIAGNOSIS — F0391 Unspecified dementia with behavioral disturbance: Secondary | ICD-10-CM | POA: Diagnosis not present

## 2019-11-22 DIAGNOSIS — I2510 Atherosclerotic heart disease of native coronary artery without angina pectoris: Secondary | ICD-10-CM | POA: Diagnosis not present

## 2019-11-22 DIAGNOSIS — N400 Enlarged prostate without lower urinary tract symptoms: Secondary | ICD-10-CM | POA: Diagnosis not present

## 2019-11-22 DIAGNOSIS — I352 Nonrheumatic aortic (valve) stenosis with insufficiency: Secondary | ICD-10-CM | POA: Diagnosis not present

## 2019-11-22 DIAGNOSIS — I5030 Unspecified diastolic (congestive) heart failure: Secondary | ICD-10-CM | POA: Diagnosis not present

## 2019-11-22 DIAGNOSIS — N3281 Overactive bladder: Secondary | ICD-10-CM | POA: Diagnosis not present

## 2019-11-22 DIAGNOSIS — M150 Primary generalized (osteo)arthritis: Secondary | ICD-10-CM | POA: Diagnosis not present

## 2019-11-22 DIAGNOSIS — I110 Hypertensive heart disease with heart failure: Secondary | ICD-10-CM | POA: Diagnosis not present

## 2019-11-22 DIAGNOSIS — R1310 Dysphagia, unspecified: Secondary | ICD-10-CM | POA: Diagnosis not present

## 2019-11-22 MED ORDER — AMLODIPINE BESYLATE 5 MG PO TABS: 5.0000 mg | ORAL_TABLET | Freq: Every day | ORAL | 7 refills | Status: DC

## 2019-11-22 NOTE — Telephone Encounter (Signed)
Pt's medication was sent to pt's pharmacy as requested. Confirmation received.  °

## 2019-11-30 DIAGNOSIS — R1310 Dysphagia, unspecified: Secondary | ICD-10-CM | POA: Diagnosis not present

## 2019-11-30 DIAGNOSIS — I352 Nonrheumatic aortic (valve) stenosis with insufficiency: Secondary | ICD-10-CM | POA: Diagnosis not present

## 2019-11-30 DIAGNOSIS — N400 Enlarged prostate without lower urinary tract symptoms: Secondary | ICD-10-CM | POA: Diagnosis not present

## 2019-11-30 DIAGNOSIS — F0391 Unspecified dementia with behavioral disturbance: Secondary | ICD-10-CM | POA: Diagnosis not present

## 2019-11-30 DIAGNOSIS — I5030 Unspecified diastolic (congestive) heart failure: Secondary | ICD-10-CM | POA: Diagnosis not present

## 2019-11-30 DIAGNOSIS — I110 Hypertensive heart disease with heart failure: Secondary | ICD-10-CM | POA: Diagnosis not present

## 2019-11-30 DIAGNOSIS — I2510 Atherosclerotic heart disease of native coronary artery without angina pectoris: Secondary | ICD-10-CM | POA: Diagnosis not present

## 2019-11-30 DIAGNOSIS — M150 Primary generalized (osteo)arthritis: Secondary | ICD-10-CM | POA: Diagnosis not present

## 2019-11-30 DIAGNOSIS — N3281 Overactive bladder: Secondary | ICD-10-CM | POA: Diagnosis not present

## 2019-12-15 DIAGNOSIS — I2510 Atherosclerotic heart disease of native coronary artery without angina pectoris: Secondary | ICD-10-CM | POA: Diagnosis not present

## 2019-12-15 DIAGNOSIS — R1310 Dysphagia, unspecified: Secondary | ICD-10-CM | POA: Diagnosis not present

## 2019-12-15 DIAGNOSIS — I352 Nonrheumatic aortic (valve) stenosis with insufficiency: Secondary | ICD-10-CM | POA: Diagnosis not present

## 2019-12-15 DIAGNOSIS — F0391 Unspecified dementia with behavioral disturbance: Secondary | ICD-10-CM | POA: Diagnosis not present

## 2019-12-15 DIAGNOSIS — M150 Primary generalized (osteo)arthritis: Secondary | ICD-10-CM | POA: Diagnosis not present

## 2019-12-15 DIAGNOSIS — N3281 Overactive bladder: Secondary | ICD-10-CM | POA: Diagnosis not present

## 2019-12-15 DIAGNOSIS — I110 Hypertensive heart disease with heart failure: Secondary | ICD-10-CM | POA: Diagnosis not present

## 2019-12-15 DIAGNOSIS — N400 Enlarged prostate without lower urinary tract symptoms: Secondary | ICD-10-CM | POA: Diagnosis not present

## 2019-12-15 DIAGNOSIS — I5030 Unspecified diastolic (congestive) heart failure: Secondary | ICD-10-CM | POA: Diagnosis not present

## 2020-01-12 DIAGNOSIS — I110 Hypertensive heart disease with heart failure: Secondary | ICD-10-CM | POA: Diagnosis not present

## 2020-01-12 DIAGNOSIS — N3281 Overactive bladder: Secondary | ICD-10-CM | POA: Diagnosis not present

## 2020-01-12 DIAGNOSIS — J301 Allergic rhinitis due to pollen: Secondary | ICD-10-CM | POA: Diagnosis not present

## 2020-01-12 DIAGNOSIS — I25119 Atherosclerotic heart disease of native coronary artery with unspecified angina pectoris: Secondary | ICD-10-CM | POA: Diagnosis not present

## 2020-01-12 DIAGNOSIS — F0151 Vascular dementia with behavioral disturbance: Secondary | ICD-10-CM | POA: Diagnosis not present

## 2020-01-12 DIAGNOSIS — M79606 Pain in leg, unspecified: Secondary | ICD-10-CM | POA: Diagnosis not present

## 2020-01-12 DIAGNOSIS — Z0000 Encounter for general adult medical examination without abnormal findings: Secondary | ICD-10-CM | POA: Diagnosis not present

## 2020-01-12 DIAGNOSIS — E7800 Pure hypercholesterolemia, unspecified: Secondary | ICD-10-CM | POA: Diagnosis not present

## 2020-01-12 DIAGNOSIS — I700 Atherosclerosis of aorta: Secondary | ICD-10-CM | POA: Diagnosis not present

## 2020-01-12 DIAGNOSIS — K219 Gastro-esophageal reflux disease without esophagitis: Secondary | ICD-10-CM | POA: Diagnosis not present

## 2020-01-12 DIAGNOSIS — M150 Primary generalized (osteo)arthritis: Secondary | ICD-10-CM | POA: Diagnosis not present

## 2020-01-12 DIAGNOSIS — I5030 Unspecified diastolic (congestive) heart failure: Secondary | ICD-10-CM | POA: Diagnosis not present

## 2020-01-22 ENCOUNTER — Emergency Department (HOSPITAL_COMMUNITY): Payer: Medicare HMO

## 2020-01-22 ENCOUNTER — Emergency Department (HOSPITAL_COMMUNITY)
Admission: EM | Admit: 2020-01-22 | Discharge: 2020-01-22 | Disposition: A | Discharge status: Home / Self Care | Payer: Medicare HMO | Attending: Emergency Medicine | Admitting: Emergency Medicine

## 2020-01-22 DIAGNOSIS — Z7901 Long term (current) use of anticoagulants: Secondary | ICD-10-CM | POA: Diagnosis not present

## 2020-01-22 DIAGNOSIS — N400 Enlarged prostate without lower urinary tract symptoms: Secondary | ICD-10-CM | POA: Diagnosis not present

## 2020-01-22 DIAGNOSIS — F0390 Unspecified dementia without behavioral disturbance: Secondary | ICD-10-CM | POA: Diagnosis not present

## 2020-01-22 DIAGNOSIS — R079 Chest pain, unspecified: Secondary | ICD-10-CM | POA: Insufficient documentation

## 2020-01-22 DIAGNOSIS — R911 Solitary pulmonary nodule: Secondary | ICD-10-CM | POA: Diagnosis not present

## 2020-01-22 DIAGNOSIS — R110 Nausea: Secondary | ICD-10-CM | POA: Diagnosis not present

## 2020-01-22 DIAGNOSIS — Z7982 Long term (current) use of aspirin: Secondary | ICD-10-CM | POA: Insufficient documentation

## 2020-01-22 DIAGNOSIS — J90 Pleural effusion, not elsewhere classified: Secondary | ICD-10-CM | POA: Diagnosis not present

## 2020-01-22 DIAGNOSIS — Z87891 Personal history of nicotine dependence: Secondary | ICD-10-CM | POA: Diagnosis not present

## 2020-01-22 DIAGNOSIS — I2510 Atherosclerotic heart disease of native coronary artery without angina pectoris: Secondary | ICD-10-CM | POA: Insufficient documentation

## 2020-01-22 DIAGNOSIS — R059 Cough, unspecified: Secondary | ICD-10-CM | POA: Insufficient documentation

## 2020-01-22 DIAGNOSIS — I10 Essential (primary) hypertension: Secondary | ICD-10-CM | POA: Diagnosis not present

## 2020-01-22 DIAGNOSIS — Z91040 Latex allergy status: Secondary | ICD-10-CM | POA: Insufficient documentation

## 2020-01-22 DIAGNOSIS — Z79899 Other long term (current) drug therapy: Secondary | ICD-10-CM | POA: Insufficient documentation

## 2020-01-22 DIAGNOSIS — I4430 Unspecified atrioventricular block: Secondary | ICD-10-CM | POA: Diagnosis not present

## 2020-01-22 DIAGNOSIS — M4856XA Collapsed vertebra, not elsewhere classified, lumbar region, initial encounter for fracture: Secondary | ICD-10-CM | POA: Diagnosis not present

## 2020-01-22 DIAGNOSIS — J80 Acute respiratory distress syndrome: Secondary | ICD-10-CM | POA: Diagnosis not present

## 2020-01-22 DIAGNOSIS — K449 Diaphragmatic hernia without obstruction or gangrene: Secondary | ICD-10-CM | POA: Diagnosis not present

## 2020-01-22 DIAGNOSIS — I517 Cardiomegaly: Secondary | ICD-10-CM | POA: Diagnosis not present

## 2020-01-22 DIAGNOSIS — K219 Gastro-esophageal reflux disease without esophagitis: Secondary | ICD-10-CM | POA: Diagnosis not present

## 2020-01-22 DIAGNOSIS — R1110 Vomiting, unspecified: Secondary | ICD-10-CM | POA: Diagnosis not present

## 2020-01-22 DIAGNOSIS — Z96659 Presence of unspecified artificial knee joint: Secondary | ICD-10-CM | POA: Insufficient documentation

## 2020-01-22 DIAGNOSIS — R0789 Other chest pain: Secondary | ICD-10-CM | POA: Diagnosis not present

## 2020-01-22 DIAGNOSIS — Z20822 Contact with and (suspected) exposure to covid-19: Secondary | ICD-10-CM | POA: Diagnosis not present

## 2020-01-22 DIAGNOSIS — I499 Cardiac arrhythmia, unspecified: Secondary | ICD-10-CM | POA: Diagnosis not present

## 2020-01-22 DIAGNOSIS — I5032 Chronic diastolic (congestive) heart failure: Secondary | ICD-10-CM | POA: Diagnosis not present

## 2020-01-22 DIAGNOSIS — I213 ST elevation (STEMI) myocardial infarction of unspecified site: Secondary | ICD-10-CM | POA: Diagnosis not present

## 2020-01-22 LAB — COMPREHENSIVE METABOLIC PANEL
ALT: 31 U/L (ref 0–44)
AST: 48 U/L — ABNORMAL HIGH (ref 15–41)
Albumin: 3.5 g/dL (ref 3.5–5.0)
Alkaline Phosphatase: 105 U/L (ref 38–126)
Anion gap: 10 (ref 5–15)
BUN: 11 mg/dL (ref 8–23)
CO2: 18 mmol/L — ABNORMAL LOW (ref 22–32)
Calcium: 8.7 mg/dL — ABNORMAL LOW (ref 8.9–10.3)
Chloride: 110 mmol/L (ref 98–111)
Creatinine, Ser: 0.79 mg/dL (ref 0.61–1.24)
GFR, Estimated: >60 mL/min (ref >60)
Glucose, Bld: 133 mg/dL — ABNORMAL HIGH (ref 70–99)
Potassium: 5.5 mmol/L — ABNORMAL HIGH (ref 3.5–5.1)
Sodium: 138 mmol/L (ref 135–145)
Total Bilirubin: 1.8 mg/dL — ABNORMAL HIGH (ref 0.3–1.2)
Total Protein: 6.1 g/dL — ABNORMAL LOW (ref 6.5–8.1)

## 2020-01-22 LAB — CBC WITH DIFFERENTIAL/PLATELET
Abs Immature Granulocytes: 0.05 10*3/uL (ref 0.00–0.07)
Basophils Absolute: 0 10*3/uL (ref 0.0–0.1)
Basophils Relative: 0 %
Eosinophils Absolute: 0.1 10*3/uL (ref 0.0–0.5)
Eosinophils Relative: 1 %
HCT: 46.9 % (ref 39.0–52.0)
Hemoglobin: 15.7 g/dL (ref 13.0–17.0)
Immature Granulocytes: 1 %
Lymphocytes Relative: 10 %
Lymphs Abs: 0.9 10*3/uL (ref 0.7–4.0)
MCH: 31 pg (ref 26.0–34.0)
MCHC: 33.5 g/dL (ref 30.0–36.0)
MCV: 92.5 fL (ref 80.0–100.0)
Monocytes Absolute: 0.6 10*3/uL (ref 0.1–1.0)
Monocytes Relative: 7 %
Neutro Abs: 7.4 10*3/uL (ref 1.7–7.7)
Neutrophils Relative %: 81 %
Platelets: 208 10*3/uL (ref 150–400)
RBC: 5.07 MIL/uL (ref 4.22–5.81)
RDW: 13 % (ref 11.5–15.5)
WBC: 9 10*3/uL (ref 4.0–10.5)
nRBC: 0 % (ref 0.0–0.2)

## 2020-01-22 LAB — TROPONIN I (HIGH SENSITIVITY)
Troponin I (High Sensitivity): 7 ng/L (ref <18)
Troponin I (High Sensitivity): 11 ng/L (ref <18)

## 2020-01-22 LAB — LIPASE, BLOOD: Lipase: 26 U/L (ref 11–51)

## 2020-01-22 LAB — BRAIN NATRIURETIC PEPTIDE: B Natriuretic Peptide: 71.4 pg/mL (ref 0.0–100.0)

## 2020-01-22 LAB — RESP PANEL BY RT-PCR (FLU A&B, COVID) ARPGX2
Influenza A by PCR: NEGATIVE
Influenza B by PCR: NEGATIVE
SARS Coronavirus 2 by RT PCR: NEGATIVE

## 2020-01-22 MED ORDER — IOHEXOL 350 MG/ML SOLN
75.0000 mL | Freq: Once | INTRAVENOUS | Status: AC | PRN
  Administered 2020-01-22: 22:00:00 75 mL via INTRAVENOUS

## 2020-01-22 MED ORDER — ONDANSETRON HCL 4 MG/2ML IJ SOLN
4.0000 mg | Freq: Once | INTRAMUSCULAR | Status: AC
  Administered 2020-01-22: 20:00:00 4 mg via INTRAVENOUS
  Filled 2020-01-22: qty 2

## 2020-01-22 MED ORDER — FENTANYL CITRATE (PF) 100 MCG/2ML IJ SOLN
50.0000 ug | Freq: Once | INTRAMUSCULAR | Status: AC
  Administered 2020-01-22: 22:00:00 50 ug via INTRAVENOUS
  Filled 2020-01-22: qty 2

## 2020-01-22 MED ORDER — ASPIRIN 81 MG PO CHEW
324.0000 mg | CHEWABLE_TABLET | Freq: Once | ORAL | Status: DC
  Filled 2020-01-22: qty 4

## 2020-01-22 MED ORDER — AMLODIPINE BESYLATE 5 MG PO TABS
5.0000 mg | ORAL_TABLET | Freq: Once | ORAL | Status: AC
  Administered 2020-01-22: 5 mg via ORAL
  Filled 2020-01-22: qty 1

## 2020-01-22 NOTE — ED Notes (Signed)
Called lab, they will add BNP to samples currently in lab.

## 2020-01-22 NOTE — ED Notes (Signed)
ED Provider at bedside. 

## 2020-01-22 NOTE — ED Triage Notes (Signed)
Patient reports chest pain x 3 days. Reports also falling in bathroom 3 days ago, but denies trauma/injury r/t same. Reports he caught himself before falling too hard. Vomiting on arrival, denies nausea/vomiting prior to same. Patient did receive morphine with EMS. Hx of 3 stents and heart valve replacement.

## 2020-01-22 NOTE — Discharge Instructions (Signed)
If you develop recurrent, continued, or worsening chest pain, shortness of breath, fever, vomiting, abdominal or back pain, or any other new/concerning symptoms then return to the ER for evaluation.   On your CT scan there was a lung nodule.  This will need to be followed up by your primary care physician with a repeat CT scan in about 1 year.

## 2020-01-22 NOTE — ED Provider Notes (Signed)
[facility] EMERGENCY DEPARTMENT Provider Note   CSN: [id number] Arrival date & time: 01/22/20  1906     History Chief Complaint  Patient presents with  . Chest Pain    [name] is a 86 y.o. male.  HPI 86 year old male presents with chest pain.  Came in via EMS.  Has been having on and off chest pain for 2 days or so.  It is sharp in nature.  At times it will be about a 7 out of 10.  Some cough.  He was given morphine by EMS and since then he has had vomiting.  Prior to this no vomiting.  No abdominal pain or back pain.  Past Medical History:  Diagnosis Date  . Allergic rhinitis   . Anxiety   . Aortic insufficiency   . Aortic stenosis, severe    S/P TAVR  . Arthritis   . BPH (benign prostatic hypertrophy)   . Bruises easily   . Chronic diastolic congestive heart failure ([place])   . Chronic fatigue   . Coronary artery disease    s/p PCI of left cir and LAD 2005 and PCI of prox PDA 7/09  . Dementia ([place])    early per pt- recently evaluated at [facility]- eccho7/13  , MRI head EPIC 6/13  . Dementia, vascular ([place])    Dr [name]  . Essential hypertension, benign   . GERD (gastroesophageal reflux disease)   . HBP (high blood pressure)   . Headache(784.0)    relieved with OTC's  . Hyperlipidemia   . Hypertension   . Incontinence of urine   . Left carotid bruit    w minimal obstruction on doppler  . OAB (overactive bladder)   . Osteoarthritis of left knee   . Pure hypercholesterolemia   . 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 Unsteady gait     Patient Active Problem List   Diagnosis Date Noted  . Claudication ([place]) 12/01/2016  . 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  . Chronic diastolic congestive heart failure ([place])   . H/O total knee replacement 02/21/2014  . Hypersomnia 10/20/2013  .  Edema 04/13/2013  . Essential hypertension, benign 04/12/2013  . Coronary atherosclerosis of native coronary artery   . Pure hypercholesterolemia   . Aortic stenosis   . PNA (pneumonia) 03/21/2013  . Influenza with respiratory manifestations 03/19/2013  . Osteoarthritis of left knee 09/19/2011  . Lateral meniscus tear, current 09/19/2011  . Medial meniscus, posterior horn derangement 09/19/2011    Past Surgical History:  Procedure Laterality Date  . CARDIAC CATHETERIZATION     3 stents first cath 2 stents placed then additional stent placed  . CARDIAC CATHETERIZATION N/A 04/19/2015   Procedure: Right/Left Heart Cath and Coronary Angiography;  Surgeon: [name], MD;  Location: [place] CV LAB;  Service: Cardiovascular;  Laterality: N/A;  . COLONOSCOPY W/ POLYPECTOMY    . colonscopy     . EYE SURGERY Bilateral    cataract surgery bilat   . HEMORROIDECTOMY    . KNEE ARTHROSCOPY  09/19/2011   Procedure: ARTHROSCOPY KNEE;  Surgeon: [name], MD;  Location: [facility];  Service: Orthopedics;  Laterality: Left;  . ROTATOR CUFF REPAIR Right 3/13   right  . stent placed     x3 per pt  . TEE WITHOUT CARDIOVERSION N/A 05/29/2015   Procedure: TRANSESOPHAGEAL  ECHOCARDIOGRAM (TEE);  Surgeon: [name], MD;  Location: [place];  Service: Open Heart Surgery;  Laterality: N/A;  . TONSILLECTOMY    . TOTAL KNEE ARTHROPLASTY Left 02/21/2014   Procedure: LEFT TOTAL KNEE ARTHROPLASTY;  Surgeon: [name], MD;  Location: [facility];  Service: Orthopedics;  Laterality: Left;  . TRANSCATHETER AORTIC VALVE REPLACEMENT, TRANSFEMORAL N/A 05/29/2015   Procedure: TRANSCATHETER AORTIC VALVE REPLACEMENT, TRANSFEMORAL;  Surgeon: [name], MD;  Location: [place];  Service: Open Heart Surgery;  Laterality: N/A;  . TRANSURETHRAL RESECTION OF PROSTATE    . TURP VAPORIZATION         Family History  Problem Relation Age of Onset  . Lung cancer Father   . Anesthesia problems Neg  Hx   . Heart attack Neg Hx     Social History   Tobacco Use  . Smoking status: Former Smoker    Years: 20.00    Types: Cigars    Quit date: 02/10/1970    Years since quitting: 49.9  . Smokeless tobacco: Former Systems developer    Types: Chew  . Tobacco comment: smoked cigars off and on x 20 years  Vaping Use  . Vaping Use: Never used  Substance Use Topics  . Alcohol use: Yes    Alcohol/week: 3.0 standard drinks    Types: 3 Glasses of wine per week    Comment: weekly/    occ beer/wine not recently (05/25/15)  . Drug use: No    Home Medications Prior to Admission medications   Medication Sig Start Date End Date Taking? Authorizing Provider  acetaminophen (TYLENOL) 325 MG tablet Take 650 mg by mouth every 6 (six) hours as needed for mild pain.    [provider]  amLODipine (NORVASC) 5 MG tablet Take 1 tablet (5 mg total) by mouth daily. 11/22/19   Sueanne Margarita, MD  aspirin 81 MG tablet Take 81 mg by mouth daily.    [provider]  carvedilol (COREG) 12.5 MG tablet TAKE 1 TAB TWICE DAILY WITH A MEAL (PLEASE KEEP UPCOMING APPOINTMENT IN MAY WITH DR TURNER BEFORE ANYMORE REFILLS) 07/12/19   Sueanne Margarita, MD  clopidogrel (PLAVIX) 75 MG tablet Take 1 tablet (75 mg total) by mouth daily. 05/14/15   Sueanne Margarita, MD  diclofenac sodium (VOLTAREN) 1 % GEL Apply 2 g topically 4 (four) times daily. 02/26/17   Jamse Arn, MD  finasteride (PROSCAR) 5 MG tablet Take 5 mg by mouth daily.    [provider]  furosemide (LASIX) 20 MG tablet TAKE 1 TABLET DAILY (PLEASE KEEP UPCOMING APPOINTMENT IN MAY WITH DR TURNER BEFORE ANYMORE REFILLS) 07/12/19   Sueanne Margarita, MD  gabapentin (NEURONTIN) 300 MG capsule Take 1 tablet at bedtime for one week, then increase to 1 tablet twice daily. 01/29/17   Patel, Arvin Collard K, DO  memantine (NAMENDA) 10 MG tablet Take 10 mg by mouth daily. 01/04/16   [provider]  montelukast (SINGULAIR) 10 MG tablet Take 10 mg by mouth daily.  02/16/15   [provider]  Multiple Vitamin (MULTIVITAMIN WITH MINERALS) TABS Take 1 tablet by mouth daily.    [provider]  nitroGLYCERIN (NITROSTAT) 0.4 MG SL tablet Take 0.4 mg by mouth as needed. Place one tablet under your tongue every 5 minutes as needed for chest pain    [provider]  omeprazole (PRILOSEC) 40 MG capsule TAKE 1 CAPSULE EVERY DAY 07/15/19   Sueanne Margarita, MD  potassium  chloride SA (KLOR-CON) 20 MEQ tablet Take 2 tablets (40 mEq total) by mouth daily. 07/20/19   Sueanne Margarita, MD  rosuvastatin (CRESTOR) 20 MG tablet TAKE 1 TABLET DAILY (PLEASE KEEP UPCOMING APPOINTMENT IN MAY WITH DR TURNER BEFORE ANYMORE REFILLS) Patient taking differently: Take 20 mg by mouth daily. 07/12/19   Sueanne Margarita, MD    Allergies    Latex, Sulfa antibiotics, and Sulfasalazine  Review of Systems   Review of Systems  Constitutional: Negative for fever.  Respiratory: Positive for cough and shortness of breath.   Cardiovascular: Positive for chest pain.  Gastrointestinal: Positive for vomiting. Negative for abdominal pain.  Musculoskeletal: Negative for back pain.  All other systems reviewed and are negative.   Physical Exam Updated Vital Signs BP (!) 163/88   Pulse 78   Temp 97.9 F (36.6 C) (Oral)   Resp 15   SpO2 96%   Physical Exam Vitals and nursing note reviewed.  Constitutional:      Appearance: He is well-developed and well-nourished. He is obese.  HENT:     Head: Normocephalic and atraumatic.     Right Ear: External ear normal.     Left Ear: External ear normal.     Nose: Nose normal.  Eyes:     General:        Right eye: No discharge.        Left eye: No discharge.  Cardiovascular:     Rate and Rhythm: Normal rate and regular rhythm.     Heart sounds: Normal heart sounds.  Pulmonary:     Effort: Pulmonary effort is normal.     Breath sounds: Normal breath sounds.  Chest:     Chest wall: Tenderness present.  Abdominal:      Palpations: Abdomen is soft.     Tenderness: There is no abdominal tenderness.  Musculoskeletal:        General: No edema.     Cervical back: Neck supple.  Skin:    General: Skin is warm and dry.  Neurological:     Mental Status: He is alert.  Psychiatric:        Mood and Affect: Mood is not anxious.     ED Results / Procedures / Treatments   Labs (all labs ordered are listed, but only abnormal results are displayed) Labs Reviewed  COMPREHENSIVE METABOLIC PANEL - Abnormal; Notable for the following components:      Result Value   Potassium 5.5 (*)    CO2 18 (*)    Glucose, Bld 133 (*)    Calcium 8.7 (*)    Total Protein 6.1 (*)    AST 48 (*)    Total Bilirubin 1.8 (*)    All other components within normal limits  RESP PANEL BY RT-PCR (FLU A&B, COVID) ARPGX2  LIPASE, BLOOD  CBC WITH DIFFERENTIAL/PLATELET  BRAIN NATRIURETIC PEPTIDE  TROPONIN I (HIGH SENSITIVITY)  TROPONIN I (HIGH SENSITIVITY)    EKG None  Radiology DG Chest 2 View  Result Date: 01/22/2020 CLINICAL DATA:  Chest pain for 2-3 days, sternal pain for 3 days, fall 3 days prior EXAM: CHEST - 2 VIEW COMPARISON:  Radiograph 01/04/2018, CT 05/09/2015 FINDINGS: Cardiomegaly is similar to priors with evidence of prior transcatheter aortic valve replacement which is in stable positioning. The aorta is calcified. The remaining cardiomediastinal contours are unremarkable. Lung volumes are low with some increased hazy opacities towards the mid to lower lungs. Superimposed hazy interstitial opacities with central vascular congestion and redistribution  as well as fissural and septal thickening could reflect some mild interstitial edema as well. No pneumothorax. No visible effusion. No focal consolidative opacity. No acute osseous or soft tissue abnormality. Degenerative changes are present in the imaged spine and shoulders. Telemetry leads overlie the chest. IMPRESSION: 1. Low lung volumes with some increased hazy opacities  towards the mid to lower lungs. Findings may reflect a combination of atelectasis and/or edema. 2. Cardiomegaly. 3. Prior TAVR. 4.  Aortic Atherosclerosis (ICD10-I70.0). Electronically Signed   By: Lovena Le M.D.   On: 01/22/2020 20:17   CT Angio Chest/Abd/Pel for Dissection W and/or Wo Contrast  Result Date: 01/22/2020 CLINICAL DATA:  Abdominal pain, aortic dissection suspected Patient reports chest pain for 3 days with vomiting. EXAM: CT ANGIOGRAPHY CHEST, ABDOMEN AND PELVIS TECHNIQUE: Non-contrast CT of the chest was initially obtained. Multidetector CT imaging through the chest, abdomen and pelvis was performed using the standard protocol during bolus administration of intravenous contrast. Multiplanar reconstructed images and MIPs were obtained and reviewed to evaluate the vascular anatomy. CONTRAST:  52mL OMNIPAQUE IOHEXOL 350 MG/ML SOLN COMPARISON:  Chest radiograph earlier today.  Chest CT a 3297 FINDINGS: CTA CHEST FINDINGS Cardiovascular: Prior TAVR. No aortic dissection. No aortic hematoma noncontrast exam. Mild aortic atherosclerosis without acute aortic syndrome or aneurysm. Conventional branching pattern from the aortic arch, the branch vessels are patent. Heart is normal in size. Exam not tailored to pulmonary artery evaluation, there are no obvious filling defects within the main pulmonary artery. Scattered coronary artery calcifications. No pericardial effusion. Mediastinum/Nodes: Small to moderate hiatal hernia. Mild distal esophageal wall thickening. Small mediastinal lymph nodes not enlarged by size criteria. No hilar adenopathy. No thyroid nodule. Lungs/Pleura: Breathing motion artifact and low lung volumes. Dependent opacities in both lower lobes typical of atelectasis. No septal thickening or findings of pulmonary edema. There is no pleural fluid. Small subpleural nodule in the right upper lobe, series 7, image 28 measures approximately 4 mm, not definitively seen on prior exam. Small  focal subpleural calcification in the anterior left upper lobe is unchanged. Musculoskeletal: Thoracic spondylosis with multilevel degenerative change. There are no acute or suspicious osseous abnormalities. Review of the MIP images confirms the above findings. CTA ABDOMEN AND PELVIS FINDINGS VASCULAR Aorta: Normal caliber aorta without aneurysm, dissection, vasculitis or significant stenosis. Mild tortuosity with moderate atherosclerosis. Celiac: Patent without evidence of aneurysm, dissection, vasculitis or significant stenosis. Replaced hepatic artery arising from the SMA. SMA: Patent without evidence of aneurysm, dissection, vasculitis or significant stenosis. Replaced hepatic artery arising from the SMA. Renals: Both renal arteries are patent without evidence of aneurysm, dissection, vasculitis, fibromuscular dysplasia or significant stenosis. Calcification at the origin of the right renal artery but no significant stenosis. IMA: Patent without evidence of aneurysm, dissection, vasculitis or significant stenosis. Inflow: Patent without evidence of aneurysm, dissection, vasculitis or significant stenosis. Tortuous with mild to moderate atherosclerosis. Veins: Duplicated IVC. No acute venous abnormalities are seen on this arterial phase exam. Review of the MIP images confirms the above findings. NON-VASCULAR Hepatobiliary: Suspected hepatic steatosis not well assessed on this arterial phase exam. No discrete focal hepatic abnormality. Gallbladder physiologically distended, no calcified stone. No biliary dilatation. Pancreas: No ductal dilatation or inflammation. Spleen: Normal in size and arterial enhancement. Adrenals/Urinary Tract: 1.6 cm right adrenal nodule, 1.4 cm left adrenal nodule, stable from prior exam. Kidneys symmetric in size without hydronephrosis or perinephric edema. No evidence of focal renal abnormality. Urinary bladder is partially distended. Superior aspect of the bladder is  trabeculated.  Small right lateral bladder diverticulum. Stomach/Bowel: Small to moderate hiatal hernia. Stomach is decompressed. Normal positioning of the duodenum and ligament of Treitz. Unremarkable small bowel without obstruction or inflammatory change. Normal appendix. Small to moderate colonic stool burden. Occasional distal descending and sigmoid colonic diverticula without diverticulitis. Sigmoid colon is tortuous and courses into the mid abdomen. No colonic wall thickening or inflammatory change. Lymphatic: No abdominopelvic adenopathy. Reproductive: Enlarged prostate gland spans 6.7 cm transverse. This causes mass effect on the bladder base. Other: No free air or ascites. Small fat containing umbilical hernia. Musculoskeletal: Moderately severe L1 compression fracture is chronic and not significantly changed from 06/06/2019 lumbar spine radiograph. There are no acute or suspicious osseous abnormalities. Review of the MIP images confirms the above findings. IMPRESSION: 1. Aortic atherosclerosis and prior trans aortic valve replacement. No acute aortic abnormality. 2. Small to moderate hiatal hernia with distal esophageal wall thickening, can be seen with reflux. 3. Small 4 mm subpleural nodule in the right upper lobe, not definitively seen on prior exam. No follow-up needed if patient is low-risk. Non-contrast chest CT can be considered in 12 months if patient is high-risk. This recommendation follows the consensus statement: Guidelines for Management of Incidental Pulmonary Nodules Detected on CT Images: From the Fleischner Society 2017; Radiology 2017; 284:228-243. 4. Enlarged prostate gland causing mass effect on the bladder base. Trabeculated urinary bladder and small bladder diverticula suggesting chronic bladder outlet obstruction. 5. Colonic diverticulosis without diverticulitis. 6. Suspected hepatic steatosis. 7. Chronic L1 compression fracture. Aortic Atherosclerosis (ICD10-I70.0). Electronically Signed   By:  Keith Rake M.D.   On: 01/22/2020 22:38    Procedures Procedures (including critical care time)  Medications Ordered in ED Medications  aspirin chewable tablet 324 mg (324 mg Oral Not Given 01/22/20 2012)  ondansetron [facility]) injection 4 mg (4 mg Intravenous Given 01/22/20 2010)  fentaNYL (SUBLIMAZE) injection 50 mcg (50 mcg Intravenous Given 01/22/20 2159)  iohexol (OMNIPAQUE) 350 MG/ML injection 75 mL (75 mLs Intravenous Contrast Given 01/22/20 2209)    ED Course  I have reviewed the triage vital signs and the nursing notes.  Pertinent labs & imaging results that were available during my care of the patient were reviewed by me and considered in my medical decision making (see chart for details).    MDM Rules/Calculators/A&P                          Patient's chest pain is of unclear etiology. Troponins negative x 2. ECG without acute ischemia. Family does now tell me that he fell a couple days ago and may have hurt his chest. Dissection study is negative besides incidental findings. Will need follow up for the nodule. I doubt this is unstable angina. Will d/c home with PCP follow up.  Final Clinical Impression(s) / ED Diagnoses Final diagnoses:  Nonspecific chest pain  Lung nodule    Rx / DC Orders ED Discharge Orders    None       Sherwood Gambler, MD 01/22/20 2327

## 2020-01-22 NOTE — ED Notes (Signed)
Pt to xray

## 2020-01-22 NOTE — ED Notes (Signed)
Pt to CT

## 2020-01-22 NOTE — ED Notes (Signed)
Patient back from x-ray 

## 2020-01-23 ENCOUNTER — Telehealth: Payer: Self-pay | Admitting: Cardiology

## 2020-01-23 DIAGNOSIS — R079 Chest pain, unspecified: Secondary | ICD-10-CM | POA: Diagnosis not present

## 2020-01-23 DIAGNOSIS — I4510 Unspecified right bundle-branch block: Secondary | ICD-10-CM | POA: Diagnosis not present

## 2020-01-23 DIAGNOSIS — I10 Essential (primary) hypertension: Secondary | ICD-10-CM | POA: Diagnosis not present

## 2020-01-23 DIAGNOSIS — R000 Tachycardia, unspecified: Secondary | ICD-10-CM | POA: Diagnosis not present

## 2020-01-23 DIAGNOSIS — R0789 Other chest pain: Secondary | ICD-10-CM | POA: Diagnosis not present

## 2020-01-23 NOTE — Telephone Encounter (Signed)
Patient's wife called and stated the patient was sent to ER via EMS. Said when discharged and got home, still experienced same problem up until now. Symptoms has not changed. Calling EMS back to take patient back to hospital. Wanted to let Dr. Radford Pax and nurse know situation.

## 2020-01-24 NOTE — Telephone Encounter (Signed)
Pt wife called back in and wanted to talk with Carlye again.  She stated she just re checked his bp and it was 186/114.   Best number

## 2020-01-24 NOTE — Telephone Encounter (Signed)
Spoke with the patient's wife who reports that the patient's BP keeps going up. She took his BP multiple times in a row with readings of 181/108, 186/114, and 190/110. He denies any chest pain, SOB, dizziness, headaches, or dizziness. She reports that he did take a hydrocodone earlier for his pain.  He was recently taken off of amlodipine by his PCP, however the wife did start it back yesterday due to his elevated BP. This AM he took amlodipine 5 mg, furosemide 40 mg, and carvedilol 12.5 mg.  He has not taken his 2nd dose of carvedilol today, which I advised that he go ahead and take.  She does report that he has been eating a lot of soup recently which I advised that he avoids along with other salty food. Wife was advised if patient becomes symptomatic and BP does not go down to call EMS again. She verbalized understanding.

## 2020-01-24 NOTE — Telephone Encounter (Signed)
Spoke with the patient's wife who states that yesterday EMS came out and did an EKG. They told him that it looked good and everything was okay from a cardiac standpoint and did not transport him to the hospital. He has been having spasms in his chest that are painful. She states that he had a fall a few days ago and has a hernia that they were told is pushing up against a hematoma from the fall.  The wife states that the patient is feeling better today - denies any chest pain or shortness of breath.  His blood pressure has remained elevated. Today it was 177/98. She states that he is currently resting. He is supposed to follow up with his PCP. Advised wife to continue to monitor his BP and let us know. Will also make Dr. Radford Pax aware of elevated BP.

## 2020-01-26 NOTE — Telephone Encounter (Signed)
Please get him in with his PCP today since they are managing his BP meds and recently took him off amlodipine

## 2020-01-26 NOTE — Telephone Encounter (Signed)
Spoke with the patient's wife. She states that yesterday his BP got up to 200/100. Once he took his 2nd dose of carvedilol it came down to 152/70. She states that today he is feeling a bit better, however he is having some confusion. He denies any chest pain.  I have talked with Dr. Raul Del office and they have him scheduled to come in at their first available appointment on Dec 21st at 8:30am. They are keeping the patient on the waitlist and will call if any spots open up sooner.   The wife is aware of the appointment and ER precautions have been reviewed. She verbalized understanding.

## 2020-01-31 DIAGNOSIS — I110 Hypertensive heart disease with heart failure: Secondary | ICD-10-CM | POA: Diagnosis not present

## 2020-01-31 DIAGNOSIS — R079 Chest pain, unspecified: Secondary | ICD-10-CM | POA: Diagnosis not present

## 2020-01-31 DIAGNOSIS — R410 Disorientation, unspecified: Secondary | ICD-10-CM | POA: Diagnosis not present

## 2020-02-11 ENCOUNTER — Emergency Department (HOSPITAL_COMMUNITY): Payer: Medicare HMO

## 2020-02-11 ENCOUNTER — Encounter (HOSPITAL_COMMUNITY): Payer: Self-pay | Admitting: Emergency Medicine

## 2020-02-11 ENCOUNTER — Other Ambulatory Visit: Payer: Self-pay

## 2020-02-11 ENCOUNTER — Emergency Department (HOSPITAL_COMMUNITY)
Admission: EM | Admit: 2020-02-11 | Discharge: 2020-02-11 | Disposition: A | Discharge status: Home / Self Care | Payer: Medicare HMO | Attending: Emergency Medicine | Admitting: Emergency Medicine

## 2020-02-11 DIAGNOSIS — W19XXXA Unspecified fall, initial encounter: Secondary | ICD-10-CM | POA: Diagnosis not present

## 2020-02-11 DIAGNOSIS — F0151 Vascular dementia with behavioral disturbance: Secondary | ICD-10-CM | POA: Diagnosis not present

## 2020-02-11 DIAGNOSIS — R0902 Hypoxemia: Secondary | ICD-10-CM | POA: Diagnosis not present

## 2020-02-11 DIAGNOSIS — R059 Cough, unspecified: Secondary | ICD-10-CM | POA: Diagnosis not present

## 2020-02-11 DIAGNOSIS — Z79899 Other long term (current) drug therapy: Secondary | ICD-10-CM | POA: Insufficient documentation

## 2020-02-11 DIAGNOSIS — I700 Atherosclerosis of aorta: Secondary | ICD-10-CM | POA: Diagnosis not present

## 2020-02-11 DIAGNOSIS — I110 Hypertensive heart disease with heart failure: Secondary | ICD-10-CM | POA: Diagnosis not present

## 2020-02-11 DIAGNOSIS — Z87891 Personal history of nicotine dependence: Secondary | ICD-10-CM | POA: Insufficient documentation

## 2020-02-11 DIAGNOSIS — F0390 Unspecified dementia without behavioral disturbance: Secondary | ICD-10-CM | POA: Insufficient documentation

## 2020-02-11 DIAGNOSIS — M150 Primary generalized (osteo)arthritis: Secondary | ICD-10-CM | POA: Diagnosis not present

## 2020-02-11 DIAGNOSIS — I5032 Chronic diastolic (congestive) heart failure: Secondary | ICD-10-CM | POA: Diagnosis not present

## 2020-02-11 DIAGNOSIS — R0602 Shortness of breath: Secondary | ICD-10-CM | POA: Insufficient documentation

## 2020-02-11 DIAGNOSIS — R0689 Other abnormalities of breathing: Secondary | ICD-10-CM | POA: Diagnosis not present

## 2020-02-11 DIAGNOSIS — I2510 Atherosclerotic heart disease of native coronary artery without angina pectoris: Secondary | ICD-10-CM | POA: Insufficient documentation

## 2020-02-11 DIAGNOSIS — R079 Chest pain, unspecified: Secondary | ICD-10-CM | POA: Diagnosis not present

## 2020-02-11 DIAGNOSIS — R0789 Other chest pain: Secondary | ICD-10-CM | POA: Diagnosis not present

## 2020-02-11 DIAGNOSIS — I5030 Unspecified diastolic (congestive) heart failure: Secondary | ICD-10-CM | POA: Diagnosis not present

## 2020-02-11 DIAGNOSIS — Z96652 Presence of left artificial knee joint: Secondary | ICD-10-CM | POA: Diagnosis not present

## 2020-02-11 DIAGNOSIS — Z91040 Latex allergy status: Secondary | ICD-10-CM | POA: Insufficient documentation

## 2020-02-11 DIAGNOSIS — Z20822 Contact with and (suspected) exposure to covid-19: Secondary | ICD-10-CM | POA: Diagnosis not present

## 2020-02-11 DIAGNOSIS — J45909 Unspecified asthma, uncomplicated: Secondary | ICD-10-CM | POA: Diagnosis not present

## 2020-02-11 DIAGNOSIS — J301 Allergic rhinitis due to pollen: Secondary | ICD-10-CM | POA: Diagnosis not present

## 2020-02-11 DIAGNOSIS — Z7982 Long term (current) use of aspirin: Secondary | ICD-10-CM | POA: Diagnosis not present

## 2020-02-11 DIAGNOSIS — I350 Nonrheumatic aortic (valve) stenosis: Secondary | ICD-10-CM | POA: Diagnosis not present

## 2020-02-11 LAB — BASIC METABOLIC PANEL
Anion gap: 11 (ref 5–15)
BUN: 12 mg/dL (ref 8–23)
CO2: 22 mmol/L (ref 22–32)
Calcium: 9 mg/dL (ref 8.9–10.3)
Chloride: 104 mmol/L (ref 98–111)
Creatinine, Ser: 0.77 mg/dL (ref 0.61–1.24)
GFR, Estimated: >60 mL/min (ref >60)
Glucose, Bld: 132 mg/dL — ABNORMAL HIGH (ref 70–99)
Potassium: 3.9 mmol/L (ref 3.5–5.1)
Sodium: 137 mmol/L (ref 135–145)

## 2020-02-11 LAB — TROPONIN I (HIGH SENSITIVITY): Troponin I (High Sensitivity): 7 ng/L (ref <18)

## 2020-02-11 LAB — CBC WITH DIFFERENTIAL/PLATELET
Abs Immature Granulocytes: 0.04 10*3/uL (ref 0.00–0.07)
Basophils Absolute: 0 10*3/uL (ref 0.0–0.1)
Basophils Relative: 0 %
Eosinophils Absolute: 0.1 10*3/uL (ref 0.0–0.5)
Eosinophils Relative: 1 %
HCT: 51.3 % (ref 39.0–52.0)
Hemoglobin: 17.1 g/dL — ABNORMAL HIGH (ref 13.0–17.0)
Immature Granulocytes: 1 %
Lymphocytes Relative: 14 %
Lymphs Abs: 1 10*3/uL (ref 0.7–4.0)
MCH: 31 pg (ref 26.0–34.0)
MCHC: 33.3 g/dL (ref 30.0–36.0)
MCV: 92.9 fL (ref 80.0–100.0)
Monocytes Absolute: 0.6 10*3/uL (ref 0.1–1.0)
Monocytes Relative: 8 %
Neutro Abs: 5.5 10*3/uL (ref 1.7–7.7)
Neutrophils Relative %: 76 %
Platelets: 250 10*3/uL (ref 150–400)
RBC: 5.52 MIL/uL (ref 4.22–5.81)
RDW: 12.6 % (ref 11.5–15.5)
WBC: 7.3 10*3/uL (ref 4.0–10.5)
nRBC: 0 % (ref 0.0–0.2)

## 2020-02-11 LAB — RESP PANEL BY RT-PCR (FLU A&B, COVID) ARPGX2
Influenza A by PCR: NEGATIVE
Influenza B by PCR: NEGATIVE
SARS Coronavirus 2 by RT PCR: NEGATIVE

## 2020-02-11 MED ORDER — LIDOCAINE 5 % EX PTCH
1.0000 | MEDICATED_PATCH | Freq: Once | CUTANEOUS | Status: DC
  Administered 2020-02-11: 1 via TRANSDERMAL
  Filled 2020-02-11: qty 1

## 2020-02-11 NOTE — ED Notes (Addendum)

## 2020-02-11 NOTE — ED Provider Notes (Signed)
[facility] EMERGENCY DEPARTMENT Provider Note   CSN: [id number] Arrival date & time: 02/11/20  1103     History Chief Complaint  Patient presents with  . Fall    Xzaviar Canel Elgart is a 87 y.o. male.  MARWIN COTE is a 87 y.o. male with a history of hypertension, GERD, dementia, hyperlipidemia, chronic fatigue, CAD, CHF, who presents to the emergency department Via EMS for evaluation of low O2 saturations.  The patient's wife is at bedside who helps provide history.  Patient's home health care nurse came out for a routine visit today, patient's O2 sats was found to be at 93%, and did not seem to be improving, she became concerned about this as patient has had a cough and has been complaining of chest pain ever since he had a fall 2 weeks ago.  He was evaluated in the emergency department after this fall and was not found to have any broken ribs or traumatic intrathoracic injury, thought to have soft tissue chest wall injury.  He has continued to complain of this pain wife reports but he has not reported any worsening or complained of shortness of breath.  Patient is unsure of any known sick contacts.  Patient has had both Covid vaccines.  He has not had any lower extremity swelling.  No new falls or injury to the chest.  Per EMS patient noted to have O2 sats of 98% on their evaluation and was in no distress, he was put on 3 L nasal cannula for comfort, in the ED on arrival maintains normal O2 sats on room air.  Reviewed ED visit and imaging from fall on 12/12 with no acute intrathoracic injuries noted.  Level five caveat: Dementia        Past Medical History:  Diagnosis Date  . Allergic rhinitis   . Anxiety   . Aortic insufficiency   . Aortic stenosis, severe    S/P TAVR  . Arthritis   . BPH (benign prostatic hypertrophy)   . Bruises easily   . Chronic diastolic congestive heart failure ([place])   . Chronic fatigue   . Coronary artery disease    s/p PCI of left cir and LAD 2005 and  PCI of prox PDA 7/09  . Dementia ([place])    early per pt- recently evaluated at [facility]- eccho7/13  , MRI head EPIC 6/13  . Dementia, vascular ([place])    Dr [name]  . Essential hypertension, benign   . GERD (gastroesophageal reflux disease)   . HBP (high blood pressure)   . Headache(784.0)    relieved with OTC's  . Hyperlipidemia   . Hypertension   . Incontinence of urine   . Left carotid bruit    w minimal obstruction on doppler  . OAB (overactive bladder)   . Osteoarthritis of left knee   . Pure hypercholesterolemia   . 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 Unsteady gait     Patient Active Problem List   Diagnosis Date Noted  . Claudication ([place]) 12/01/2016  . 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  . Chronic diastolic congestive heart failure ([place])   . H/O total knee replacement 02/21/2014  . Hypersomnia 10/20/2013  . Edema 04/13/2013  . Essential hypertension, benign 04/12/2013  . Coronary atherosclerosis of native coronary artery   . Pure hypercholesterolemia   . Aortic stenosis   . PNA (  pneumonia) 03/21/2013  . Influenza with respiratory manifestations 03/19/2013  . Osteoarthritis of left knee 09/19/2011  . Lateral meniscus tear, current 09/19/2011  . Medial meniscus, posterior horn derangement 09/19/2011    Past Surgical History:  Procedure Laterality Date  . CARDIAC CATHETERIZATION     3 stents first cath 2 stents placed then additional stent placed  . CARDIAC CATHETERIZATION N/A 04/19/2015   Procedure: Right/Left Heart Cath and Coronary Angiography;  Surgeon: Lennette Bihari, MD;  Location: [facility] INVASIVE CV LAB;  Service: Cardiovascular;  Laterality: N/A;  . COLONOSCOPY W/ POLYPECTOMY    . colonscopy     . EYE SURGERY Bilateral    cataract surgery bilat   . HEMORROIDECTOMY    . KNEE ARTHROSCOPY  09/19/2011    Procedure: ARTHROSCOPY KNEE;  Surgeon: Jacki Cones, MD;  Location: [facility] ORS;  Service: Orthopedics;  Laterality: Left;  . ROTATOR CUFF REPAIR Right 3/13   right  . stent placed     x3 per pt  . TEE WITHOUT CARDIOVERSION N/A 05/29/2015   Procedure: TRANSESOPHAGEAL ECHOCARDIOGRAM (TEE);  Surgeon: Kathleene Hazel, MD;  Location: [facility] OR;  Service: Open Heart Surgery;  Laterality: N/A;  . TONSILLECTOMY    . TOTAL KNEE ARTHROPLASTY Left 02/21/2014   Procedure: LEFT TOTAL KNEE ARTHROPLASTY;  Surgeon: Jacki Cones, MD;  Location: [facility] ORS;  Service: Orthopedics;  Laterality: Left;  . TRANSCATHETER AORTIC VALVE REPLACEMENT, TRANSFEMORAL N/A 05/29/2015   Procedure: TRANSCATHETER AORTIC VALVE REPLACEMENT, TRANSFEMORAL;  Surgeon: Kathleene Hazel, MD;  [facility] OR;  Service: Open Heart Surgery;  Laterality: N/A;  . TRANSURETHRAL RESECTION OF PROSTATE    . TURP VAPORIZATION         Family History  Problem Relation Age of Onset  . Lung cancer Father   . Anesthesia problems Neg Hx   . Heart attack Neg Hx     Social History   Tobacco Use  . Smoking status: Former Smoker    Years: 20.00    Types: Cigars    Quit date: 02/10/1970    Years since quitting: 50.0  . Smokeless tobacco: Former Neurosurgeon    Types: Chew  . Tobacco comment: smoked cigars off and on x 20 years  Vaping Use  . Vaping Use: Never used  Substance Use Topics  . Alcohol use: Yes    Alcohol/week: 3.0 standard drinks    Types: 3 Glasses of wine per week    Comment: weekly/    occ beer/wine not recently (05/25/15)  . Drug use: No    Home Medications Prior to Admission medications   Medication Sig Start Date End Date Taking? Authorizing Provider  acetaminophen (TYLENOL) 325 MG tablet Take 650 mg by mouth every 6 (six) hours as needed for mild pain.    [provider]  amLODipine (NORVASC) 5 MG tablet Take 1 tablet (5 mg total) by mouth daily. 11/22/19   Quintella Reichert, MD  aspirin 81 MG tablet Take 81  mg by mouth daily.    [provider]  carvedilol (COREG) 12.5 MG tablet TAKE 1 TAB TWICE DAILY WITH A MEAL (PLEASE KEEP UPCOMING APPOINTMENT IN MAY WITH DR TURNER BEFORE ANYMORE REFILLS) 07/12/19   Quintella Reichert, MD  clopidogrel (PLAVIX) 75 MG tablet Take 1 tablet (75 mg total) by mouth daily. 05/14/15   Quintella Reichert, MD  diclofenac sodium (VOLTAREN) 1 % GEL Apply 2 g topically 4 (four) times daily. 02/26/17   Marcello Fennel, MD  finasteride (  PROSCAR) 5 MG tablet Take 5 mg by mouth daily.    [provider]  furosemide (LASIX) 20 MG tablet TAKE 1 TABLET DAILY (PLEASE KEEP UPCOMING APPOINTMENT IN MAY WITH DR TURNER BEFORE ANYMORE REFILLS) 07/12/19   Quintella Reichert, MD  gabapentin (NEURONTIN) 300 MG capsule Take 1 tablet at bedtime for one week, then increase to 1 tablet twice daily. 01/29/17   Patel, Roxana Hires K, DO  memantine (NAMENDA) 10 MG tablet Take 10 mg by mouth daily. 01/04/16   [provider]  montelukast (SINGULAIR) 10 MG tablet Take 10 mg by mouth daily. 02/16/15   [provider]  Multiple Vitamin (MULTIVITAMIN WITH MINERALS) TABS Take 1 tablet by mouth daily.    [provider]  nitroGLYCERIN (NITROSTAT) 0.4 MG SL tablet Take 0.4 mg by mouth as needed. Place one tablet under your tongue every 5 minutes as needed for chest pain    [provider]  omeprazole (PRILOSEC) 40 MG capsule TAKE 1 CAPSULE EVERY DAY 07/15/19   Turner, Cornelious Bryant, MD  potassium chloride SA (KLOR-CON) 20 MEQ tablet Take 2 tablets (40 mEq total) by mouth daily. 07/20/19   Quintella Reichert, MD  rosuvastatin (CRESTOR) 20 MG tablet TAKE 1 TABLET DAILY (PLEASE KEEP UPCOMING APPOINTMENT IN MAY WITH DR TURNER BEFORE ANYMORE REFILLS) Patient taking differently: Take 20 mg by mouth daily. 07/12/19   Quintella Reichert, MD    Allergies    Latex, Sulfa antibiotics, and Sulfasalazine  Review of Systems   Review of Systems  Unable to perform ROS: Dementia    Physical Exam Updated  Vital Signs BP (!) 146/96   Pulse 83   Temp 98.1 F (36.7 C) (Oral)   Resp 20   Ht 6\' 3"  (1.905 m)   Wt 104.3 kg   SpO2 95%   BMI 28.75 kg/m   Physical Exam Vitals and nursing note reviewed.  Constitutional:      General: He is not in acute distress.    Appearance: Normal appearance. He is well-developed and well-nourished. He is not diaphoretic.     Comments: Elderly gentleman, alert and pleasantly demented, in no acute distress  HENT:     Head: Normocephalic and atraumatic.     Mouth/Throat:     Mouth: Oropharynx is clear and moist. Mucous membranes are moist.     Pharynx: Oropharynx is clear.  Eyes:     General:        Right eye: No discharge.        Left eye: No discharge.     Extraocular Movements: EOM normal.  Cardiovascular:     Rate and Rhythm: Normal rate and regular rhythm.     Pulses: Normal pulses and intact distal pulses.     Heart sounds: Normal heart sounds. No murmur heard. No friction rub. No gallop.   Pulmonary:     Effort: Pulmonary effort is normal. No respiratory distress.     Breath sounds: Normal breath sounds. No wheezing or rales.     Comments: Respirations equal and unlabored, patient satting at 95% on room air with no increased work of breathing, on auscultation lungs clear with breath sounds present and equal bilaterally.  Patient does have some tenderness over the left mid chest along the nipple line without overlying skin changes or palpable deformity, pain reproducible with palpation. Chest:     Chest wall: Tenderness present.  Abdominal:     General: Bowel sounds are normal. There is no distension.  Palpations: Abdomen is soft. There is no mass.     Tenderness: There is no abdominal tenderness. There is no guarding.     Comments: Abdomen soft, nondistended, nontender to palpation in all quadrants without guarding or peritoneal signs  Musculoskeletal:        General: No deformity or edema.     Cervical back: Neck supple.     Right lower  leg: No edema.     Left lower leg: No edema.  Skin:    General: Skin is warm and dry.     Capillary Refill: Capillary refill takes less than 2 seconds.  Neurological:     Mental Status: He is alert.     Coordination: Coordination normal.     Comments: Speech is clear, able to follow commands, pleasantly demented Moves extremities without ataxia, coordination intact  Psychiatric:        Mood and Affect: Mood normal.        Behavior: Behavior normal.     ED Results / Procedures / Treatments   Labs (all labs ordered are listed, but only abnormal results are displayed) Labs Reviewed  BASIC METABOLIC PANEL - Abnormal; Notable for the following components:      Result Value   Glucose, Bld 132 (*)    All other components within normal limits  CBC WITH DIFFERENTIAL/PLATELET - Abnormal; Notable for the following components:   Hemoglobin 17.1 (*)    All other components within normal limits  RESP PANEL BY RT-PCR (FLU A&B, COVID) ARPGX2  TROPONIN I (HIGH SENSITIVITY)    EKG EKG Interpretation  Date/Time:  Saturday February 11 2020 11:18:23 EST Ventricular Rate:  80 PR Interval:    QRS Duration: 177 QT Interval:  443 QTC Calculation: 512 R Axis:   -48 Text Interpretation: Sinus rhythm Prolonged PR interval Left bundle branch block agree, LBBB new compared to first previous Confirmed by Charlesetta Shanks [phone]) on 02/12/2020 9:12:43 PM   Radiology DG Chest Port 1 View  Result Date: 02/11/2020 CLINICAL DATA:  Chest pain, cough EXAM: PORTABLE CHEST 1 VIEW COMPARISON:  01/12/2020 FINDINGS: Calcified granuloma in the left upper lobe Heart and mediastinal contours are within normal limits. No focal opacities or effusions. No acute bony abnormality. IMPRESSION: No active cardiopulmonary disease. Electronically Signed   By: Rolm Baptise M.D.   On: 02/11/2020 13:18   Procedures Procedures (including critical care time)  Medications Ordered in ED Medications - No data to display  ED  Course  I have reviewed the triage vital signs and the nursing notes.  Pertinent labs & imaging results that were available during my care of the patient were reviewed by me and considered in my medical decision making (see chart for details).    MDM Rules/Calculators/A&P                         87 year old male arrives via EMS after home health nurse noted O2 sats of 93% and became concerned, patient has had an intermittent cough and has been continued upper chest pain after a fall 2 weeks ago, patient was evaluated in the ED during this fall and had chest CT which was reassuring.  Patient has not had any fevers and has otherwise been at baseline.  Has not been complaining of shortness of breath.  Patient with normal O2 sats with EMS and here in the ED upon arrival on room air patient in no acute respiratory distress, lungs clear, satting at 95%.  He does report left chest wall pain which is reproducible with palpation, no new injury or falls and no overlying skin changes.  Will check chest x-ray, basic lab work, troponins, EKG and Covid test.  In the meantime we will treat discomfort symptomatically.  Patient is not in any respiratory distress and is not experiencing any shortness of breath at this time.  I have independently ordered, reviewed and interpreted all labs and imaging: CBC: No leukocytosis, hemoglobin slightly elevated CMP: Glucose of 132 but no other electrolyte derangements, normal renal function. Troponin: Negative, do not feel that delta troponin is indicated as pain has been present since fall a few weeks ago  EKG sinus rhythm with left bundle branch block which is not new when compared to EKG on 06/28/2019  Chest x-ray today with no evidence of pneumonia or other active cardiopulmonary disease.  Covid/flu: Negative  Patient has maintained normal O2 sats today with no increased work of breathing or shortness of breath.  His work-up has been very reassuring.  Musculoskeletal  chest pain treated and improved here in the ED.  Do not feel this requires further work-up at this time.   At this time there does not appear to be any evidence of an acute emergency medical condition and the patient appears stable for discharge with appropriate outpatient follow up.Diagnosis was discussed with patient who verbalizes understanding and is agreeable to discharge. Pt case discussed with Dr. Roderic Palau who agrees with my plan.    Final Clinical Impression(s) / ED Diagnoses Final diagnoses:  Left-sided chest pain    Rx / DC Orders ED Discharge Orders    None       Janet Berlin 02/17/20 Laqueta Due, MD 02/20/20 1511

## 2020-02-11 NOTE — ED Notes (Signed)
PA to bedside at this time.

## 2020-02-11 NOTE — ED Triage Notes (Signed)
Per EMS, pt fell 2 weeks ago and was evaluated at that time. Home health nurse reported having trouble getting pt o2 saturation up. Pt noted to be 98% for EMS, EMS states had pt on 3 liters for comfort. Pt noted to be 98% on room air in ED. Pt denies pain or change from "every other day".

## 2020-02-11 NOTE — ED Notes (Signed)
X Ray at bedside at this time.  

## 2020-02-11 NOTE — Discharge Instructions (Signed)
Evaluation today was very reassuring, lab work, Covid test and chest x-ray look good.  Likely chest wall pain, you can use over-the-counter Salonpas lidocaine patches which can be applied for 12 hours and then remove for 12 hours you can also take Tylenol.  Please follow-up with your primary care doctor if symptoms or not improving.

## 2020-02-14 DIAGNOSIS — F0151 Vascular dementia with behavioral disturbance: Secondary | ICD-10-CM | POA: Diagnosis not present

## 2020-02-14 DIAGNOSIS — I350 Nonrheumatic aortic (valve) stenosis: Secondary | ICD-10-CM | POA: Diagnosis not present

## 2020-02-14 DIAGNOSIS — M150 Primary generalized (osteo)arthritis: Secondary | ICD-10-CM | POA: Diagnosis not present

## 2020-02-14 DIAGNOSIS — J301 Allergic rhinitis due to pollen: Secondary | ICD-10-CM | POA: Diagnosis not present

## 2020-02-14 DIAGNOSIS — I700 Atherosclerosis of aorta: Secondary | ICD-10-CM | POA: Diagnosis not present

## 2020-02-14 DIAGNOSIS — J45909 Unspecified asthma, uncomplicated: Secondary | ICD-10-CM | POA: Diagnosis not present

## 2020-02-14 DIAGNOSIS — I110 Hypertensive heart disease with heart failure: Secondary | ICD-10-CM | POA: Diagnosis not present

## 2020-02-14 DIAGNOSIS — I5030 Unspecified diastolic (congestive) heart failure: Secondary | ICD-10-CM | POA: Diagnosis not present

## 2020-02-14 DIAGNOSIS — I2510 Atherosclerotic heart disease of native coronary artery without angina pectoris: Secondary | ICD-10-CM | POA: Diagnosis not present

## 2020-02-20 DIAGNOSIS — I700 Atherosclerosis of aorta: Secondary | ICD-10-CM | POA: Diagnosis not present

## 2020-02-20 DIAGNOSIS — I350 Nonrheumatic aortic (valve) stenosis: Secondary | ICD-10-CM | POA: Diagnosis not present

## 2020-02-20 DIAGNOSIS — I2510 Atherosclerotic heart disease of native coronary artery without angina pectoris: Secondary | ICD-10-CM | POA: Diagnosis not present

## 2020-02-20 DIAGNOSIS — M150 Primary generalized (osteo)arthritis: Secondary | ICD-10-CM | POA: Diagnosis not present

## 2020-02-20 DIAGNOSIS — F0151 Vascular dementia with behavioral disturbance: Secondary | ICD-10-CM | POA: Diagnosis not present

## 2020-02-20 DIAGNOSIS — J301 Allergic rhinitis due to pollen: Secondary | ICD-10-CM | POA: Diagnosis not present

## 2020-02-20 DIAGNOSIS — J45909 Unspecified asthma, uncomplicated: Secondary | ICD-10-CM | POA: Diagnosis not present

## 2020-02-20 DIAGNOSIS — I5030 Unspecified diastolic (congestive) heart failure: Secondary | ICD-10-CM | POA: Diagnosis not present

## 2020-02-20 DIAGNOSIS — I110 Hypertensive heart disease with heart failure: Secondary | ICD-10-CM | POA: Diagnosis not present

## 2020-02-23 ENCOUNTER — Other Ambulatory Visit: Payer: Self-pay | Admitting: Cardiology

## 2020-02-23 NOTE — Telephone Encounter (Signed)
Pt's pharmacy is requesting a refill on omeprazole. Would Dr. Turner like to refill this medication? Please address 

## 2020-02-29 DIAGNOSIS — J301 Allergic rhinitis due to pollen: Secondary | ICD-10-CM | POA: Diagnosis not present

## 2020-02-29 DIAGNOSIS — I2510 Atherosclerotic heart disease of native coronary artery without angina pectoris: Secondary | ICD-10-CM | POA: Diagnosis not present

## 2020-02-29 DIAGNOSIS — F0151 Vascular dementia with behavioral disturbance: Secondary | ICD-10-CM | POA: Diagnosis not present

## 2020-02-29 DIAGNOSIS — I5030 Unspecified diastolic (congestive) heart failure: Secondary | ICD-10-CM | POA: Diagnosis not present

## 2020-02-29 DIAGNOSIS — I110 Hypertensive heart disease with heart failure: Secondary | ICD-10-CM | POA: Diagnosis not present

## 2020-02-29 DIAGNOSIS — I700 Atherosclerosis of aorta: Secondary | ICD-10-CM | POA: Diagnosis not present

## 2020-02-29 DIAGNOSIS — M150 Primary generalized (osteo)arthritis: Secondary | ICD-10-CM | POA: Diagnosis not present

## 2020-02-29 DIAGNOSIS — I350 Nonrheumatic aortic (valve) stenosis: Secondary | ICD-10-CM | POA: Diagnosis not present

## 2020-02-29 DIAGNOSIS — J45909 Unspecified asthma, uncomplicated: Secondary | ICD-10-CM | POA: Diagnosis not present

## 2020-03-06 DIAGNOSIS — I110 Hypertensive heart disease with heart failure: Secondary | ICD-10-CM | POA: Diagnosis not present

## 2020-03-06 DIAGNOSIS — I700 Atherosclerosis of aorta: Secondary | ICD-10-CM | POA: Diagnosis not present

## 2020-03-06 DIAGNOSIS — I2510 Atherosclerotic heart disease of native coronary artery without angina pectoris: Secondary | ICD-10-CM | POA: Diagnosis not present

## 2020-03-06 DIAGNOSIS — J45909 Unspecified asthma, uncomplicated: Secondary | ICD-10-CM | POA: Diagnosis not present

## 2020-03-06 DIAGNOSIS — M150 Primary generalized (osteo)arthritis: Secondary | ICD-10-CM | POA: Diagnosis not present

## 2020-03-06 DIAGNOSIS — I5030 Unspecified diastolic (congestive) heart failure: Secondary | ICD-10-CM | POA: Diagnosis not present

## 2020-03-06 DIAGNOSIS — J301 Allergic rhinitis due to pollen: Secondary | ICD-10-CM | POA: Diagnosis not present

## 2020-03-06 DIAGNOSIS — I350 Nonrheumatic aortic (valve) stenosis: Secondary | ICD-10-CM | POA: Diagnosis not present

## 2020-03-06 DIAGNOSIS — F0151 Vascular dementia with behavioral disturbance: Secondary | ICD-10-CM | POA: Diagnosis not present

## 2020-03-12 DIAGNOSIS — I350 Nonrheumatic aortic (valve) stenosis: Secondary | ICD-10-CM | POA: Diagnosis not present

## 2020-03-12 DIAGNOSIS — J301 Allergic rhinitis due to pollen: Secondary | ICD-10-CM | POA: Diagnosis not present

## 2020-03-12 DIAGNOSIS — M150 Primary generalized (osteo)arthritis: Secondary | ICD-10-CM | POA: Diagnosis not present

## 2020-03-12 DIAGNOSIS — J45909 Unspecified asthma, uncomplicated: Secondary | ICD-10-CM | POA: Diagnosis not present

## 2020-03-12 DIAGNOSIS — I110 Hypertensive heart disease with heart failure: Secondary | ICD-10-CM | POA: Diagnosis not present

## 2020-03-12 DIAGNOSIS — F0151 Vascular dementia with behavioral disturbance: Secondary | ICD-10-CM | POA: Diagnosis not present

## 2020-03-12 DIAGNOSIS — I700 Atherosclerosis of aorta: Secondary | ICD-10-CM | POA: Diagnosis not present

## 2020-03-12 DIAGNOSIS — R350 Frequency of micturition: Secondary | ICD-10-CM | POA: Diagnosis not present

## 2020-03-12 DIAGNOSIS — I2510 Atherosclerotic heart disease of native coronary artery without angina pectoris: Secondary | ICD-10-CM | POA: Diagnosis not present

## 2020-03-12 DIAGNOSIS — I5030 Unspecified diastolic (congestive) heart failure: Secondary | ICD-10-CM | POA: Diagnosis not present

## 2020-03-21 DIAGNOSIS — I350 Nonrheumatic aortic (valve) stenosis: Secondary | ICD-10-CM | POA: Diagnosis not present

## 2020-03-21 DIAGNOSIS — I5030 Unspecified diastolic (congestive) heart failure: Secondary | ICD-10-CM | POA: Diagnosis not present

## 2020-03-21 DIAGNOSIS — F0151 Vascular dementia with behavioral disturbance: Secondary | ICD-10-CM | POA: Diagnosis not present

## 2020-03-21 DIAGNOSIS — I2510 Atherosclerotic heart disease of native coronary artery without angina pectoris: Secondary | ICD-10-CM | POA: Diagnosis not present

## 2020-03-21 DIAGNOSIS — I110 Hypertensive heart disease with heart failure: Secondary | ICD-10-CM | POA: Diagnosis not present

## 2020-03-21 DIAGNOSIS — J45909 Unspecified asthma, uncomplicated: Secondary | ICD-10-CM | POA: Diagnosis not present

## 2020-03-21 DIAGNOSIS — I700 Atherosclerosis of aorta: Secondary | ICD-10-CM | POA: Diagnosis not present

## 2020-03-21 DIAGNOSIS — M150 Primary generalized (osteo)arthritis: Secondary | ICD-10-CM | POA: Diagnosis not present

## 2020-03-21 DIAGNOSIS — J301 Allergic rhinitis due to pollen: Secondary | ICD-10-CM | POA: Diagnosis not present

## 2020-04-04 DIAGNOSIS — J45909 Unspecified asthma, uncomplicated: Secondary | ICD-10-CM | POA: Diagnosis not present

## 2020-04-04 DIAGNOSIS — M150 Primary generalized (osteo)arthritis: Secondary | ICD-10-CM | POA: Diagnosis not present

## 2020-04-04 DIAGNOSIS — I5030 Unspecified diastolic (congestive) heart failure: Secondary | ICD-10-CM | POA: Diagnosis not present

## 2020-04-04 DIAGNOSIS — I350 Nonrheumatic aortic (valve) stenosis: Secondary | ICD-10-CM | POA: Diagnosis not present

## 2020-04-04 DIAGNOSIS — J301 Allergic rhinitis due to pollen: Secondary | ICD-10-CM | POA: Diagnosis not present

## 2020-04-04 DIAGNOSIS — I110 Hypertensive heart disease with heart failure: Secondary | ICD-10-CM | POA: Diagnosis not present

## 2020-04-04 DIAGNOSIS — I2510 Atherosclerotic heart disease of native coronary artery without angina pectoris: Secondary | ICD-10-CM | POA: Diagnosis not present

## 2020-04-04 DIAGNOSIS — I700 Atherosclerosis of aorta: Secondary | ICD-10-CM | POA: Diagnosis not present

## 2020-04-04 DIAGNOSIS — F0151 Vascular dementia with behavioral disturbance: Secondary | ICD-10-CM | POA: Diagnosis not present

## 2020-04-17 ENCOUNTER — Telehealth: Payer: Self-pay

## 2020-04-17 NOTE — Telephone Encounter (Signed)
   [facility] Pre-operative Risk Assessment     Request for surgical clearance:  1. What type of surgery is being performed? Multiple teeth (4 or more) Extraction and alveoplasty    2. When is this surgery scheduled? TBD  3. What type of clearance is required (medical clearance vs. Pharmacy clearance to hold med vs. Both)? BOTH  4. Are there any medications that need to be held prior to surgery and how long? Plavix   5. Practice name and name of physician performing surgery? Dr. Donavan Burnet [facility]  6. What is the office phone number? [phone]   7.   What is the office fax number? [phone]  8.   Anesthesia type (None, local, MAC, general) ? Nitrous Oxide and Local Anesthesia using 2% Lidocaine with 1:100,000 Epinephrine

## 2020-04-18 NOTE — Telephone Encounter (Signed)
Appt schedule with Dr Fransico Him 03/24

## 2020-04-18 NOTE — Telephone Encounter (Signed)
   Primary Cardiologist: Fransico Him, MD  Chart reviewed as part of pre-operative protocol coverage. Because of Hatim R Bourn's past medical history and time since last visit, he will require a follow-up visit in order to better assess preoperative cardiovascular risk.  Pre-op covering staff: - Please schedule appointment and call patient to inform them. If patient already had an upcoming appointment within acceptable timeframe, please add "pre-op clearance" to the appointment notes so provider is aware. - Please contact requesting surgeon's office via preferred method (i.e, phone, fax) to inform them of need for appointment prior to surgery.  If applicable, this message will also be routed to pharmacy pool and/or primary cardiologist for input on holding anticoagulant/antiplatelet agent as requested below so that this information is available to the clearing provider at time of patient's appointment.   [place], [place]  04/18/2020, 2:25 PM

## 2020-04-19 ENCOUNTER — Ambulatory Visit: Payer: Medicare HMO | Admitting: Cardiology

## 2020-04-26 ENCOUNTER — Ambulatory Visit: Payer: Medicare HMO | Admitting: Student

## 2020-05-03 ENCOUNTER — Other Ambulatory Visit: Payer: Self-pay

## 2020-05-03 ENCOUNTER — Encounter: Payer: Self-pay | Admitting: Cardiology

## 2020-05-03 ENCOUNTER — Ambulatory Visit: Payer: Medicare HMO | Admitting: Cardiology

## 2020-05-03 VITALS — BP 124/80 | HR 65 | Ht 75.0 in | Wt 234.4 lb

## 2020-05-03 DIAGNOSIS — Z01818 Encounter for other preprocedural examination: Secondary | ICD-10-CM | POA: Diagnosis not present

## 2020-05-03 DIAGNOSIS — I5032 Chronic diastolic (congestive) heart failure: Secondary | ICD-10-CM

## 2020-05-03 DIAGNOSIS — I10 Essential (primary) hypertension: Secondary | ICD-10-CM | POA: Diagnosis not present

## 2020-05-03 DIAGNOSIS — E7800 Pure hypercholesterolemia, unspecified: Secondary | ICD-10-CM | POA: Diagnosis not present

## 2020-05-03 DIAGNOSIS — I447 Left bundle-branch block, unspecified: Secondary | ICD-10-CM | POA: Diagnosis not present

## 2020-05-03 DIAGNOSIS — I350 Nonrheumatic aortic (valve) stenosis: Secondary | ICD-10-CM | POA: Diagnosis not present

## 2020-05-03 DIAGNOSIS — I2510 Atherosclerotic heart disease of native coronary artery without angina pectoris: Secondary | ICD-10-CM

## 2020-05-03 MED ORDER — AMLODIPINE BESYLATE 5 MG PO TABS: 5.0000 mg | ORAL_TABLET | Freq: Every day | ORAL | 7 refills | Status: DC

## 2020-05-03 MED ORDER — AMOXICILLIN 500 MG PO TABS: ORAL_TABLET | ORAL | 11 refills | Status: AC

## 2020-05-03 MED ORDER — CARVEDILOL 12.5 MG PO TABS: ORAL_TABLET | ORAL | 3 refills | Status: DC

## 2020-05-03 MED ORDER — FUROSEMIDE 20 MG PO TABS: ORAL_TABLET | ORAL | 3 refills | Status: DC

## 2020-05-03 NOTE — Patient Instructions (Signed)
Medication Instructions:  Your physician has recommended you make the following change in your medication: 1) Take Amoxicillin 2g one hour prior to your dental procedure  *If you need a refill on your cardiac medications before your next appointment, please call your pharmacy*   Lab Work: Fasting lipids and ALT on 3/28 between 7:30am and 4:30pm  If you have labs (blood work) drawn today and your tests are completely normal, you will receive your results only by: [name] Kitchen MyChart Message (if you have MyChart) OR . A paper copy in the mail If you have any lab test that is abnormal or we need to change your treatment, we will call you to review the results.  Follow-Up: At [facility], you and your health needs are our priority.  As part of our continuing mission to provide you with exceptional heart care, we have created designated Provider Care Teams.  These Care Teams include your primary Cardiologist (physician) and Advanced Practice Providers (APPs -  Physician Assistants and Nurse Practitioners) who all work together to provide you with the care you need, when you need it.  Your next appointment:   1 year(s)  The format for your next appointment:   In Person  Provider:   You may see Fransico Him, MD or one of the following Advanced Practice Providers on your designated Care Team:    Melina Copa, PA-C  Ermalinda Barrios, PA-C

## 2020-05-03 NOTE — Progress Notes (Signed)
Cardiology Office Note:    Date:  05/03/2020   ID:  [name], DOB 1934/01/17, MRN [id number]  PCP:  [name], MD  Cardiologist:  Fransico Him, MD    Referring MD: [name], MD   Chief Complaint  Patient presents with  . Coronary Artery Disease  . Hypertension  . Hyperlipidemia  . Aortic Stenosis  . Congestive Heart Failure    History of Present Illness:    [name] is a 87 y.o. male with a hx of severe aortic valve stenosis who is s/p TAVR on 05/29/15 with 29 mm Edwards Sapien 3 bioprosthetic valve. He also has a history of ASCAD s/p PCI of left cir and LAD 2005 and PCI of prox PDA 7/09.  He has HTN, hyperlipidemia and chronic diastolic CHF.    He is here today for followup and is doing well.  He did have an episode of chest pain about 2 months ago when a 5' clock fell on him.  Apparently his BP was running high at that time as well due to his PCP taking him off some BP meds and with the pain of the clock falling on him it went higher.  About 2 weeks after the clock fell, he started having pain in the left upper abdomen and chest and was much worse with movement deep breathing and was sore to touch.  He saw his PCP who felt he had bruised his chest wall.  Since then he went back on his BP meds. The pain in his chest wall resolved over time and he has not had any further episode.  He denies any anginal chest pain.   He denies any SOB, DOE, PND, orthopnea, LE edema, dizziness, palpitations or syncope. He is compliant with his meds and is tolerating meds with no SE.      Past Medical History:  Diagnosis Date  . Allergic rhinitis   . Anxiety   . Aortic insufficiency   . Aortic stenosis, severe    S/P TAVR  . Arthritis   . BPH (benign prostatic hypertrophy)   . Bruises easily   . Chronic diastolic congestive heart failure ([place])   . Chronic fatigue   . Coronary artery disease    s/p PCI of left cir and LAD 2005 and PCI of prox PDA 7/09  . Dementia ([place])     early per pt- recently evaluated at [facility]- eccho7/13  , MRI head EPIC 6/13  . Dementia, vascular ([place])    Dr [name]  . Essential hypertension, benign   . GERD (gastroesophageal reflux disease)   . HBP (high blood pressure)   . Headache(784.0)    relieved with OTC's  . Hyperlipidemia   . Hypertension   . Incontinence of urine   . Left carotid bruit    w minimal obstruction on doppler  . OAB (overactive bladder)   . Osteoarthritis of left knee   . Pure hypercholesterolemia   . 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 Unsteady gait     Past Surgical History:  Procedure Laterality Date  . CARDIAC CATHETERIZATION     3 stents first cath 2 stents placed then additional stent placed  . CARDIAC CATHETERIZATION N/A 04/19/2015   Procedure: Right/Left Heart Cath and Coronary Angiography;  Surgeon: [name], MD;  Location: [place] [facility];  Service: Cardiovascular;  Laterality: N/A;  .  COLONOSCOPY W/ POLYPECTOMY    . colonscopy     . EYE SURGERY Bilateral    cataract surgery bilat   . HEMORROIDECTOMY    . KNEE ARTHROSCOPY  09/19/2011   Procedure: ARTHROSCOPY KNEE;  Surgeon: [name], MD;  [facility];  Service: Orthopedics;  Laterality: Left;  . ROTATOR CUFF REPAIR Right 3/13   right  . stent placed     x3 per pt  . TEE WITHOUT CARDIOVERSION N/A 05/29/2015   Procedure: TRANSESOPHAGEAL ECHOCARDIOGRAM (TEE);  Surgeon: [name], MD;  Location: [place];  Service: Open Heart Surgery;  Laterality: N/A;  . TONSILLECTOMY    . TOTAL KNEE ARTHROPLASTY Left 02/21/2014   Procedure: LEFT TOTAL KNEE ARTHROPLASTY;  Surgeon: [name], MD;  [facility];  Service: Orthopedics;  Laterality: Left;  . TRANSCATHETER AORTIC VALVE REPLACEMENT, TRANSFEMORAL N/A 05/29/2015   Procedure: TRANSCATHETER AORTIC VALVE REPLACEMENT, TRANSFEMORAL;   Surgeon: [name], MD;  Location: [place];  Service: Open Heart Surgery;  Laterality: N/A;  . TRANSURETHRAL RESECTION OF PROSTATE    . TURP VAPORIZATION      Current Medications: Current Meds  Medication Sig  . acetaminophen (TYLENOL) 325 MG tablet Take 650 mg by mouth every 6 (six) hours as needed for mild pain.  [name] Kitchen aspirin 81 MG tablet Take 81 mg by mouth daily.  . clopidogrel (PLAVIX) 75 MG tablet Take 1 tablet (75 mg total) by mouth daily.  . diclofenac sodium (VOLTAREN) 1 % GEL Apply 2 g topically 4 (four) times daily.  . memantine (NAMENDA) 10 MG tablet Take 20 mg by mouth daily.  . montelukast (SINGULAIR) 10 MG tablet Take 10 mg by mouth daily.  . Multiple Vitamin (MULTIVITAMIN WITH MINERALS) TABS Take 1 tablet by mouth daily.  . nitroGLYCERIN (NITROSTAT) 0.4 MG SL tablet Take 0.4 mg by mouth as needed. Place one tablet under your tongue every 5 minutes as needed for chest pain  . omeprazole (PRILOSEC) 40 MG capsule TAKE 1 CAPSULE EVERY DAY  . potassium chloride SA (KLOR-CON) 20 MEQ tablet Take 2 tablets (40 mEq total) by mouth daily.  . rosuvastatin (CRESTOR) 20 MG tablet TAKE 1 TABLET DAILY (PLEASE KEEP UPCOMING APPOINTMENT IN MAY WITH DR Aris Moman BEFORE ANYMORE REFILLS) (Patient taking differently: Take 20 mg by mouth daily.)  . sertraline (ZOLOFT) 50 MG tablet Take 50 mg by mouth daily.  . [DISCONTINUED] amLODipine (NORVASC) 5 MG tablet Take 1 tablet (5 mg total) by mouth daily.  . [DISCONTINUED] carvedilol (COREG) 12.5 MG tablet TAKE 1 TAB TWICE DAILY WITH A MEAL (PLEASE KEEP UPCOMING APPOINTMENT IN MAY WITH DR Draden Cottingham BEFORE ANYMORE REFILLS)  . [DISCONTINUED] furosemide (LASIX) 20 MG tablet TAKE 1 TABLET DAILY (PLEASE KEEP UPCOMING APPOINTMENT IN MAY WITH DR Yannis Gumbs BEFORE ANYMORE REFILLS)     Allergies:   Gabapentin, Other, Pregabalin, Latex, Sulfa antibiotics, and Sulfasalazine   Social History   Socioeconomic History  . Marital status: Married    Spouse name: Not  on file  . Number of children: 3  . Years of education: Not on file  . Highest education level: Not on file  Occupational History  . Occupation: retired  Tobacco Use  . Smoking status: Former Smoker    Years: 20.00    Types: Cigars    Quit date: 02/10/1970    Years since quitting: 50.2  . Smokeless tobacco: Former Systems developer    Types: Chew  . Tobacco comment: smoked cigars off and on x 20  years  Vaping Use  . Vaping Use: Never used  Substance and Sexual Activity  . Alcohol use: Yes    Alcohol/week: 3.0 standard drinks    Types: 3 Glasses of wine per week    Comment: weekly/    occ beer/wine not recently (05/25/15)  . Drug use: No  . Sexual activity: Not Currently  Other Topics Concern  . Not on file  Social History Narrative  . Not on file   Social Determinants of Health   Financial Resource Strain: Not on file  Food Insecurity: Not on file  Transportation Needs: Not on file  Physical Activity: Not on file  Stress: Not on file  Social Connections: Not on file     Family History: The patient's family history includes Lung cancer in his father. There is no history of Anesthesia problems or Heart attack.  ROS:   Please see the history of present illness.    ROS  All other systems reviewed and negative.   EKGs/Labs/Other Studies Reviewed:    The following studies were reviewed today: none  EKG:  EKG is  ordered today.  The ekg ordered today demonstrates NSR with LBBB  Recent Labs: 01/22/2020: ALT 31; B Natriuretic Peptide 71.4 02/11/2020: BUN 12; Creatinine, Ser 0.77; Hemoglobin 17.1; Platelets 250; Potassium 3.9; Sodium 137   Recent Lipid Panel    Component Value Date/Time   CHOL 141 10/20/2014 1111   TRIG 126.0 10/20/2014 1111   HDL 47.10 10/20/2014 1111   CHOLHDL 3 10/20/2014 1111   VLDL 25.2 10/20/2014 1111   LDLCALC 69 10/20/2014 1111    Physical Exam:    VS:  BP 124/80   Pulse 65   Ht 6\' 3"  (1.905 m)   Wt 234 lb 6.4 oz (106.3 kg)   SpO2 97%   BMI  29.30 kg/m     Wt Readings from Last 3 Encounters:  05/03/20 234 lb 6.4 oz (106.3 kg)  02/11/20 230 lb (104.3 kg)  07/06/19 245 lb (111.1 kg)     GEN: Well nourished, well developed in no acute distress HEENT: Normal NECK: No JVD; No carotid bruits LYMPHATICS: No lymphadenopathy CARDIAC:RRR, no murmurs, rubs, gallops RESPIRATORY:  Clear to auscultation without rales, wheezing or rhonchi  ABDOMEN: Soft, non-tender, non-distended MUSCULOSKELETAL:  No edema; No deformity  SKIN: Warm and dry NEUROLOGIC:  Alert and oriented x 3 PSYCHIATRIC:  Normal affect    ASSESSMENT:    1. Atherosclerosis of native coronary artery of native heart without angina pectoris   2. Nonrheumatic aortic valve stenosis   3. Essential hypertension, benign   4. Chronic diastolic congestive heart failure ([place])   5. Pure hypercholesterolemia   6. LBBB (left bundle branch block)   7. Preoperative clearance    PLAN:    In order of problems listed above:  1.  ASCAD  -s/p PCI of left cir and LAD 2005 and PCI of prox PDA 7/09.   -he denies any anginal symptoms since I saw him last -he did have a 2 weeks episode of chest wall pain after a 5' clock fell on his upper abdomen and was having pain with breathing and pain to touch his chest wall which has resolved -continue ASA, Plavix 75mg  daily, statin and Carvedilol 12.5mg  BID.   2.  Aortic Stenosis  -s/p TAVR on 05/29/15 with 29 mm Edwards Sapien 3 biopro sthetic valve.   -Echo post TAVR showed normal LVF with with mean AVG 53mmHg and no AI.   -2D echo  02/2018 showed a stable TAVR with mean AVG 80mmHg and no perivalvular AI -he knows that he needs to take prophylactic antibx prior to dental procedures -he is 5 yrs out from TAVR and ok to be off Plavix for his teeth extraction  3.  HTN  -Bp is adequately controlled on exam today -continue Carvedilol 12.5mg  BID, Losartan 50mg  daily and amlodipine 5mg  daily -SCr stable on labs from PCP at 0.77 and K+  3.9.  4.  Chronic diastolic CHF  -he does not appear volume overloaded on exam today -continue Lasix 20mg  daily  5.  Hyperlipidemia  -LDL goal is < 70.   -LDL was 70 in April 2021 -continue Crestor 20mg  daily -check FLP and ALT  6. LBBB -he is asymptomatic -his last cath in 2017 showed patent stents -Lexiscan myoview 06/2019 showed no ischemia  7.  Preoperative clearance -he is doing well from a cardiac standpoint with no angina -Lexiscan myoview done less than a year ago showed no ischemia -he is having 4 teeth pulled and a alveoplasty under local anesthesia and NO -He is low risk for surgical procedure and ok to hold Plavix for 7 days for procedure -I have prescribed Amoxicillin 2gm to take 1 hour prior to extraction   Medication Adjustments/Labs and Tests Ordered: Current medicines are reviewed at length with the patient today.  Concerns regarding medicines are outlined above.  No orders of the defined types were placed in this encounter.  Meds ordered this encounter  Medications  . amLODipine (NORVASC) 5 MG tablet    Sig: Take 1 tablet (5 mg total) by mouth daily.    Dispense:  30 tablet    Refill:  7  . carvedilol (COREG) 12.5 MG tablet    Sig: TAKE 1 TAB TWICE DAILY WITH A MEAL    Dispense:  180 tablet    Refill:  3  . furosemide (LASIX) 20 MG tablet    Sig: TAKE 1 TABLET DAILY    Dispense:  90 tablet    Refill:  3    Signed, Fransico Him, MD  05/03/2020 1:40 PM    [facility]

## 2020-05-03 NOTE — Addendum Note (Signed)
Addended by: Antonieta Iba on: 05/03/2020 01:48 PM   Modules accepted: Orders

## 2020-05-07 ENCOUNTER — Other Ambulatory Visit: Payer: Self-pay

## 2020-05-07 ENCOUNTER — Other Ambulatory Visit: Payer: Medicare HMO | Admitting: *Deleted

## 2020-05-07 DIAGNOSIS — E7800 Pure hypercholesterolemia, unspecified: Secondary | ICD-10-CM | POA: Diagnosis not present

## 2020-05-07 LAB — LIPID PANEL
Chol/HDL Ratio: 3.2 ratio (ref 0.0–5.0)
Cholesterol, Total: 125 mg/dL (ref 100–199)
HDL: 39 mg/dL — ABNORMAL LOW (ref >39)
LDL Chol Calc (NIH): 67 mg/dL (ref 0–99)
Triglycerides: 100 mg/dL (ref 0–149)
VLDL Cholesterol Cal: 19 mg/dL (ref 5–40)

## 2020-05-07 LAB — ALT: ALT: 28 IU/L (ref 0–44)

## 2020-05-22 DIAGNOSIS — R8290 Unspecified abnormal findings in urine: Secondary | ICD-10-CM | POA: Diagnosis not present

## 2020-06-06 DIAGNOSIS — K000 Anodontia: Secondary | ICD-10-CM | POA: Diagnosis not present

## 2020-07-12 DIAGNOSIS — R69 Illness, unspecified: Secondary | ICD-10-CM | POA: Diagnosis not present

## 2020-07-29 ENCOUNTER — Other Ambulatory Visit: Payer: Self-pay | Admitting: Cardiology

## 2020-08-30 DIAGNOSIS — M129 Arthropathy, unspecified: Secondary | ICD-10-CM | POA: Diagnosis not present

## 2020-08-30 DIAGNOSIS — M25511 Pain in right shoulder: Secondary | ICD-10-CM | POA: Diagnosis not present

## 2020-08-30 DIAGNOSIS — I110 Hypertensive heart disease with heart failure: Secondary | ICD-10-CM | POA: Diagnosis not present

## 2020-08-30 DIAGNOSIS — F0151 Vascular dementia with behavioral disturbance: Secondary | ICD-10-CM | POA: Diagnosis not present

## 2020-08-30 DIAGNOSIS — E7800 Pure hypercholesterolemia, unspecified: Secondary | ICD-10-CM | POA: Diagnosis not present

## 2020-09-05 ENCOUNTER — Other Ambulatory Visit: Payer: Self-pay | Admitting: Sports Medicine

## 2020-09-05 ENCOUNTER — Ambulatory Visit
Admission: RE | Admit: 2020-09-05 | Discharge: 2020-09-05 | Disposition: A | Discharge status: Home / Self Care | Payer: Medicare HMO | Source: Ambulatory Visit | Attending: Sports Medicine | Admitting: Sports Medicine

## 2020-09-05 ENCOUNTER — Other Ambulatory Visit: Payer: Self-pay

## 2020-09-05 DIAGNOSIS — M25511 Pain in right shoulder: Secondary | ICD-10-CM

## 2020-09-05 DIAGNOSIS — M19011 Primary osteoarthritis, right shoulder: Secondary | ICD-10-CM | POA: Diagnosis not present

## 2020-09-13 ENCOUNTER — Other Ambulatory Visit: Payer: Self-pay | Admitting: *Deleted

## 2020-09-13 ENCOUNTER — Other Ambulatory Visit: Payer: Self-pay | Admitting: Cardiology

## 2020-09-13 MED ORDER — AMLODIPINE BESYLATE 5 MG PO TABS: 5.0000 mg | ORAL_TABLET | Freq: Every day | ORAL | 1 refills | Status: DC

## 2020-11-15 DIAGNOSIS — M19011 Primary osteoarthritis, right shoulder: Secondary | ICD-10-CM | POA: Diagnosis not present

## 2020-12-12 DIAGNOSIS — Z23 Encounter for immunization: Secondary | ICD-10-CM | POA: Diagnosis not present

## 2021-01-10 ENCOUNTER — Other Ambulatory Visit: Payer: Self-pay | Admitting: Cardiology

## 2021-01-23 DIAGNOSIS — F0150 Vascular dementia without behavioral disturbance: Secondary | ICD-10-CM | POA: Diagnosis not present

## 2021-01-23 DIAGNOSIS — Z0000 Encounter for general adult medical examination without abnormal findings: Secondary | ICD-10-CM | POA: Diagnosis not present

## 2021-01-23 DIAGNOSIS — I700 Atherosclerosis of aorta: Secondary | ICD-10-CM | POA: Diagnosis not present

## 2021-01-23 DIAGNOSIS — I25119 Atherosclerotic heart disease of native coronary artery with unspecified angina pectoris: Secondary | ICD-10-CM | POA: Diagnosis not present

## 2021-01-23 DIAGNOSIS — I5030 Unspecified diastolic (congestive) heart failure: Secondary | ICD-10-CM | POA: Diagnosis not present

## 2021-01-23 DIAGNOSIS — E7800 Pure hypercholesterolemia, unspecified: Secondary | ICD-10-CM | POA: Diagnosis not present

## 2021-01-23 DIAGNOSIS — N3281 Overactive bladder: Secondary | ICD-10-CM | POA: Diagnosis not present

## 2021-01-23 DIAGNOSIS — K219 Gastro-esophageal reflux disease without esophagitis: Secondary | ICD-10-CM | POA: Diagnosis not present

## 2021-01-23 DIAGNOSIS — I110 Hypertensive heart disease with heart failure: Secondary | ICD-10-CM | POA: Diagnosis not present

## 2021-02-27 ENCOUNTER — Other Ambulatory Visit: Payer: Self-pay

## 2021-02-27 NOTE — Telephone Encounter (Signed)
[name] is requesting a refill on sertraline. Please address

## 2021-03-04 ENCOUNTER — Other Ambulatory Visit: Payer: Self-pay

## 2021-03-18 DIAGNOSIS — M25511 Pain in right shoulder: Secondary | ICD-10-CM | POA: Diagnosis not present

## 2021-03-21 DIAGNOSIS — R8290 Unspecified abnormal findings in urine: Secondary | ICD-10-CM | POA: Diagnosis not present

## 2021-03-23 ENCOUNTER — Other Ambulatory Visit: Payer: Self-pay | Admitting: Cardiology

## 2021-04-03 DIAGNOSIS — M549 Dorsalgia, unspecified: Secondary | ICD-10-CM | POA: Diagnosis not present

## 2021-04-04 ENCOUNTER — Other Ambulatory Visit: Payer: Self-pay | Admitting: Family Medicine

## 2021-04-04 ENCOUNTER — Ambulatory Visit
Admission: RE | Admit: 2021-04-04 | Discharge: 2021-04-04 | Disposition: A | Discharge status: Home / Self Care | Payer: Medicare HMO | Source: Ambulatory Visit | Attending: Family Medicine | Admitting: Family Medicine

## 2021-04-04 DIAGNOSIS — M549 Dorsalgia, unspecified: Secondary | ICD-10-CM

## 2021-04-04 DIAGNOSIS — M545 Low back pain, unspecified: Secondary | ICD-10-CM | POA: Diagnosis not present

## 2021-04-08 ENCOUNTER — Other Ambulatory Visit: Payer: Self-pay | Admitting: Cardiology

## 2021-04-11 DIAGNOSIS — S32010A Wedge compression fracture of first lumbar vertebra, initial encounter for closed fracture: Secondary | ICD-10-CM | POA: Diagnosis not present

## 2021-04-12 ENCOUNTER — Other Ambulatory Visit: Payer: Self-pay | Admitting: Neurosurgery

## 2021-04-12 DIAGNOSIS — S32010A Wedge compression fracture of first lumbar vertebra, initial encounter for closed fracture: Secondary | ICD-10-CM

## 2021-04-15 ENCOUNTER — Other Ambulatory Visit (HOSPITAL_COMMUNITY): Payer: Self-pay | Admitting: Neurosurgery

## 2021-04-15 ENCOUNTER — Other Ambulatory Visit: Payer: Self-pay | Admitting: Neurosurgery

## 2021-04-15 DIAGNOSIS — S32010A Wedge compression fracture of first lumbar vertebra, initial encounter for closed fracture: Secondary | ICD-10-CM

## 2021-04-26 ENCOUNTER — Ambulatory Visit (HOSPITAL_COMMUNITY)
Admission: RE | Admit: 2021-04-26 | Discharge: 2021-04-26 | Disposition: A | Discharge status: Home / Self Care | Payer: Medicare HMO | Source: Ambulatory Visit | Attending: Neurosurgery | Admitting: Neurosurgery

## 2021-04-26 ENCOUNTER — Other Ambulatory Visit: Payer: Self-pay

## 2021-04-26 DIAGNOSIS — S32030A Wedge compression fracture of third lumbar vertebra, initial encounter for closed fracture: Secondary | ICD-10-CM | POA: Diagnosis not present

## 2021-04-26 DIAGNOSIS — S32010A Wedge compression fracture of first lumbar vertebra, initial encounter for closed fracture: Secondary | ICD-10-CM | POA: Insufficient documentation

## 2021-04-28 ENCOUNTER — Other Ambulatory Visit: Payer: Medicare HMO

## 2021-05-02 DIAGNOSIS — S32030A Wedge compression fracture of third lumbar vertebra, initial encounter for closed fracture: Secondary | ICD-10-CM | POA: Diagnosis not present

## 2021-05-02 DIAGNOSIS — S32010A Wedge compression fracture of first lumbar vertebra, initial encounter for closed fracture: Secondary | ICD-10-CM | POA: Diagnosis not present

## 2021-05-08 ENCOUNTER — Telehealth: Payer: Self-pay | Admitting: Cardiology

## 2021-05-08 NOTE — Telephone Encounter (Signed)
? ?  Name: [name]  ?DOB: 09/14/1933  ?MRN: [id number] ? ?Primary Cardiologist: Fransico Him, MD ? ?Chart reviewed as part of pre-operative protocol coverage. Because of Nagee R Verrilli's past medical history and time since last visit, he will require a follow-up visit in order to better assess preoperative cardiovascular risk. ? ?Pre-op covering staff: ?- Please schedule appointment and call patient to inform them. If patient already had an upcoming appointment within acceptable timeframe, please add "pre-op clearance" to the appointment notes so provider is aware. ?- Please contact requesting surgeon's office via preferred method (i.e, phone, fax) to inform them of need for appointment prior to surgery. ? ?If applicable, this message will also be routed to pharmacy pool and/or primary cardiologist for input on holding anticoagulant/antiplatelet agent as requested below so that this information is available to the clearing provider at time of patient's appointment.  ? ?Almyra Deforest, [place]  ?05/08/2021, 10:56 AM  ? ?

## 2021-05-08 NOTE — Telephone Encounter (Signed)
Patient's wife is calling stating Dr. Marchelle Folks office will be faxing a clearance request today for a fractured spine. She states they are needing clearance on holding medications. Wanted to give the office a heads up so they can be looking out for it.  ?

## 2021-05-08 NOTE — Telephone Encounter (Signed)
Please collect more info in this cardiac clearance. Also patient has not been seen in over a year, will need office follow up for clearance.  ? ?

## 2021-05-08 NOTE — Telephone Encounter (Signed)
? ?  Pre-operative Risk Assessment  ?  ?Patient Name: [name]  ?DOB: Nov 27, 1933 ?MRN: [id number]  ? ?  ? ?Request for Surgical Clearance   ? ?Procedure:   L3 Kyphoplasty ? ?Date of Surgery:  Clearance 05/15/21                              ?   ?Surgeon:  Dr Cooper Render Pool ?Surgeon's Group or Practice Name:  [facility] ?Phone number:  [phone]  ?Fax number:  [phone] Lorriane Shire x-244 ?  ?Type of Clearance Requested:   ?- Pharmacy:  Hold Clopidogrel (Plavix)   ?  ?Type of Anesthesia:  MAC ?  ?Additional requests/questions:   ? ?Signed, ?Bevelyn Buckles L   ?05/08/2021, 12:22 PM  ? ?

## 2021-05-09 ENCOUNTER — Ambulatory Visit: Payer: Medicare HMO | Admitting: Physician Assistant

## 2021-05-09 ENCOUNTER — Encounter: Payer: Self-pay | Admitting: Physician Assistant

## 2021-05-09 VITALS — BP 144/87 | HR 73 | Ht 73.0 in | Wt 204.0 lb

## 2021-05-09 DIAGNOSIS — R634 Abnormal weight loss: Secondary | ICD-10-CM | POA: Diagnosis not present

## 2021-05-09 DIAGNOSIS — Z01818 Encounter for other preprocedural examination: Secondary | ICD-10-CM

## 2021-05-09 DIAGNOSIS — I10 Essential (primary) hypertension: Secondary | ICD-10-CM

## 2021-05-09 DIAGNOSIS — I5032 Chronic diastolic (congestive) heart failure: Secondary | ICD-10-CM | POA: Diagnosis not present

## 2021-05-09 DIAGNOSIS — I2510 Atherosclerotic heart disease of native coronary artery without angina pectoris: Secondary | ICD-10-CM | POA: Diagnosis not present

## 2021-05-09 DIAGNOSIS — E785 Hyperlipidemia, unspecified: Secondary | ICD-10-CM

## 2021-05-09 DIAGNOSIS — Z952 Presence of prosthetic heart valve: Secondary | ICD-10-CM

## 2021-05-09 MED ORDER — ROSUVASTATIN CALCIUM 20 MG PO TABS: 20.0000 mg | ORAL_TABLET | Freq: Every day | ORAL | 3 refills | Status: DC

## 2021-05-09 MED ORDER — AMLODIPINE BESYLATE 5 MG PO TABS: 5.0000 mg | ORAL_TABLET | Freq: Every day | ORAL | 0 refills | Status: DC

## 2021-05-09 NOTE — Progress Notes (Signed)
?Cardiology Office Note:   ? ?Date:  05/09/2021  ? ?ID:  [name], [name], MRN [id number] ? ?PCP:  [name], MD ?  ?[facility] Providers ?Cardiologist:  Fransico Him, MD    ? ?Referring MD: [name], MD  ? ?Chief Complaint  ?Patient presents with  ? Pre-op Exam  ?  Upcoming kyphoplasty  ? ? ?History of Present Illness:   ? ?[name] is a 88 y.o. male with a hx of severe AS s/p TAVR 05/29/2015 with 29 mm Edwards Sapien 3 bioprosthetic valve, CAD s/p PCI of left circumflex and LAD in 2005 and PCI of proximal PDA in July 2009, hypertension, hyperlipidemia and chronic diastolic heart failure.  Patient has been followed by Dr. Radford Pax.  Most recent cardiac catheterization performed on 04/19/2015 prior to his TAVR procedure revealed EF 60 to 65%, 25% mid RCA lesion, evidence of severe aortic stenosis.  Echocardiogram in May 2017 continue to show EF 65 to 70%, grade 1 DD, trivial paravalvular leakage around the TAVR valve, trivial MR, mild TR, PA peak pressure 31 mmHg.  Repeat echocardiogram obtained on 06/04/2016 showed EF 60 to 65%, no regional wall motion abnormality, well-seated TAVR bioprosthetic valve.  Last echocardiogram was obtained in January 2020 which showed EF down to 35 to 40%, moderate LVH, focal basal hypertrophy, grade 1 DD, stable bioprosthetic aortic valve.  When he was seen by Dr. Radford Pax in May 2021, he was noted to have a new left bundle branch block.  Myocardial perfusion imaging obtained on 07/06/2019 showed EF 69%, no wall motion abnormality, LBBB, no evidence of ischemia or infarction, overall low risk study.  Patient was last seen by Dr. Radford Pax in March 2022 at which time he was doing well. ? ?Patient presents today for cardiac clearance prior to kyphoplasty on 05/15/2021.  Patient is limited to a wheelchair.  He denies any recent chest pain worsening dyspnea.  1 the major concern is unexplained 40 pound weight loss.  He is planning to see his PCP regarding this.   Daughter does notice he has been eating less.  Physical exam, he has no significant lower extremity edema, orthopnea or PND.  Given the lack of chest discomfort and reassuring Myoview, he is cleared to proceed with kyphoplasty. ? ? ?Past Medical History:  ?Diagnosis Date  ? Allergic rhinitis   ? Anxiety   ? Aortic insufficiency   ? Aortic stenosis, severe   ? S/P TAVR  ? Arthritis   ? BPH (benign prostatic hypertrophy)   ? Bruises easily   ? Chronic diastolic congestive heart failure ([place])   ? Chronic fatigue   ? Coronary artery disease   ? s/p PCI of left cir and LAD 2005 and PCI of prox PDA 7/09  ? Dementia ([place])   ? early per pt- recently evaluated at [facility]- eccho7/13  , MRI head EPIC 6/13  ? Dementia, vascular ([place])   ? Dr [name]  ? Essential hypertension, benign   ? GERD (gastroesophageal reflux disease)   ? HBP (high blood pressure)   ? Headache(784.0)   ? relieved with OTC's  ? Hyperlipidemia   ? Hypertension   ? Incontinence of urine   ? Left carotid bruit   ? w minimal obstruction on doppler  ? OAB (overactive bladder)   ? Osteoarthritis of left knee   ? Pure hypercholesterolemia   ? 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  ? 29 mm Edwards Sapien 3 transcatheter heart valve placed  via percutaneous right transfemoral approach  ? Unsteady gait   ? ? ?Past Surgical History:  ?Procedure Laterality Date  ? CARDIAC CATHETERIZATION    ? 3 stents first cath 2 stents placed then additional stent placed  ? CARDIAC CATHETERIZATION N/A 04/19/2015  ? Procedure: Right/Left Heart Cath and Coronary Angiography;  Surgeon: [name], MD;  Location: [place] [facility];  Service: Cardiovascular;  Laterality: N/A;  ? COLONOSCOPY W/ POLYPECTOMY    ? colonscopy     ? EYE SURGERY Bilateral   ? cataract surgery bilat   ? HEMORROIDECTOMY    ? KNEE ARTHROSCOPY  09/19/2011  ? Procedure: ARTHROSCOPY KNEE;  Surgeon: [name], MD;  [facility];  Service: Orthopedics;  Laterality: Left;  ? ROTATOR  CUFF REPAIR Right 3/13  ? right  ? stent placed    ? x3 per pt  ? TEE WITHOUT CARDIOVERSION N/A 05/29/2015  ? Procedure: TRANSESOPHAGEAL ECHOCARDIOGRAM (TEE);  Surgeon: [name], MD;  Location: [place];  Service: Open Heart Surgery;  Laterality: N/A;  ? TONSILLECTOMY    ? TOTAL KNEE ARTHROPLASTY Left 02/21/2014  ? Procedure: LEFT TOTAL KNEE ARTHROPLASTY;  Surgeon: [name], MD;  [facility];  Service: Orthopedics;  Laterality: Left;  ? TRANSCATHETER AORTIC VALVE REPLACEMENT, TRANSFEMORAL N/A 05/29/2015  ? Procedure: TRANSCATHETER AORTIC VALVE REPLACEMENT, TRANSFEMORAL;  Surgeon: [name], MD;  Location: [place];  Service: Open Heart Surgery;  Laterality: N/A;  ? TRANSURETHRAL RESECTION OF PROSTATE    ? TURP VAPORIZATION    ? ? ?Current Medications: ?Current Meds  ?Medication Sig  ? acetaminophen (TYLENOL) 325 MG tablet Take 650 mg by mouth every 6 (six) hours as needed for mild pain.  ? amoxicillin (AMOXIL) 500 MG tablet Take 4 tablets (2g) one hour prior to dental procedure.  ? aspirin 81 MG tablet Take 81 mg by mouth daily.  ? carvedilol (COREG) 12.5 MG tablet TAKE 1 TAB TWICE DAILY WITH A MEAL  ? clopidogrel (PLAVIX) 75 MG tablet Take 1 tablet (75 mg total) by mouth daily.  ? diclofenac sodium (VOLTAREN) 1 % GEL Apply 2 g topically 4 (four) times daily.  ? furosemide (LASIX) 20 MG tablet TAKE 1 TABLET DAILY  ? memantine (NAMENDA) 10 MG tablet Take 20 mg by mouth daily.  ? montelukast (SINGULAIR) 10 MG tablet Take 10 mg by mouth daily.  ? Multiple Vitamin (MULTIVITAMIN WITH MINERALS) TABS Take 1 tablet by mouth daily.  ? nitroGLYCERIN (NITROSTAT) 0.4 MG SL tablet Take 0.4 mg by mouth as needed. Place one tablet under your tongue every 5 minutes as needed for chest pain  ? omeprazole (PRILOSEC) 40 MG capsule Take 1 capsule (40 mg total) by mouth daily. Please make yearly appt with Dr. Radford Pax for March 2023 for future refills. Thank you 1st attempt (Patient taking differently: Take  40 mg by mouth 2 (two) times daily. Please make yearly appt with Dr. Radford Pax for March 2023 for future refills. Thank you 1st attempt)  ? potassium chloride SA (KLOR-CON M) 20 MEQ tablet Take 2 tablets (40 mEq total) by mouth daily. Pt needs to make appt. With Cardiologist in order to receive further refills. Thank You.  ? sertraline (ZOLOFT) 50 MG tablet Take 50 mg by mouth daily.  ? [DISCONTINUED] amLODipine (NORVASC) 5 MG tablet Take 1 tablet (5 mg total) by mouth daily. Patient needs appointment for future refills. Please call office at [phone] to schedule appointment. 1st attempt.  ? [DISCONTINUED] rosuvastatin (  CRESTOR) 20 MG tablet TAKE 1 TABLET DAILY (PLEASE KEEP UPCOMING APPOINTMENT IN MAY WITH DR TURNER BEFORE ANYMORE REFILLS) (Patient taking differently: Take 20 mg by mouth daily.)  ?  ? ?Allergies:   Gabapentin, Other, Pregabalin, Latex, Sulfa antibiotics, and Sulfasalazine  ? ?Social History  ? ?Socioeconomic History  ? Marital status: Married  ?  Spouse name: Not on file  ? Number of children: 3  ? Years of education: Not on file  ? Highest education level: Not on file  ?Occupational History  ? Occupation: retired  ?Tobacco Use  ? Smoking status: Former  ?  Types: Cigars  ?  Quit date: 02/10/1970  ?  Years since quitting: 51.2  ? Smokeless tobacco: Former  ?  Types: Chew  ? Tobacco comments:  ?  smoked cigars off and on x 20 years  ?Vaping Use  ? Vaping Use: Never used  ?Substance and Sexual Activity  ? Alcohol use: Yes  ?  Alcohol/week: 3.0 standard drinks  ?  Types: 3 Glasses of wine per week  ?  Comment: weekly/    occ beer/wine not recently (05/25/15)  ? Drug use: No  ? Sexual activity: Not Currently  ?Other Topics Concern  ? Not on file  ?Social History Narrative  ? Not on file  ? ?Social Determinants of Health  ? ?Financial Resource Strain: Not on file  ?Food Insecurity: Not on file  ?Transportation Needs: Not on file  ?Physical Activity: Not on file  ?Stress: Not on file  ?Social Connections:  Not on file  ?  ? ?Family History: ?The patient's family history includes Lung cancer in his father. There is no history of Anesthesia problems or Heart attack. ? ?ROS:   ?Please see the history of present

## 2021-05-09 NOTE — Patient Instructions (Signed)
Medication Instructions:  ?You may INCREASE Amlodipine to 10 mg if the top number of blood pressure is persistently greater than 140. Give office a call if you have to increase Amlodipine  ?*If you need a refill on your cardiac medications before your next appointment, please call your pharmacy* ? ?Lab Work: ?NONE ordered at this time of appointment  ? ?If you have labs (blood work) drawn today and your tests are completely normal, you will receive your results only by: ?MyChart Message (if you have MyChart) OR ?A paper copy in the mail ?If you have any lab test that is abnormal or we need to change your treatment, we will call you to review the results. ? ?Testing/Procedures: ?NONE ordered at this time of appointment  ? ?Follow-Up: ?At [facility], you and your health needs are our priority.  As part of our continuing mission to provide you with exceptional heart care, we have created designated Provider Care Teams.  These Care Teams include your primary Cardiologist (physician) and Advanced Practice Providers (APPs -  Physician Assistants and Nurse Practitioners) who all work together to provide you with the care you need, when you need it. ?  ?Your next appointment:   ?6 month(s) ? ?The format for your next appointment:   ?In Person ? ?Provider:   ?Fransico Him, MD   ? ?Other Instructions ? ? ?

## 2021-05-15 DIAGNOSIS — S32030A Wedge compression fracture of third lumbar vertebra, initial encounter for closed fracture: Secondary | ICD-10-CM | POA: Diagnosis not present

## 2021-05-15 DIAGNOSIS — M8008XA Age-related osteoporosis with current pathological fracture, vertebra(e), initial encounter for fracture: Secondary | ICD-10-CM | POA: Diagnosis not present

## 2021-05-29 DIAGNOSIS — R310 Gross hematuria: Secondary | ICD-10-CM | POA: Diagnosis not present

## 2021-05-29 DIAGNOSIS — S32020A Wedge compression fracture of second lumbar vertebra, initial encounter for closed fracture: Secondary | ICD-10-CM | POA: Diagnosis not present

## 2021-05-29 DIAGNOSIS — N4889 Other specified disorders of penis: Secondary | ICD-10-CM | POA: Diagnosis not present

## 2021-05-29 DIAGNOSIS — I10 Essential (primary) hypertension: Secondary | ICD-10-CM | POA: Diagnosis not present

## 2021-05-29 DIAGNOSIS — R1030 Lower abdominal pain, unspecified: Secondary | ICD-10-CM | POA: Diagnosis not present

## 2021-05-29 DIAGNOSIS — E119 Type 2 diabetes mellitus without complications: Secondary | ICD-10-CM | POA: Diagnosis not present

## 2021-05-29 DIAGNOSIS — E876 Hypokalemia: Secondary | ICD-10-CM | POA: Diagnosis not present

## 2021-05-29 DIAGNOSIS — I350 Nonrheumatic aortic (valve) stenosis: Secondary | ICD-10-CM | POA: Diagnosis not present

## 2021-05-29 DIAGNOSIS — I2510 Atherosclerotic heart disease of native coronary artery without angina pectoris: Secondary | ICD-10-CM | POA: Diagnosis not present

## 2021-05-29 DIAGNOSIS — N400 Enlarged prostate without lower urinary tract symptoms: Secondary | ICD-10-CM | POA: Diagnosis not present

## 2021-05-29 DIAGNOSIS — I5032 Chronic diastolic (congestive) heart failure: Secondary | ICD-10-CM | POA: Diagnosis not present

## 2021-05-29 DIAGNOSIS — F03A0 Unspecified dementia, mild, without behavioral disturbance, psychotic disturbance, mood disturbance, and anxiety: Secondary | ICD-10-CM | POA: Diagnosis not present

## 2021-05-29 DIAGNOSIS — N99820 Postprocedural hemorrhage and hematoma of a genitourinary system organ or structure following a genitourinary system procedure: Secondary | ICD-10-CM | POA: Diagnosis not present

## 2021-05-29 DIAGNOSIS — N3001 Acute cystitis with hematuria: Secondary | ICD-10-CM | POA: Diagnosis not present

## 2021-05-29 DIAGNOSIS — S32030A Wedge compression fracture of third lumbar vertebra, initial encounter for closed fracture: Secondary | ICD-10-CM | POA: Diagnosis not present

## 2021-05-29 DIAGNOSIS — K4020 Bilateral inguinal hernia, without obstruction or gangrene, not specified as recurrent: Secondary | ICD-10-CM | POA: Diagnosis not present

## 2021-05-29 DIAGNOSIS — R319 Hematuria, unspecified: Secondary | ICD-10-CM | POA: Diagnosis not present

## 2021-05-29 DIAGNOSIS — R39198 Other difficulties with micturition: Secondary | ICD-10-CM | POA: Diagnosis not present

## 2021-05-29 DIAGNOSIS — R590 Localized enlarged lymph nodes: Secondary | ICD-10-CM | POA: Diagnosis not present

## 2021-05-29 DIAGNOSIS — I110 Hypertensive heart disease with heart failure: Secondary | ICD-10-CM | POA: Diagnosis not present

## 2021-05-29 DIAGNOSIS — R918 Other nonspecific abnormal finding of lung field: Secondary | ICD-10-CM | POA: Diagnosis not present

## 2021-05-29 DIAGNOSIS — E785 Hyperlipidemia, unspecified: Secondary | ICD-10-CM | POA: Diagnosis not present

## 2021-05-29 DIAGNOSIS — S32010A Wedge compression fracture of first lumbar vertebra, initial encounter for closed fracture: Secondary | ICD-10-CM | POA: Diagnosis not present

## 2021-05-29 DIAGNOSIS — R531 Weakness: Secondary | ICD-10-CM | POA: Diagnosis not present

## 2021-05-29 DIAGNOSIS — N3289 Other specified disorders of bladder: Secondary | ICD-10-CM | POA: Diagnosis not present

## 2021-05-31 DIAGNOSIS — R310 Gross hematuria: Secondary | ICD-10-CM | POA: Diagnosis not present

## 2021-05-31 DIAGNOSIS — E119 Type 2 diabetes mellitus without complications: Secondary | ICD-10-CM | POA: Diagnosis not present

## 2021-05-31 DIAGNOSIS — I10 Essential (primary) hypertension: Secondary | ICD-10-CM | POA: Diagnosis not present

## 2021-05-31 DIAGNOSIS — E785 Hyperlipidemia, unspecified: Secondary | ICD-10-CM | POA: Diagnosis not present

## 2021-06-05 ENCOUNTER — Other Ambulatory Visit: Payer: Self-pay | Admitting: Cardiology

## 2021-06-06 ENCOUNTER — Telehealth: Payer: Self-pay | Admitting: Cardiology

## 2021-06-06 DIAGNOSIS — F03A0 Unspecified dementia, mild, without behavioral disturbance, psychotic disturbance, mood disturbance, and anxiety: Secondary | ICD-10-CM | POA: Diagnosis not present

## 2021-06-06 DIAGNOSIS — I110 Hypertensive heart disease with heart failure: Secondary | ICD-10-CM | POA: Diagnosis not present

## 2021-06-06 DIAGNOSIS — M4856XD Collapsed vertebra, not elsewhere classified, lumbar region, subsequent encounter for fracture with routine healing: Secondary | ICD-10-CM | POA: Diagnosis not present

## 2021-06-06 DIAGNOSIS — I5032 Chronic diastolic (congestive) heart failure: Secondary | ICD-10-CM | POA: Diagnosis not present

## 2021-06-06 DIAGNOSIS — N39498 Other specified urinary incontinence: Secondary | ICD-10-CM | POA: Diagnosis not present

## 2021-06-06 DIAGNOSIS — N390 Urinary tract infection, site not specified: Secondary | ICD-10-CM | POA: Diagnosis not present

## 2021-06-06 DIAGNOSIS — R338 Other retention of urine: Secondary | ICD-10-CM | POA: Diagnosis not present

## 2021-06-06 DIAGNOSIS — N401 Enlarged prostate with lower urinary tract symptoms: Secondary | ICD-10-CM | POA: Diagnosis not present

## 2021-06-06 DIAGNOSIS — I2510 Atherosclerotic heart disease of native coronary artery without angina pectoris: Secondary | ICD-10-CM | POA: Diagnosis not present

## 2021-06-06 NOTE — Telephone Encounter (Signed)
Pt c/o medication issue: ? ?1. Name of Medication: amLODipine (NORVASC) 5 MG tablet ?clopidogrel (PLAVIX) 75 MG tablet ? ? ?2. How are you currently taking this medication (dosage and times per day)? Amlodipine was increased to 10 MG's. Patient no longer taking Plavix.  ? ?3. Are you having a reaction (difficulty breathing--STAT)? No ? ?4. What is your medication issue? Medication changes were made on 04/26 due to patient bleeding. Wife states these changes started 04/26 and are to be continued until 05/01. Reports patient's BP was low today, but she was unsure of the exact number the home health nurse said it was. States she will take his BP again and have it when the nurse calls back. Also states they started him on another medication and she will have the name and how he takes it when the nurse calls back as well.  ?

## 2021-06-06 NOTE — Telephone Encounter (Signed)
Pt spouse reports pt clopidogrel 75 mg PO QD was stopped while in the hospital d/t increased bleeding.  Will restart 06/10/21 per spouse. She also reports amlodipine was increased to 10 mg PO QD.  Pt worried that BP is low.  Last BP 122/66.  Advised pt to check BP 1.5 hours after taking BP meds.  Call in if SBP is consistently below 108 and symptomatic.  She also reports pt was started on Finasteride 5 mg PO QD.  Finasteride added to medication list. Pt request that MD be made aware.  Will route to MD as an FYI.   ?

## 2021-06-14 ENCOUNTER — Other Ambulatory Visit: Payer: Self-pay | Admitting: Cardiology

## 2021-07-02 DIAGNOSIS — N401 Enlarged prostate with lower urinary tract symptoms: Secondary | ICD-10-CM | POA: Diagnosis not present

## 2021-07-02 DIAGNOSIS — R310 Gross hematuria: Secondary | ICD-10-CM | POA: Diagnosis not present

## 2021-07-02 DIAGNOSIS — N138 Other obstructive and reflux uropathy: Secondary | ICD-10-CM | POA: Diagnosis not present

## 2021-07-07 ENCOUNTER — Other Ambulatory Visit: Payer: Self-pay | Admitting: Cardiology

## 2021-07-31 ENCOUNTER — Other Ambulatory Visit: Payer: Self-pay | Admitting: Cardiology

## 2021-08-06 DIAGNOSIS — R8290 Unspecified abnormal findings in urine: Secondary | ICD-10-CM | POA: Diagnosis not present

## 2021-08-06 DIAGNOSIS — I2510 Atherosclerotic heart disease of native coronary artery without angina pectoris: Secondary | ICD-10-CM | POA: Diagnosis not present

## 2021-08-06 DIAGNOSIS — E7800 Pure hypercholesterolemia, unspecified: Secondary | ICD-10-CM | POA: Diagnosis not present

## 2021-08-06 DIAGNOSIS — M8088XA Other osteoporosis with current pathological fracture, vertebra(e), initial encounter for fracture: Secondary | ICD-10-CM | POA: Diagnosis not present

## 2021-08-06 DIAGNOSIS — I110 Hypertensive heart disease with heart failure: Secondary | ICD-10-CM | POA: Diagnosis not present

## 2021-08-06 DIAGNOSIS — I700 Atherosclerosis of aorta: Secondary | ICD-10-CM | POA: Diagnosis not present

## 2021-08-17 ENCOUNTER — Other Ambulatory Visit: Payer: Self-pay | Admitting: Cardiology

## 2021-10-27 NOTE — Progress Notes (Unsigned)
Cardiology Office Note:    Date:  10/28/2021   ID:  [name], DOB 05-29-1933, MRN [id number]  PCP:  [name], MD  Cardiologist:  Fransico Him, MD    Referring MD: [name], MD   Chief Complaint  Patient presents with   Coronary Artery Disease   Hypertension   Aortic Stenosis   Congestive Heart Failure   Hyperlipidemia    History of Present Illness:    [name] is a 88 y.o. male with a hx of severe aortic valve stenosis who is s/p TAVR on 05/29/15 with 29 mm Edwards Sapien 3 bioprosthetic valve. He also has a history of ASCAD s/p PCI of left cir and LAD 2005 and PCI of prox PDA 7/09.  He has HTN, hyperlipidemia and chronic diastolic CHF.    He is here today for followup and is doing well.  He did have an episode of chest pain about 2 months ago when a 5' clock fell on him.  Apparently his BP was running high at that time as well due to his PCP taking him off some BP meds and with the pain of the clock falling on him it went higher.  About 2 weeks after the clock fell, he started having pain in the left upper abdomen and chest and was much worse with movement deep breathing and was sore to touch.  He saw his PCP who felt he had bruised his chest wall.  Since then he went back on his BP meds. The pain in his chest wall resolved over time and he has not had any further episode.  He denies any anginal chest pain.   He denies any SOB, DOE, PND, orthopnea, LE edema, dizziness, palpitations or syncope. He is compliant with his meds and is tolerating meds with no SE.      Past Medical History:  Diagnosis Date   Allergic rhinitis    Anxiety    Aortic insufficiency    Aortic stenosis, severe    S/P TAVR   Arthritis    BPH (benign prostatic hypertrophy)    Bruises easily    Chronic diastolic congestive heart failure (HCC)    Chronic fatigue    Coronary artery disease    s/p PCI of left cir and LAD 2005 and PCI of prox PDA 7/09   Dementia ([place])    early per pt-  recently evaluated at [facility]- eccho7/13  , MRI head EPIC 6/13   Dementia, vascular ([place])    Dr [name]   Essential hypertension, benign    GERD (gastroesophageal reflux disease)    HBP (high blood pressure)    Headache(784.0)    relieved with OTC's   Hyperlipidemia    Hypertension    Incontinence of urine    Left carotid bruit    w minimal obstruction on doppler   OAB (overactive bladder)    Osteoarthritis of left knee    Pure hypercholesterolemia    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   29 mm Edwards Sapien 3 transcatheter heart valve placed via percutaneous right transfemoral approach   Unsteady gait     Past Surgical History:  Procedure Laterality Date   CARDIAC CATHETERIZATION     3 stents first cath 2 stents placed then additional stent placed   CARDIAC CATHETERIZATION N/A 04/19/2015   Procedure: Right/Left Heart Cath and Coronary Angiography;  Surgeon: [name], MD;  Location: [facility];  Service: Cardiovascular;  Laterality: N/A;  COLONOSCOPY W/ POLYPECTOMY     colonscopy      EYE SURGERY Bilateral    cataract surgery bilat    HEMORROIDECTOMY     KNEE ARTHROSCOPY  09/19/2011   Procedure: ARTHROSCOPY KNEE;  Surgeon: [name], MD;  Location: [facility];  Service: Orthopedics;  Laterality: Left;   ROTATOR CUFF REPAIR Right 3/13   right   stent placed     x3 per pt   TEE WITHOUT CARDIOVERSION N/A 05/29/2015   Procedure: TRANSESOPHAGEAL ECHOCARDIOGRAM (TEE);  Surgeon: [name], MD;  Location: [facility];  Service: Open Heart Surgery;  Laterality: N/A;   TONSILLECTOMY     TOTAL KNEE ARTHROPLASTY Left 02/21/2014   Procedure: LEFT TOTAL KNEE ARTHROPLASTY;  Surgeon: [name], MD;  Location: [facility];  Service: Orthopedics;  Laterality: Left;   TRANSCATHETER AORTIC VALVE REPLACEMENT, TRANSFEMORAL N/A 05/29/2015   Procedure: TRANSCATHETER AORTIC VALVE REPLACEMENT, TRANSFEMORAL;  Surgeon: [name], MD;   Location: [facility];  Service: Open Heart Surgery;  Laterality: N/A;   TRANSURETHRAL RESECTION OF PROSTATE     TURP VAPORIZATION      Current Medications: Current Meds  Medication Sig   acetaminophen (TYLENOL) 325 MG tablet Take 650 mg by mouth every 6 (six) hours as needed for mild pain.   amLODipine (NORVASC) 5 MG tablet Take 1 tablet (5 mg total) by mouth daily.   amoxicillin (AMOXIL) 500 MG tablet Take 4 tablets (2g) one hour prior to dental procedure.   aspirin 81 MG tablet Take 81 mg by mouth daily.   carvedilol (COREG) 12.5 MG tablet TAKE 1 TABLET TWICE DAILY WITH MEALS   clopidogrel (PLAVIX) 75 MG tablet Take 1 tablet (75 mg total) by mouth daily.   diclofenac sodium (VOLTAREN) 1 % GEL Apply 2 g topically 4 (four) times daily.   finasteride (PROSCAR) 5 MG tablet Take 5 mg by mouth daily.   furosemide (LASIX) 20 MG tablet TAKE 1 TABLET EVERY DAY   memantine (NAMENDA) 10 MG tablet Take 20 mg by mouth daily.   montelukast (SINGULAIR) 10 MG tablet Take 10 mg by mouth daily.   Multiple Vitamin (MULTIVITAMIN WITH MINERALS) TABS Take 1 tablet by mouth daily.   nitroGLYCERIN (NITROSTAT) 0.4 MG SL tablet Take 0.4 mg by mouth as needed. Place one tablet under your tongue every 5 minutes as needed for chest pain   omeprazole (PRILOSEC) 40 MG capsule Take 1 capsule (40 mg total) by mouth daily. Keep appointment for future refills.   potassium chloride SA (KLOR-CON M) 20 MEQ tablet Take 2 tablets (40 mEq total) by mouth daily. Keep follow up appointment to receive further refills. Thanks.   rosuvastatin (CRESTOR) 20 MG tablet Take 1 tablet (20 mg total) by mouth daily.   sertraline (ZOLOFT) 50 MG tablet Take 50 mg by mouth daily.     Allergies:   Gabapentin, Other, Pregabalin, Latex, Sulfa antibiotics, and Sulfasalazine   Social History   Socioeconomic History   Marital status: Married    Spouse name: Not on file   Number of children: 3   Years of education: Not on file   Highest education  level: Not on file  Occupational History   Occupation: retired  Tobacco Use   Smoking status: Former    Types: Cigars    Quit date: 02/10/1970    Years since quitting: 51.7   Smokeless tobacco: Former    Types: Chew   Tobacco comments:    smoked cigars off and on  x 20 years  Vaping Use   Vaping Use: Never used  Substance and Sexual Activity   Alcohol use: Yes    Alcohol/week: 3.0 standard drinks of alcohol    Types: 3 Glasses of wine per week    Comment: weekly/    occ beer/wine not recently (05/25/15)   Drug use: No   Sexual activity: Not Currently  Other Topics Concern   Not on file  Social History Narrative   Not on file   Social Determinants of Health   Financial Resource Strain: Not on file  Food Insecurity: Not on file  Transportation Needs: Not on file  Physical Activity: Not on file  Stress: Not on file  Social Connections: Not on file     Family History: The patient's family history includes Lung cancer in his father. There is no history of Anesthesia problems or Heart attack.  ROS:   Please see the history of present illness.    ROS  All other systems reviewed and negative.   EKGs/Labs/Other Studies Reviewed:    The following studies were reviewed today: none  EKG:  EKG is not ordered today  Recent Labs: No results found for requested labs within last 365 days.   Recent Lipid Panel    Component Value Date/Time   CHOL 125 05/07/2020 0924   TRIG 100 05/07/2020 0924   HDL 39 (L) 05/07/2020 0924   CHOLHDL 3.2 05/07/2020 0924   CHOLHDL 3 10/20/2014 1111   VLDL 25.2 10/20/2014 1111   LDLCALC 67 05/07/2020 0924    Physical Exam:    VS:  BP 132/84   Pulse 61   Ht '6\' 2"'$  (1.88 m)   Wt 204 lb (92.5 kg)   SpO2 96%   BMI 26.19 kg/m     Wt Readings from Last 3 Encounters:  10/28/21 204 lb (92.5 kg)  05/09/21 204 lb (92.5 kg)  05/03/20 234 lb 6.4 oz (106.3 kg)     GEN: Well nourished, well developed in no acute distress HEENT: Normal NECK:  No JVD; No carotid bruits LYMPHATICS: No lymphadenopathy CARDIAC:RRR, no rubs, gallops.  2/6 SM at RUSB RESPIRATORY:  Clear to auscultation without rales, wheezing or rhonchi  ABDOMEN: Soft, non-tender, non-distended MUSCULOSKELETAL:  No edema; No deformity  SKIN: Warm and dry NEUROLOGIC:  Alert and oriented x 3 PSYCHIATRIC:  Normal affect    ASSESSMENT:    1. Coronary artery disease involving native coronary artery of native heart without angina pectoris   2. S/P TAVR (transcatheter aortic valve replacement)   3. Primary hypertension   4. Chronic diastolic congestive heart failure ([place])   5. Hyperlipidemia LDL goal <70     PLAN:    In order of problems listed above:  1.  ASCAD  -s/p PCI of left cir and LAD 2005 and PCI of prox PDA 7/09.   -he denies any anginal symptoms -continue ASA, Plavix '75mg'$  daily, statin and Carvedilol 12.'5mg'$  BID.   2.  Aortic Stenosis  -s/p TAVR on 05/29/15 with 29 mm Edwards Sapien 3 biopro sthetic valve.   -Echo post TAVR showed normal LVF with with mean AVG 87mHg and no AI.   -2D echo 02/2018 showed a stable TAVR with mean AVG 272mg and no perivalvular AI -he knows that he needs to take prophylactic antibx prior to dental procedures -repeat 2D echo since it has been 5 years since his TAVR  3.  HTN  -BP controlled on exam today -continue prescription drug management with Carvedilol 12.'5mg'$   BID and Amlodipine '5mg'$  daily with PRN refills -check BMET today  4.  Chronic diastolic CHF  -he appears euvolemic on exam today -continue prescription drug management with Lasix '20mg'$  daily with PRN refills  5.  Hyperlipidemia  -LDL goal is < 70.   -continue prescription drug management with Crestor '20mg'$  daily with PRN refills -check FLP and ALT  6. LBBB -he remains asymptomatic -his last cath in 2017 showed patent stents -Leane Call 06/2019 showed no ischemia    Medication Adjustments/Labs and Tests Ordered: Current medicines are reviewed at  length with the patient today.  Concerns regarding medicines are outlined above.  No orders of the defined types were placed in this encounter.  No orders of the defined types were placed in this encounter.   Signed, Fransico Him, MD  10/28/2021 9:33 AM    [place]

## 2021-10-28 ENCOUNTER — Ambulatory Visit: Payer: Medicare HMO | Attending: Cardiology | Admitting: Cardiology

## 2021-10-28 ENCOUNTER — Encounter: Payer: Self-pay | Admitting: Cardiology

## 2021-10-28 VITALS — BP 132/84 | HR 61 | Ht 74.0 in | Wt 204.0 lb

## 2021-10-28 DIAGNOSIS — E785 Hyperlipidemia, unspecified: Secondary | ICD-10-CM

## 2021-10-28 DIAGNOSIS — I2510 Atherosclerotic heart disease of native coronary artery without angina pectoris: Secondary | ICD-10-CM | POA: Diagnosis not present

## 2021-10-28 DIAGNOSIS — I5032 Chronic diastolic (congestive) heart failure: Secondary | ICD-10-CM

## 2021-10-28 DIAGNOSIS — I10 Essential (primary) hypertension: Secondary | ICD-10-CM | POA: Diagnosis not present

## 2021-10-28 DIAGNOSIS — Z952 Presence of prosthetic heart valve: Secondary | ICD-10-CM | POA: Diagnosis not present

## 2021-10-28 LAB — COMPREHENSIVE METABOLIC PANEL
ALT: 20 IU/L (ref 0–44)
AST: 18 IU/L (ref 0–40)
Albumin/Globulin Ratio: 1.6 (ref 1.2–2.2)
Albumin: 4 g/dL (ref 3.7–4.7)
Alkaline Phosphatase: 135 IU/L — ABNORMAL HIGH (ref 44–121)
BUN/Creatinine Ratio: 10 (ref 10–24)
BUN: 8 mg/dL (ref 8–27)
Bilirubin Total: 0.9 mg/dL (ref 0.0–1.2)
CO2: 21 mmol/L (ref 20–29)
Calcium: 9.4 mg/dL (ref 8.6–10.2)
Chloride: 104 mmol/L (ref 96–106)
Creatinine, Ser: 0.8 mg/dL (ref 0.76–1.27)
Globulin, Total: 2.5 g/dL (ref 1.5–4.5)
Glucose: 117 mg/dL — ABNORMAL HIGH (ref 70–99)
Potassium: 4.1 mmol/L (ref 3.5–5.2)
Sodium: 139 mmol/L (ref 134–144)
Total Protein: 6.5 g/dL (ref 6.0–8.5)
eGFR: 86 mL/min/{1.73_m2} (ref >59)

## 2021-10-28 LAB — LIPID PANEL
Chol/HDL Ratio: 3.8 ratio (ref 0.0–5.0)
Cholesterol, Total: 158 mg/dL (ref 100–199)
HDL: 42 mg/dL (ref >39)
LDL Chol Calc (NIH): 91 mg/dL (ref 0–99)
Triglycerides: 141 mg/dL (ref 0–149)
VLDL Cholesterol Cal: 25 mg/dL (ref 5–40)

## 2021-10-28 NOTE — Patient Instructions (Signed)
Medication Instructions:  Your physician recommends that you continue on your current medications as directed. Please refer to the Current Medication list given to you today.  *If you need a refill on your cardiac medications before your next appointment, please call your pharmacy*  Lab Work: TODAY: CMET and FLP If you have labs (blood work) drawn today and your tests are completely normal, you will receive your results only by: [place] (if you have MyChart) OR A paper copy in the mail If you have any lab test that is abnormal or we need to change your treatment, we will call you to review the results.  Testing/Procedures: Your physician has requested that you have an echocardiogram. Echocardiography is a painless test that uses sound waves to create images of your heart. It provides your doctor with information about the size and shape of your heart and how well your heart's chambers and valves are working. This procedure takes approximately one hour. There are no restrictions for this procedure.  Follow-Up: At [facility], you and your health needs are our priority.  As part of our continuing mission to provide you with exceptional heart care, we have created designated Provider Care Teams.  These Care Teams include your primary Cardiologist (physician) and Advanced Practice Providers (APPs -  Physician Assistants and Nurse Practitioners) who all work together to provide you with the care you need, when you need it.  Your next appointment:   1 year(s)  The format for your next appointment:   In Person  Provider:   Fransico Him, MD     Important Information About Sugar

## 2021-10-28 NOTE — Addendum Note (Signed)
Addended by: Antonieta Iba on: 10/28/2021 09:35 AM   Modules accepted: Orders

## 2021-10-29 ENCOUNTER — Telehealth: Payer: Self-pay

## 2021-10-29 DIAGNOSIS — I2510 Atherosclerotic heart disease of native coronary artery without angina pectoris: Secondary | ICD-10-CM

## 2021-10-29 DIAGNOSIS — E785 Hyperlipidemia, unspecified: Secondary | ICD-10-CM

## 2021-10-29 MED ORDER — ROSUVASTATIN CALCIUM 40 MG PO TABS: 40.0000 mg | ORAL_TABLET | Freq: Every day | ORAL | 3 refills | Status: AC

## 2021-10-29 NOTE — Telephone Encounter (Signed)
-----  Message from Sueanne Margarita, MD sent at 10/28/2021  5:44 PM EDT ----- Increase Crestor to $RemoveBe'40mg'PUlghHLan$  daily due to LDL goal < 70 and repeat FLP and ALT In 6 weeks.  FOrward to PCP for elevated Alk phos

## 2021-10-29 NOTE — Telephone Encounter (Signed)
The patient's wife has been notified of the result and verbalized understanding.  All questions (if any) were answered. Antonieta Iba, RN 10/29/2021 9:42 AM  Rx has been sent in. Labs have been scheduled.

## 2021-11-12 ENCOUNTER — Ambulatory Visit (HOSPITAL_COMMUNITY): Payer: Medicare HMO

## 2021-11-28 ENCOUNTER — Other Ambulatory Visit (HOSPITAL_COMMUNITY): Payer: Medicare HMO

## 2021-12-04 ENCOUNTER — Other Ambulatory Visit: Payer: Self-pay | Admitting: Cardiology

## 2021-12-06 ENCOUNTER — Other Ambulatory Visit (HOSPITAL_COMMUNITY): Payer: Medicare HMO

## 2021-12-10 ENCOUNTER — Other Ambulatory Visit (HOSPITAL_COMMUNITY): Payer: Medicare HMO

## 2021-12-14 DIAGNOSIS — Z23 Encounter for immunization: Secondary | ICD-10-CM | POA: Diagnosis not present

## 2021-12-20 ENCOUNTER — Ambulatory Visit (HOSPITAL_COMMUNITY): Payer: Medicare HMO | Attending: Cardiology

## 2021-12-20 DIAGNOSIS — I10 Essential (primary) hypertension: Secondary | ICD-10-CM | POA: Insufficient documentation

## 2021-12-20 DIAGNOSIS — E785 Hyperlipidemia, unspecified: Secondary | ICD-10-CM | POA: Insufficient documentation

## 2021-12-20 DIAGNOSIS — I2510 Atherosclerotic heart disease of native coronary artery without angina pectoris: Secondary | ICD-10-CM | POA: Insufficient documentation

## 2021-12-20 DIAGNOSIS — Z952 Presence of prosthetic heart valve: Secondary | ICD-10-CM | POA: Insufficient documentation

## 2021-12-20 DIAGNOSIS — I5032 Chronic diastolic (congestive) heart failure: Secondary | ICD-10-CM | POA: Insufficient documentation

## 2021-12-20 LAB — ECHOCARDIOGRAM COMPLETE
AR max vel: 2.41 cm2
AV Area VTI: 2.29 cm2
AV Area mean vel: 2.3 cm2
AV Mean grad: 18 mmHg
AV Peak grad: 32.2 mmHg
Ao pk vel: 2.84 m/s
Area-P 1/2: 2.44 cm2
S' Lateral: 3.2 cm

## 2021-12-23 ENCOUNTER — Other Ambulatory Visit: Payer: Self-pay

## 2021-12-23 ENCOUNTER — Other Ambulatory Visit: Payer: Medicare HMO

## 2021-12-23 DIAGNOSIS — Z952 Presence of prosthetic heart valve: Secondary | ICD-10-CM

## 2021-12-25 ENCOUNTER — Telehealth: Payer: Self-pay | Admitting: Cardiology

## 2021-12-25 NOTE — Telephone Encounter (Signed)
Martin Margarita, MD 12/22/2021  8:41 AM EST     Echo showed severely thickened heart muscle in the septum with mildly reduced LVF EF 45-50% and stable TAVR.  His ascending aorta has increased in size from 3.9cm to 4.5cm.  COmpared to prior study from 2020, EF has improved and AV gradient is stable.    Patient's wife (DPR) verbalized understanding and had no other questions at this time.

## 2021-12-25 NOTE — Telephone Encounter (Signed)
  pt's wife returning call to get echo result

## 2021-12-27 ENCOUNTER — Other Ambulatory Visit: Payer: Self-pay

## 2021-12-27 DIAGNOSIS — Z952 Presence of prosthetic heart valve: Secondary | ICD-10-CM

## 2021-12-27 NOTE — Progress Notes (Signed)
Chest CTA ordered - required prior to appointment with [facility]CTS

## 2021-12-30 ENCOUNTER — Ambulatory Visit: Payer: Medicare HMO | Attending: Cardiology

## 2021-12-30 DIAGNOSIS — E785 Hyperlipidemia, unspecified: Secondary | ICD-10-CM

## 2021-12-30 DIAGNOSIS — I2510 Atherosclerotic heart disease of native coronary artery without angina pectoris: Secondary | ICD-10-CM | POA: Diagnosis not present

## 2021-12-31 LAB — LIPID PANEL
Chol/HDL Ratio: 2.7 ratio (ref 0.0–5.0)
Cholesterol, Total: 136 mg/dL (ref 100–199)
HDL: 50 mg/dL (ref >39)
LDL Chol Calc (NIH): 66 mg/dL (ref 0–99)
Triglycerides: 108 mg/dL (ref 0–149)
VLDL Cholesterol Cal: 20 mg/dL (ref 5–40)

## 2021-12-31 LAB — ALT: ALT: 19 IU/L (ref 0–44)

## 2022-01-02 IMAGING — DX DG CHEST 2V
2 series · 2 of 2 positions shown · non-contrast
Comparison: Radiograph 01/04/2018, CT 05/09/2015

CLINICAL DATA: Chest pain for 2-3 days, sternal pain for 3 days,
fall 3 days prior

EXAM:
CHEST - 2 VIEW

[chest ap]
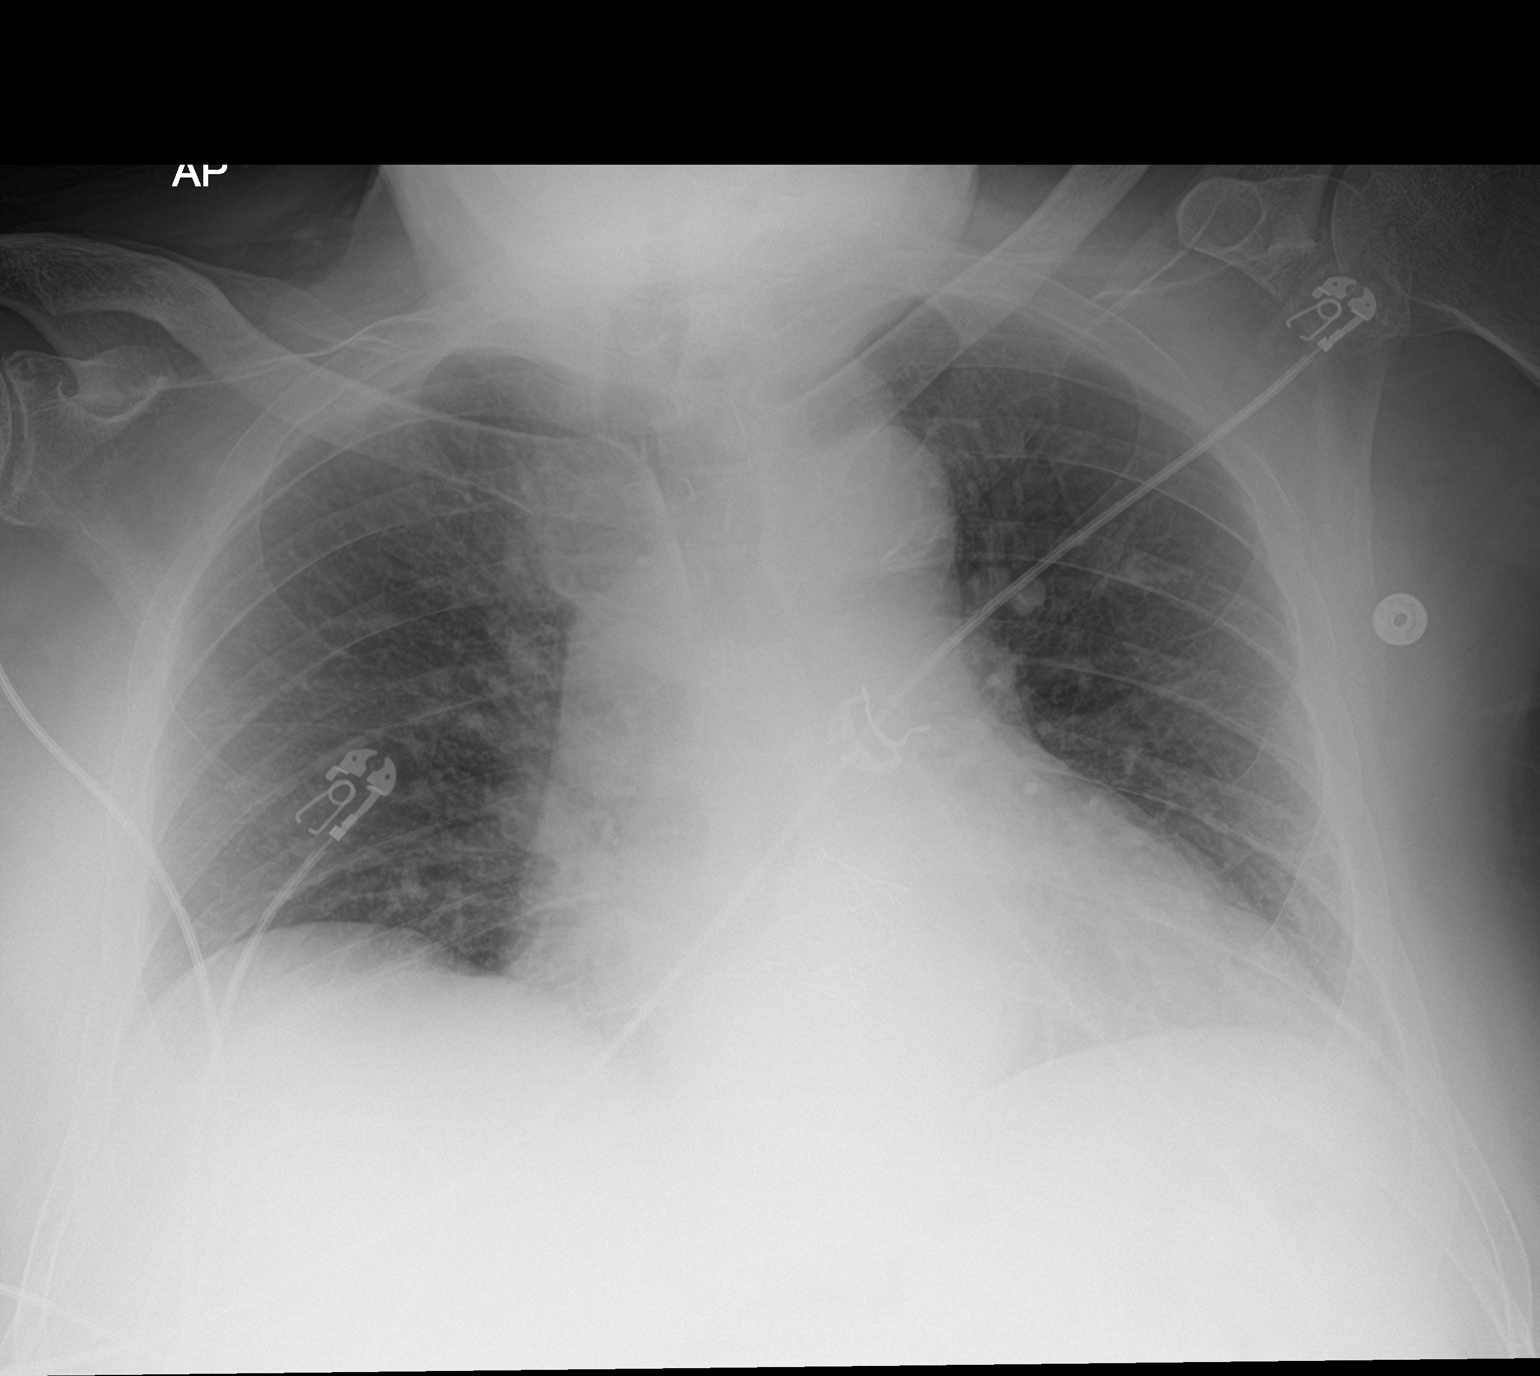

[chest lat]
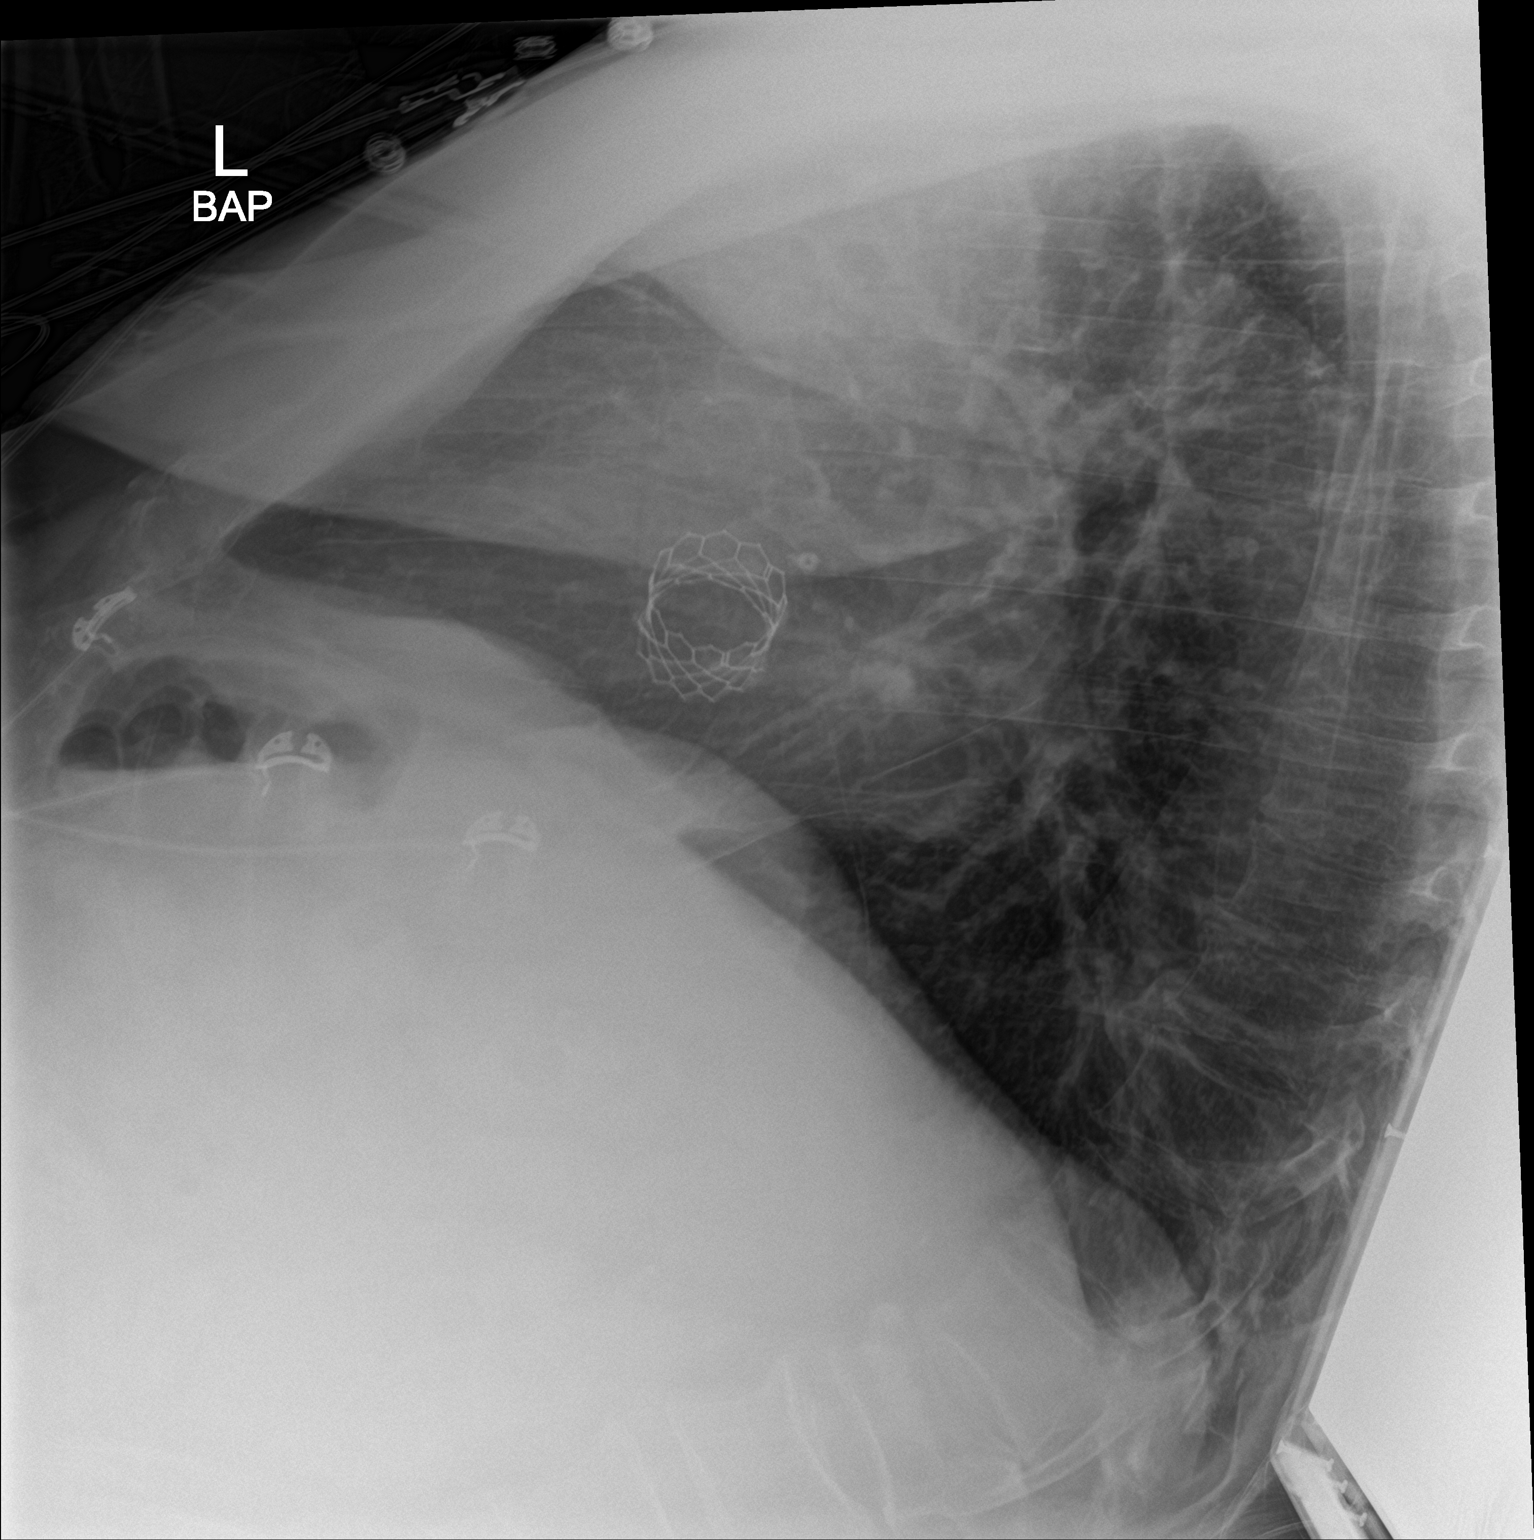

[2 of 2 positions shown; findings below may reference images not displayed]

FINDINGS: Cardiomegaly is similar to priors with evidence of prior
transcatheter aortic valve replacement which is in stable
positioning. The aorta is calcified. The remaining cardiomediastinal
contours are unremarkable. Lung volumes are low with some increased
hazy opacities towards the mid to lower lungs. Superimposed hazy
interstitial opacities with central vascular congestion and
redistribution as well as fissural and septal thickening could
reflect some mild interstitial edema as well. No pneumothorax. No
visible effusion. No focal consolidative opacity. No acute osseous
or soft tissue abnormality. Degenerative changes are present in the
imaged spine and shoulders. Telemetry leads overlie the chest.
IMPRESSION: 1. Low lung volumes with some increased hazy opacities towards the
mid to lower lungs. Findings may reflect a combination of
atelectasis and/or edema.
2. Cardiomegaly.
3. Prior TAVR.
4.  Aortic Atherosclerosis ([id number]).

## 2022-01-13 ENCOUNTER — Other Ambulatory Visit: Payer: Self-pay | Admitting: Cardiology

## 2022-01-24 DIAGNOSIS — E7800 Pure hypercholesterolemia, unspecified: Secondary | ICD-10-CM | POA: Diagnosis not present

## 2022-01-24 DIAGNOSIS — I700 Atherosclerosis of aorta: Secondary | ICD-10-CM | POA: Diagnosis not present

## 2022-01-24 DIAGNOSIS — M150 Primary generalized (osteo)arthritis: Secondary | ICD-10-CM | POA: Diagnosis not present

## 2022-01-24 DIAGNOSIS — K219 Gastro-esophageal reflux disease without esophagitis: Secondary | ICD-10-CM | POA: Diagnosis not present

## 2022-01-24 DIAGNOSIS — I5030 Unspecified diastolic (congestive) heart failure: Secondary | ICD-10-CM | POA: Diagnosis not present

## 2022-01-24 DIAGNOSIS — N3281 Overactive bladder: Secondary | ICD-10-CM | POA: Diagnosis not present

## 2022-01-24 DIAGNOSIS — Z0000 Encounter for general adult medical examination without abnormal findings: Secondary | ICD-10-CM | POA: Diagnosis not present

## 2022-01-24 DIAGNOSIS — I25119 Atherosclerotic heart disease of native coronary artery with unspecified angina pectoris: Secondary | ICD-10-CM | POA: Diagnosis not present

## 2022-01-24 DIAGNOSIS — I110 Hypertensive heart disease with heart failure: Secondary | ICD-10-CM | POA: Diagnosis not present

## 2022-01-29 ENCOUNTER — Ambulatory Visit: Payer: Medicare HMO | Admitting: Surgery

## 2022-01-29 ENCOUNTER — Encounter: Payer: Self-pay | Admitting: Surgery

## 2022-01-29 VITALS — BP 167/87 | HR 59 | Resp 20 | Ht 74.0 in | Wt 192.0 lb

## 2022-01-29 DIAGNOSIS — Z952 Presence of prosthetic heart valve: Secondary | ICD-10-CM

## 2022-01-29 DIAGNOSIS — I7121 Aneurysm of the ascending aorta, without rupture: Secondary | ICD-10-CM | POA: Diagnosis not present

## 2022-01-29 NOTE — Progress Notes (Signed)
Cardiothoracic Surgery Consultation  PCP is [name], MD Referring Provider is Sueanne Margarita, MD  Chief Complaint  Patient presents with   Thoracic Aortic Aneurysm    ECHO 11/10    HPI:  The patient is an 88 year old gentleman with a history of some dementia, hypertension, hyperlipidemia, coronary artery disease status post prior PCI's, severe osteoarthritis of his knees with poor mobility, who underwent TAVR using a 29 mm SAPIEN 3 valve on 05/29/2015 by Dr. Roxy Manns and Dr. Angelena Form.  He has been followed by Dr. Radford Pax.  He had a 2D echocardiogram on 12/20/2021 which showed an ejection fraction of 45 to 50% with a normally functioning TAVR valve with a mean gradient of 18 mmHg.  The ascending aorta was measured at 4.5 cm.  This was compared to prior echo on 02/22/2018 and the ascending aortic measurement at that time was 3.9 cm.  He was referred to me to evaluate his ascending aortic aneurysm.  He is here today with his wife.  He is poorly mobile and has difficulty getting out of the house.  He can walk short distances in the house with a walker and some help but uses a wheelchair when out of his house.  His wife reports that he has lost about 50 pounds over the past year.  He denies any chest pain or pressure.  He denies any shortness of breath.  Past Medical History:  Diagnosis Date   Allergic rhinitis    Anxiety    Aortic insufficiency    Aortic stenosis, severe    S/P TAVR   Arthritis    BPH (benign prostatic hypertrophy)    Bruises easily    Chronic diastolic congestive heart failure (HCC)    Chronic fatigue    Coronary artery disease    s/p PCI of left cir and LAD 2005 and PCI of prox PDA 7/09   Dementia ([place])    early per pt- recently evaluated at [facility]- eccho7/13  , MRI head EPIC 6/13   Dementia, vascular ([place])    Dr [name]   Essential hypertension, benign    GERD (gastroesophageal reflux disease)    HBP (high blood pressure)    Headache(784.0)    relieved  with OTC's   Hyperlipidemia    Hypertension    Incontinence of urine    Left carotid bruit    w minimal obstruction on doppler   OAB (overactive bladder)    Osteoarthritis of left knee    Pure hypercholesterolemia    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   29 mm Edwards Sapien 3 transcatheter heart valve placed via percutaneous right transfemoral approach   Unsteady gait     Past Surgical History:  Procedure Laterality Date   CARDIAC CATHETERIZATION     3 stents first cath 2 stents placed then additional stent placed   CARDIAC CATHETERIZATION N/A 04/19/2015   Procedure: Right/Left Heart Cath and Coronary Angiography;  Surgeon: [name], MD;  Location: [place] [facility];  Service: Cardiovascular;  Laterality: N/A;   COLONOSCOPY W/ POLYPECTOMY     colonscopy      EYE SURGERY Bilateral    cataract surgery bilat    HEMORROIDECTOMY     KNEE ARTHROSCOPY  09/19/2011   Procedure: ARTHROSCOPY KNEE;  Surgeon: [name], MD;  Location: [facility];  Service: Orthopedics;  Laterality: Left;   ROTATOR CUFF REPAIR Right 3/13   right   stent placed     x3 per pt  TEE WITHOUT CARDIOVERSION N/A 05/29/2015   Procedure: TRANSESOPHAGEAL ECHOCARDIOGRAM (TEE);  Surgeon: [name], MD;  Location: [place];  Service: Open Heart Surgery;  Laterality: N/A;   TONSILLECTOMY     TOTAL KNEE ARTHROPLASTY Left 02/21/2014   Procedure: LEFT TOTAL KNEE ARTHROPLASTY;  Surgeon: [name], MD;  Location: [facility];  Service: Orthopedics;  Laterality: Left;   TRANSCATHETER AORTIC VALVE REPLACEMENT, TRANSFEMORAL N/A 05/29/2015   Procedure: TRANSCATHETER AORTIC VALVE REPLACEMENT, TRANSFEMORAL;  Surgeon: [name], MD;  Location: [place];  Service: Open Heart Surgery;  Laterality: N/A;   TRANSURETHRAL RESECTION OF PROSTATE     TURP VAPORIZATION      Family History  Problem Relation Age of Onset   Lung cancer Father    Anesthesia problems Neg Hx    Heart attack  Neg Hx     Social History Social History   Tobacco Use   Smoking status: Former    Types: Cigars    Quit date: 02/10/1970    Years since quitting: 52.0   Smokeless tobacco: Former    Types: Chew   Tobacco comments:    smoked cigars off and on x 20 years  Vaping Use   Vaping Use: Never used  Substance Use Topics   Alcohol use: Yes    Alcohol/week: 3.0 standard drinks of alcohol    Types: 3 Glasses of wine per week    Comment: weekly/    occ beer/wine not recently (05/25/15)   Drug use: No    Current Outpatient Medications  Medication Sig Dispense Refill   acetaminophen (TYLENOL) 325 MG tablet Take 650 mg by mouth every 6 (six) hours as needed for mild pain.     amLODipine (NORVASC) 5 MG tablet Take 1 tablet (5 mg total) by mouth daily. 90 tablet 2   aspirin 81 MG tablet Take 81 mg by mouth daily.     carvedilol (COREG) 12.5 MG tablet TAKE 1 TABLET TWICE DAILY WITH MEALS 180 tablet 3   clopidogrel (PLAVIX) 75 MG tablet Take 1 tablet (75 mg total) by mouth daily. 90 tablet 3   diclofenac sodium (VOLTAREN) 1 % GEL Apply 2 g topically 4 (four) times daily. 1 Tube 1   finasteride (PROSCAR) 5 MG tablet Take 5 mg by mouth daily.     furosemide (LASIX) 20 MG tablet TAKE 1 TABLET EVERY DAY 90 tablet 3   memantine (NAMENDA) 10 MG tablet Take 20 mg by mouth daily.     montelukast (SINGULAIR) 10 MG tablet Take 10 mg by mouth daily.     Multiple Vitamin (MULTIVITAMIN WITH MINERALS) TABS Take 1 tablet by mouth daily.     nitroGLYCERIN (NITROSTAT) 0.4 MG SL tablet Take 0.4 mg by mouth as needed. Place one tablet under your tongue every 5 minutes as needed for chest pain     omeprazole (PRILOSEC) 40 MG capsule Take 1 capsule (40 mg total) by mouth daily. 90 capsule 3   potassium chloride SA (KLOR-CON M) 20 MEQ tablet Take 2 tablets (40 mEq total) by mouth daily. 180 tablet 2   rosuvastatin (CRESTOR) 40 MG tablet Take 1 tablet (40 mg total) by mouth daily. 90 tablet 3   sertraline (ZOLOFT) 50 MG  tablet Take 50 mg by mouth daily.     amoxicillin (AMOXIL) 500 MG tablet Take 4 tablets (2g) one hour prior to dental procedure. (Patient not taking: Reported on 01/29/2022) 4 tablet 11   No current facility-administered medications for this visit.  Allergies  Allergen Reactions   Gabapentin Other (See Comments)   Other Other (See Comments)   Pregabalin Other (See Comments)   Latex Rash and Other (See Comments)   Sulfa Antibiotics Hives and Rash   Sulfasalazine Hives and Rash    Review of Systems  Constitutional:  Positive for appetite change and unexpected weight change. Negative for fatigue.  HENT: Negative.    Eyes: Negative.   Respiratory:  Negative for shortness of breath.   Cardiovascular:  Negative for chest pain, palpitations and leg swelling.  Gastrointestinal: Negative.   Neurological:  Negative for dizziness and syncope.       Positive dementia    BP (!) 167/87 (BP Location: Left Arm, Patient Position: Sitting)   Pulse (!) 59   Resp 20   Ht '6\' 2"'$  (1.88 m)   Wt 192 lb (87.1 kg)   SpO2 94% Comment: RA  BMI 24.65 kg/m  Physical Exam Constitutional:      Comments: Slowed mentation Frail-appearing elderly gentleman in no distress in a wheelchair.  Cardiovascular:     Rate and Rhythm: Normal rate and regular rhythm.     Heart sounds: Normal heart sounds. No murmur heard. Pulmonary:     Effort: Pulmonary effort is normal.     Breath sounds: Normal breath sounds.  Musculoskeletal:        General: No swelling.      Diagnostic Tests:     ECHOCARDIOGRAM REPORT       Patient Name:   CRESPIN FORSTROM Date of Exam: 12/20/2021  Medical Rec #:  [id number]       Height:       74.0 in  Accession #:    [id number]      Weight:       204.0 lb  Date of Birth:  18-Dec-1933       BSA:          2.192 m  Patient Age:    67 years        BP:           132/84 mmHg  Patient Gender: M               HR:           59 bpm.  Exam Location:  [place]   Procedure: 2D Echo,  Cardiac Doppler and Color Doppler   Indications:    I25.1 CAD    History:        Patient has prior history of Echocardiogram examinations,  most                 recent 02/22/2018. CAD; Risk Factors:Hypertension,  Dyslipidemia                 and Former Smoker. Disatolic heart failure.                  Aortic Valve: 29 mm Sapien prosthetic, stented (TAVR)  valve is                  present in the aortic position. Procedure Date: 05/29/15.    Sonographer:    Basilia Jumbo BS, RDCS  Referring Phys: [place]     1. Severe hypertrophy of the basal septum with otherwise mild concentric  LVH. Left ventricular ejection fraction, by estimation, is 45 to 50%. The  left ventricle has mildly decreased function. The left ventricle  demonstrates global hypokinesis. There is  severe asymmetric left ventricular hypertrophy of the basal-septal  segment. Left ventricular diastolic parameters are consistent with Grade I  diastolic dysfunction (impaired relaxation).   2. Right ventricular systolic function is normal. The right ventricular  size is normal. There is normal pulmonary artery systolic pressure.   3. The mitral valve is normal in structure. No evidence of mitral valve  regurgitation. No evidence of mitral stenosis.   4. TAVR mean gradient was 20 mmHg and is now 18 mmHg. Gradient stable.  The aortic valve has been repaired/replaced. Aortic valve regurgitation is  not visualized. No aortic stenosis is present. There is a 29 mm Sapien  prosthetic (TAVR) valve present in  the aortic position. Procedure Date: 05/29/15. Aortic valve area, by VTI  measures 2.29 cm. Aortic valve mean gradient measures 18.0 mmHg. Aortic  valve Vmax measures 2.84 m/s.   5. Ascending aorta has increased from 3.9 cm 02/2018 to 4.5 cm now. Aortic  dilatation noted. There is mild dilatation of the aortic root, measuring  37 mm. There is moderate dilatation of the ascending aorta, measuring 45   mm.   6. The inferior vena cava is normal in size with greater than 50%  respiratory variability, suggesting right atrial pressure of 3 mmHg.   Comparison(s): 02/22/18 EF 35-40%. AV 87mHg mean PG, 340mg peak PG.   FINDINGS   Left Ventricle: Severe hypertrophy of the basal septum with otherwise  mild concentric LVH. Left ventricular ejection fraction, by estimation, is  45 to 50%. The left ventricle has mildly decreased function. The left  ventricle demonstrates global  hypokinesis. The left ventricular internal cavity size was normal in size.  There is severe asymmetric left ventricular hypertrophy of the  basal-septal segment. Left ventricular diastolic parameters are consistent  with Grade I diastolic dysfunction  (impaired relaxation). Indeterminate filling pressures.   Right Ventricle: The right ventricular size is normal. No increase in  right ventricular wall thickness. Right ventricular systolic function is  normal. There is normal pulmonary artery systolic pressure. The tricuspid  regurgitant velocity is 2.46 m/s, and   with an assumed right atrial pressure of 3 mmHg, the estimated right  ventricular systolic pressure is 2761.6mHg.   Left Atrium: Left atrial size was normal in size.   Right Atrium: Right atrial size was normal in size.   Pericardium: There is no evidence of pericardial effusion.   Mitral Valve: The mitral valve is normal in structure. There is mild  thickening of the mitral valve leaflet(s). No evidence of mitral valve  regurgitation. No evidence of mitral valve stenosis.   Tricuspid Valve: The tricuspid valve is normal in structure. Tricuspid  valve regurgitation is mild . No evidence of tricuspid stenosis.   Aortic Valve: TAVR mean gradient was 20 mmHg and is now 18 mmHg. Gradient  stable. The aortic valve has been repaired/replaced. Aortic valve  regurgitation is not visualized. No aortic stenosis is present. Aortic  valve mean gradient measures  18.0 mmHg.  Aortic valve peak gradient measures 32.2 mmHg. Aortic valve area, by VTI  measures 2.29 cm. There is a 29 mm Sapien prosthetic, stented (TAVR)  valve present in the aortic position. Procedure Date: 05/29/15.   Pulmonic Valve: The pulmonic valve was normal in structure. Pulmonic valve  regurgitation is mild. No evidence of pulmonic stenosis.   Aorta: Ascending aorta has increased from 3.9 cm 02/2018 to 4.5 cm now.  Aortic dilatation noted. There is mild dilatation of the aortic root,  measuring 37 mm. There  is moderate dilatation of the ascending aorta,  measuring 45 mm.   Venous: The inferior vena cava is normal in size with greater than 50%  respiratory variability, suggesting right atrial pressure of 3 mmHg.   IAS/Shunts: No atrial level shunt detected by color flow Doppler.     LEFT VENTRICLE  PLAX 2D  LVIDd:         4.20 cm   Diastology  LVIDs:         3.20 cm   LV e' medial:    5.25 cm/s  LV PW:         1.20 cm   LV E/e' medial:  13.0  LV IVS:        1.73 cm   LV e' lateral:   3.15 cm/s  LVOT diam:     2.50 cm   LV E/e' lateral: 21.6  LV SV:         146  LV SV Index:   67  LVOT Area:     4.91 cm     RIGHT VENTRICLE  RV Basal diam:  4.30 cm  RV S prime:     12.90 cm/s  TAPSE (M-mode): 2.6 cm  RVSP:           27.2 mmHg   LEFT ATRIUM             Index        RIGHT ATRIUM           Index  LA diam:        4.40 cm 2.01 cm/m   RA Pressure: 3.00 mmHg  LA Vol (A2C):   45.0 ml 20.53 ml/m  RA Area:     19.80 cm  LA Vol (A4C):   48.0 ml 21.90 ml/m  RA Volume:   45.80 ml  20.90 ml/m  LA Biplane Vol: 49.3 ml 22.49 ml/m   AORTIC VALVE  AV Area (Vmax):    2.41 cm  AV Area (Vmean):   2.30 cm  AV Area (VTI):     2.29 cm  AV Vmax:           283.67 cm/s  AV Vmean:          195.333 cm/s  AV VTI:            0.639 m  AV Peak Grad:      32.2 mmHg  AV Mean Grad:      18.0 mmHg  LVOT Vmax:         139.00 cm/s  LVOT Vmean:        91.400 cm/s  LVOT VTI:           0.298 m  LVOT/AV VTI ratio: 0.47    AORTA  Ao Root diam: 3.70 cm  Ao Asc diam:  4.50 cm   MV E velocity: 68.03 cm/s  TRICUSPID VALVE  MV A velocity: 96.70 cm/s  TR Peak grad:   24.2 mmHg  MV E/A ratio:  0.70        TR Vmax:        246.00 cm/s                             Estimated RAP:  3.00 mmHg                             RVSP:  27.2 mmHg                               SHUNTS                             Systemic VTI:  0.30 m                             Systemic Diam: 2.50 cm   Skeet Latch MD  Electronically signed by Skeet Latch MD  Signature Date/Time: 12/20/2021/3:59:23 PM        Final     Impression:  This 88 year old gentleman is almost 7 years following TAVR.  His recent echocardiogram showed an ascending aortic diameter of 4.5 cm which was felt to be significantly enlarged compared to his previous echo in January 2021 it was measured at 3.9 cm.  He had a CTA of the chest, abdomen, and pelvis in December 2021 which I personally reviewed and the ascending aortic diameter at that time was 3.9 cm.  I think it is difficult to compare the 2 echocardiograms because there is some margin of error between them.  His ascending aortic diameter is still well below the surgical threshold of 5.5 cm.  Given his advanced age of 54 with frailty, ongoing significant weight loss, and poor mobility I do not think he would be a candidate for open surgical replacement of his aorta even if it got up to 5.5 cm.  I reviewed the echo and CT images with the patient and his wife and answered their questions.  I stressed the importance of good blood pressure control in preventing further enlargement of his aorta and acute aortic dissection.  I do not think there is a need to follow this with periodic CT scans since it would not change the management and it is stressful for him to go anywhere out of the house.  I think the aortic diameter can be followed with periodic echocardiogram as ordered  by Dr. Radford Pax but again it is not going to change the management which is nonsurgical in this patient.  I think the risk of his aorta enlarging to 5.5 cm is still very low considering his advanced age.  Plan:  He will continue to follow-up with his PCP and Dr. Radford Pax.  I will be happy to see the patient back if the need arises but treatment of his ascending aortic aneurysm will continue to be nonsurgical.  He and his wife are in agreement with that plan.  I spent 30 minutes performing this consultation and > 50% of this time was spent face to face counseling and coordinating the care of this patient's ascending aortic aneurysm.   Gaye Pollack, MD [facility] [phone]

## 2022-05-14 DIAGNOSIS — R8290 Unspecified abnormal findings in urine: Secondary | ICD-10-CM | POA: Diagnosis not present

## 2022-06-09 ENCOUNTER — Other Ambulatory Visit: Payer: Self-pay | Admitting: Cardiology

## 2022-07-18 DIAGNOSIS — I5030 Unspecified diastolic (congestive) heart failure: Secondary | ICD-10-CM | POA: Diagnosis not present

## 2022-07-18 DIAGNOSIS — R531 Weakness: Secondary | ICD-10-CM | POA: Diagnosis not present

## 2022-07-18 DIAGNOSIS — I2510 Atherosclerotic heart disease of native coronary artery without angina pectoris: Secondary | ICD-10-CM | POA: Diagnosis not present

## 2022-07-18 DIAGNOSIS — F0150 Vascular dementia without behavioral disturbance: Secondary | ICD-10-CM | POA: Diagnosis not present

## 2022-07-18 DIAGNOSIS — R634 Abnormal weight loss: Secondary | ICD-10-CM | POA: Diagnosis not present

## 2022-07-18 DIAGNOSIS — M150 Primary generalized (osteo)arthritis: Secondary | ICD-10-CM | POA: Diagnosis not present

## 2022-07-19 ENCOUNTER — Other Ambulatory Visit: Payer: Self-pay | Admitting: Cardiology

## 2022-07-21 ENCOUNTER — Other Ambulatory Visit: Payer: Self-pay

## 2022-07-21 ENCOUNTER — Emergency Department (HOSPITAL_COMMUNITY)
Admission: EM | Admit: 2022-07-21 | Discharge: 2022-07-21 | Disposition: A | Discharge status: Home / Self Care | Payer: Medicare HMO | Attending: Emergency Medicine | Admitting: Emergency Medicine

## 2022-07-21 DIAGNOSIS — Z7902 Long term (current) use of antithrombotics/antiplatelets: Secondary | ICD-10-CM | POA: Diagnosis not present

## 2022-07-21 DIAGNOSIS — Z7982 Long term (current) use of aspirin: Secondary | ICD-10-CM | POA: Diagnosis not present

## 2022-07-21 DIAGNOSIS — K648 Other hemorrhoids: Secondary | ICD-10-CM | POA: Insufficient documentation

## 2022-07-21 DIAGNOSIS — R58 Hemorrhage, not elsewhere classified: Secondary | ICD-10-CM | POA: Diagnosis not present

## 2022-07-21 DIAGNOSIS — I10 Essential (primary) hypertension: Secondary | ICD-10-CM | POA: Insufficient documentation

## 2022-07-21 DIAGNOSIS — Z7901 Long term (current) use of anticoagulants: Secondary | ICD-10-CM | POA: Diagnosis not present

## 2022-07-21 DIAGNOSIS — K625 Hemorrhage of anus and rectum: Secondary | ICD-10-CM | POA: Diagnosis not present

## 2022-07-21 DIAGNOSIS — Z79899 Other long term (current) drug therapy: Secondary | ICD-10-CM | POA: Diagnosis not present

## 2022-07-21 DIAGNOSIS — I959 Hypotension, unspecified: Secondary | ICD-10-CM | POA: Diagnosis not present

## 2022-07-21 DIAGNOSIS — Z91040 Latex allergy status: Secondary | ICD-10-CM | POA: Diagnosis not present

## 2022-07-21 DIAGNOSIS — K921 Melena: Secondary | ICD-10-CM | POA: Diagnosis not present

## 2022-07-21 LAB — CBC
HCT: 51.8 % (ref 39.0–52.0)
Hemoglobin: 17.2 g/dL — ABNORMAL HIGH (ref 13.0–17.0)
MCH: 30.9 pg (ref 26.0–34.0)
MCHC: 33.2 g/dL (ref 30.0–36.0)
MCV: 93.2 fL (ref 80.0–100.0)
Platelets: 238 10*3/uL (ref 150–400)
RBC: 5.56 MIL/uL (ref 4.22–5.81)
RDW: 13.1 % (ref 11.5–15.5)
WBC: 5.4 10*3/uL (ref 4.0–10.5)
nRBC: 0 % (ref 0.0–0.2)

## 2022-07-21 LAB — COMPREHENSIVE METABOLIC PANEL
ALT: 18 U/L (ref 0–44)
AST: 21 U/L (ref 15–41)
Albumin: 3.9 g/dL (ref 3.5–5.0)
Alkaline Phosphatase: 108 U/L (ref 38–126)
Anion gap: 9 (ref 5–15)
BUN: 11 mg/dL (ref 8–23)
CO2: 24 mmol/L (ref 22–32)
Calcium: 9.3 mg/dL (ref 8.9–10.3)
Chloride: 103 mmol/L (ref 98–111)
Creatinine, Ser: 0.91 mg/dL (ref 0.61–1.24)
GFR, Estimated: >60 mL/min (ref >60)
Glucose, Bld: 137 mg/dL — ABNORMAL HIGH (ref 70–99)
Potassium: 3.7 mmol/L (ref 3.5–5.1)
Sodium: 136 mmol/L (ref 135–145)
Total Bilirubin: 1 mg/dL (ref 0.3–1.2)
Total Protein: 7.2 g/dL (ref 6.5–8.1)

## 2022-07-21 LAB — TYPE AND SCREEN
ABO/RH(D): O POS
Antibody Screen: NEGATIVE

## 2022-07-21 LAB — POC OCCULT BLOOD, ED: Fecal Occult Bld: POSITIVE — AB

## 2022-07-21 MED ORDER — DOCUSATE SODIUM 100 MG PO CAPS: 100.0000 mg | ORAL_CAPSULE | Freq: Two times a day (BID) | ORAL | 0 refills | Status: AC

## 2022-07-21 MED ORDER — SODIUM CHLORIDE 0.9 % IV BOLUS
500.0000 mL | Freq: Once | INTRAVENOUS | Status: AC
  Administered 2022-07-21: 500 mL via INTRAVENOUS

## 2022-07-21 MED ORDER — HYDROCORTISONE ACETATE 25 MG RE SUPP: 25.0000 mg | Freq: Two times a day (BID) | RECTAL | 0 refills | Status: AC

## 2022-07-21 NOTE — Discharge Instructions (Signed)
Follow up with Dr. Robyne Peers or one of her associates with in a week..   drink plenty of fluids

## 2022-07-21 NOTE — ED Triage Notes (Signed)
Pt arrived [facility] with c/o rectal bleeding that started today. Pt has a hx of hemorrhoids and is on a blood thinner. Pt also c/o rectum hurting.

## 2022-07-21 NOTE — ED Provider Notes (Signed)
EMERGENCY DEPARTMENT AT [facility] Provider Note   CSN: [id number] Arrival date & time: 07/21/22  1410     History {Add pertinent medical, surgical, social history, OB history to HPI:1} Chief Complaint  Patient presents with   Hematochezia    [name] is a 89 y.o. male.  Patient presents with rectal bleeding and has a history of hypertension.   Rectal Bleeding      Home Medications Prior to Admission medications   Medication Sig Start Date End Date Taking? Authorizing Provider  docusate sodium (COLACE) 100 MG capsule Take 1 capsule (100 mg total) by mouth every 12 (twelve) hours. 07/21/22  Yes Bethann Berkshire, MD  hydrocortisone (ANUSOL-HC) 25 MG suppository Place 1 suppository (25 mg total) rectally 2 (two) times daily. For 7 days 07/21/22  Yes Bethann Berkshire, MD  acetaminophen (TYLENOL) 325 MG tablet Take 650 mg by mouth every 6 (six) hours as needed for mild pain.    [provider]  amLODipine (NORVASC) 5 MG tablet TAKE 1 TABLET EVERY DAY 07/21/22   Quintella Reichert, MD  amoxicillin (AMOXIL) 500 MG tablet Take 4 tablets (2g) one hour prior to dental procedure. Patient not taking: Reported on 01/29/2022 05/03/20   Quintella Reichert, MD  aspirin 81 MG tablet Take 81 mg by mouth daily.    [provider]  carvedilol (COREG) 12.5 MG tablet TAKE 1 TABLET TWICE DAILY WITH MEALS 06/09/22   Quintella Reichert, MD  clopidogrel (PLAVIX) 75 MG tablet Take 1 tablet (75 mg total) by mouth daily. 05/14/15   Quintella Reichert, MD  diclofenac sodium (VOLTAREN) 1 % GEL Apply 2 g topically 4 (four) times daily. 02/26/17   Marcello Fennel, MD  finasteride (PROSCAR) 5 MG tablet Take 5 mg by mouth daily.    [provider]  furosemide (LASIX) 20 MG tablet TAKE 1 TABLET EVERY DAY 06/09/22   Quintella Reichert, MD  memantine (NAMENDA) 10 MG tablet Take 20 mg by mouth daily. 01/04/16   [provider]  montelukast (SINGULAIR) 10 MG tablet Take 10 mg  by mouth daily. 02/16/15   [provider]  Multiple Vitamin (MULTIVITAMIN WITH MINERALS) TABS Take 1 tablet by mouth daily.    [provider]  nitroGLYCERIN (NITROSTAT) 0.4 MG SL tablet Take 0.4 mg by mouth as needed. Place one tablet under your tongue every 5 minutes as needed for chest pain    [provider]  omeprazole (PRILOSEC) 40 MG capsule Take 1 capsule (40 mg total) by mouth daily. 12/04/21   Quintella Reichert, MD  potassium chloride SA (KLOR-CON M) 20 MEQ tablet Take 2 tablets (40 mEq total) by mouth daily. 01/13/22   Quintella Reichert, MD  rosuvastatin (CRESTOR) 40 MG tablet Take 1 tablet (40 mg total) by mouth daily. 10/29/21   Quintella Reichert, MD  sertraline (ZOLOFT) 50 MG tablet Take 50 mg by mouth daily.    [provider]      Allergies    Gabapentin, Other, Pregabalin, Latex, Sulfa antibiotics, and Sulfasalazine    Review of Systems   Review of Systems  Gastrointestinal:  Positive for hematochezia.    Physical Exam Updated Vital Signs BP (!) 136/100   Pulse 81   Temp 98.3 F (36.8 C) (Oral)   Resp (!) 21   Ht 6\' 3"  (1.905 m)   Wt 72.6 kg   SpO2 97%   BMI 20.00 kg/m  Physical Exam  ED  Results / Procedures / Treatments   Labs (all labs ordered are listed, but only abnormal results are displayed) Labs Reviewed  COMPREHENSIVE METABOLIC PANEL - Abnormal; Notable for the following components:      Result Value   Glucose, Bld 137 (*)    All other components within normal limits  CBC - Abnormal; Notable for the following components:   Hemoglobin 17.2 (*)    All other components within normal limits  POC OCCULT BLOOD, ED - Abnormal; Notable for the following components:   Fecal Occult Bld POSITIVE (*)    All other components within normal limits  TYPE AND SCREEN    EKG None  Radiology No results found.  Procedures Procedures  {Document cardiac monitor, telemetry assessment procedure when appropriate:1}  Medications  Ordered in ED Medications  sodium chloride 0.9 % bolus 500 mL (500 mLs Intravenous New Bag/Given 07/21/22 1704)    ED Course/ Medical Decision Making/ A&P   {   Click here for ABCD2, HEART and other calculatorsREFRESH Note before signing :1}                          Medical Decision Making Amount and/or Complexity of Data Reviewed Labs: ordered.  Risk OTC drugs. Prescription drug management.   Patient with mild prolapse of the rectum with significant hemorrhoids.  He is put on Anusol HC cream and Colace and will follow-up with surgery  {Document critical care time when appropriate:1} {Document review of labs and clinical decision tools ie heart score, Chads2Vasc2 etc:1}  {Document your independent review of radiology images, and any outside records:1} {Document your discussion with family members, caretakers, and with consultants:1} {Document social determinants of health affecting pt's care:1} {Document your decision making why or why not admission, treatments were needed:1} Final Clinical Impression(s) / ED Diagnoses Final diagnoses:  None    Rx / DC Orders ED Discharge Orders          Ordered    docusate sodium (COLACE) 100 MG capsule  Every 12 hours        07/21/22 1842    hydrocortisone (ANUSOL-HC) 25 MG suppository  2 times daily        07/21/22 1842

## 2022-07-21 NOTE — ED Notes (Signed)
Sugar applied to rectum.

## 2022-07-24 DIAGNOSIS — M150 Primary generalized (osteo)arthritis: Secondary | ICD-10-CM | POA: Diagnosis not present

## 2022-07-24 DIAGNOSIS — I2510 Atherosclerotic heart disease of native coronary artery without angina pectoris: Secondary | ICD-10-CM | POA: Diagnosis not present

## 2022-07-24 DIAGNOSIS — F0150 Vascular dementia without behavioral disturbance: Secondary | ICD-10-CM | POA: Diagnosis not present

## 2022-07-24 DIAGNOSIS — N39498 Other specified urinary incontinence: Secondary | ICD-10-CM | POA: Diagnosis not present

## 2022-07-24 DIAGNOSIS — I5030 Unspecified diastolic (congestive) heart failure: Secondary | ICD-10-CM | POA: Diagnosis not present

## 2022-07-24 DIAGNOSIS — I110 Hypertensive heart disease with heart failure: Secondary | ICD-10-CM | POA: Diagnosis not present

## 2022-07-24 DIAGNOSIS — N401 Enlarged prostate with lower urinary tract symptoms: Secondary | ICD-10-CM | POA: Diagnosis not present

## 2022-07-24 DIAGNOSIS — I352 Nonrheumatic aortic (valve) stenosis with insufficiency: Secondary | ICD-10-CM | POA: Diagnosis not present

## 2022-07-24 DIAGNOSIS — N3281 Overactive bladder: Secondary | ICD-10-CM | POA: Diagnosis not present

## 2022-07-25 DIAGNOSIS — I352 Nonrheumatic aortic (valve) stenosis with insufficiency: Secondary | ICD-10-CM | POA: Diagnosis not present

## 2022-07-25 DIAGNOSIS — I110 Hypertensive heart disease with heart failure: Secondary | ICD-10-CM | POA: Diagnosis not present

## 2022-07-25 DIAGNOSIS — M150 Primary generalized (osteo)arthritis: Secondary | ICD-10-CM | POA: Diagnosis not present

## 2022-07-25 DIAGNOSIS — N3281 Overactive bladder: Secondary | ICD-10-CM | POA: Diagnosis not present

## 2022-07-25 DIAGNOSIS — I5030 Unspecified diastolic (congestive) heart failure: Secondary | ICD-10-CM | POA: Diagnosis not present

## 2022-07-25 DIAGNOSIS — N39498 Other specified urinary incontinence: Secondary | ICD-10-CM | POA: Diagnosis not present

## 2022-07-25 DIAGNOSIS — I2510 Atherosclerotic heart disease of native coronary artery without angina pectoris: Secondary | ICD-10-CM | POA: Diagnosis not present

## 2022-07-25 DIAGNOSIS — N401 Enlarged prostate with lower urinary tract symptoms: Secondary | ICD-10-CM | POA: Diagnosis not present

## 2022-07-25 DIAGNOSIS — F0150 Vascular dementia without behavioral disturbance: Secondary | ICD-10-CM | POA: Diagnosis not present

## 2022-07-30 DIAGNOSIS — I2510 Atherosclerotic heart disease of native coronary artery without angina pectoris: Secondary | ICD-10-CM | POA: Diagnosis not present

## 2022-07-30 DIAGNOSIS — I5030 Unspecified diastolic (congestive) heart failure: Secondary | ICD-10-CM | POA: Diagnosis not present

## 2022-07-30 DIAGNOSIS — N3281 Overactive bladder: Secondary | ICD-10-CM | POA: Diagnosis not present

## 2022-07-30 DIAGNOSIS — I352 Nonrheumatic aortic (valve) stenosis with insufficiency: Secondary | ICD-10-CM | POA: Diagnosis not present

## 2022-07-30 DIAGNOSIS — N401 Enlarged prostate with lower urinary tract symptoms: Secondary | ICD-10-CM | POA: Diagnosis not present

## 2022-07-30 DIAGNOSIS — M150 Primary generalized (osteo)arthritis: Secondary | ICD-10-CM | POA: Diagnosis not present

## 2022-07-30 DIAGNOSIS — N39498 Other specified urinary incontinence: Secondary | ICD-10-CM | POA: Diagnosis not present

## 2022-07-30 DIAGNOSIS — I110 Hypertensive heart disease with heart failure: Secondary | ICD-10-CM | POA: Diagnosis not present

## 2022-07-30 DIAGNOSIS — F0150 Vascular dementia without behavioral disturbance: Secondary | ICD-10-CM | POA: Diagnosis not present

## 2022-08-04 DIAGNOSIS — I10 Essential (primary) hypertension: Secondary | ICD-10-CM | POA: Diagnosis not present

## 2022-08-04 DIAGNOSIS — I2510 Atherosclerotic heart disease of native coronary artery without angina pectoris: Secondary | ICD-10-CM | POA: Diagnosis not present

## 2022-08-04 DIAGNOSIS — I5030 Unspecified diastolic (congestive) heart failure: Secondary | ICD-10-CM | POA: Diagnosis not present

## 2022-08-04 DIAGNOSIS — M19011 Primary osteoarthritis, right shoulder: Secondary | ICD-10-CM | POA: Diagnosis not present

## 2022-08-04 DIAGNOSIS — J45909 Unspecified asthma, uncomplicated: Secondary | ICD-10-CM | POA: Diagnosis not present

## 2022-08-04 DIAGNOSIS — I25119 Atherosclerotic heart disease of native coronary artery with unspecified angina pectoris: Secondary | ICD-10-CM | POA: Diagnosis not present

## 2022-08-04 DIAGNOSIS — F0150 Vascular dementia without behavioral disturbance: Secondary | ICD-10-CM | POA: Diagnosis not present

## 2022-08-04 DIAGNOSIS — E7800 Pure hypercholesterolemia, unspecified: Secondary | ICD-10-CM | POA: Diagnosis not present

## 2022-08-05 DIAGNOSIS — I2510 Atherosclerotic heart disease of native coronary artery without angina pectoris: Secondary | ICD-10-CM | POA: Diagnosis not present

## 2022-08-05 DIAGNOSIS — N39498 Other specified urinary incontinence: Secondary | ICD-10-CM | POA: Diagnosis not present

## 2022-08-05 DIAGNOSIS — N3281 Overactive bladder: Secondary | ICD-10-CM | POA: Diagnosis not present

## 2022-08-05 DIAGNOSIS — M150 Primary generalized (osteo)arthritis: Secondary | ICD-10-CM | POA: Diagnosis not present

## 2022-08-05 DIAGNOSIS — I352 Nonrheumatic aortic (valve) stenosis with insufficiency: Secondary | ICD-10-CM | POA: Diagnosis not present

## 2022-08-05 DIAGNOSIS — N401 Enlarged prostate with lower urinary tract symptoms: Secondary | ICD-10-CM | POA: Diagnosis not present

## 2022-08-05 DIAGNOSIS — I110 Hypertensive heart disease with heart failure: Secondary | ICD-10-CM | POA: Diagnosis not present

## 2022-08-05 DIAGNOSIS — F0150 Vascular dementia without behavioral disturbance: Secondary | ICD-10-CM | POA: Diagnosis not present

## 2022-08-05 DIAGNOSIS — I5030 Unspecified diastolic (congestive) heart failure: Secondary | ICD-10-CM | POA: Diagnosis not present

## 2022-08-12 DIAGNOSIS — I2510 Atherosclerotic heart disease of native coronary artery without angina pectoris: Secondary | ICD-10-CM | POA: Diagnosis not present

## 2022-08-12 DIAGNOSIS — I5030 Unspecified diastolic (congestive) heart failure: Secondary | ICD-10-CM | POA: Diagnosis not present

## 2022-08-12 DIAGNOSIS — F0150 Vascular dementia without behavioral disturbance: Secondary | ICD-10-CM | POA: Diagnosis not present

## 2022-08-12 DIAGNOSIS — M150 Primary generalized (osteo)arthritis: Secondary | ICD-10-CM | POA: Diagnosis not present

## 2022-08-12 DIAGNOSIS — N39498 Other specified urinary incontinence: Secondary | ICD-10-CM | POA: Diagnosis not present

## 2022-08-12 DIAGNOSIS — I110 Hypertensive heart disease with heart failure: Secondary | ICD-10-CM | POA: Diagnosis not present

## 2022-08-12 DIAGNOSIS — N401 Enlarged prostate with lower urinary tract symptoms: Secondary | ICD-10-CM | POA: Diagnosis not present

## 2022-08-12 DIAGNOSIS — N3281 Overactive bladder: Secondary | ICD-10-CM | POA: Diagnosis not present

## 2022-08-12 DIAGNOSIS — I352 Nonrheumatic aortic (valve) stenosis with insufficiency: Secondary | ICD-10-CM | POA: Diagnosis not present

## 2022-08-20 DIAGNOSIS — F0150 Vascular dementia without behavioral disturbance: Secondary | ICD-10-CM | POA: Diagnosis not present

## 2022-08-20 DIAGNOSIS — Z993 Dependence on wheelchair: Secondary | ICD-10-CM | POA: Diagnosis not present

## 2022-08-20 DIAGNOSIS — M150 Primary generalized (osteo)arthritis: Secondary | ICD-10-CM | POA: Diagnosis not present

## 2022-08-20 DIAGNOSIS — I110 Hypertensive heart disease with heart failure: Secondary | ICD-10-CM | POA: Diagnosis not present

## 2022-08-20 DIAGNOSIS — E7800 Pure hypercholesterolemia, unspecified: Secondary | ICD-10-CM | POA: Diagnosis not present

## 2022-08-20 DIAGNOSIS — R54 Age-related physical debility: Secondary | ICD-10-CM | POA: Diagnosis not present

## 2022-08-20 DIAGNOSIS — I700 Atherosclerosis of aorta: Secondary | ICD-10-CM | POA: Diagnosis not present

## 2022-08-20 DIAGNOSIS — I5030 Unspecified diastolic (congestive) heart failure: Secondary | ICD-10-CM | POA: Diagnosis not present

## 2022-08-22 ENCOUNTER — Other Ambulatory Visit: Payer: Self-pay | Admitting: Cardiology

## 2022-08-23 DIAGNOSIS — I110 Hypertensive heart disease with heart failure: Secondary | ICD-10-CM | POA: Diagnosis not present

## 2022-08-23 DIAGNOSIS — I5030 Unspecified diastolic (congestive) heart failure: Secondary | ICD-10-CM | POA: Diagnosis not present

## 2022-08-23 DIAGNOSIS — N3281 Overactive bladder: Secondary | ICD-10-CM | POA: Diagnosis not present

## 2022-08-23 DIAGNOSIS — N401 Enlarged prostate with lower urinary tract symptoms: Secondary | ICD-10-CM | POA: Diagnosis not present

## 2022-08-23 DIAGNOSIS — I352 Nonrheumatic aortic (valve) stenosis with insufficiency: Secondary | ICD-10-CM | POA: Diagnosis not present

## 2022-08-23 DIAGNOSIS — M150 Primary generalized (osteo)arthritis: Secondary | ICD-10-CM | POA: Diagnosis not present

## 2022-08-23 DIAGNOSIS — F0150 Vascular dementia without behavioral disturbance: Secondary | ICD-10-CM | POA: Diagnosis not present

## 2022-08-23 DIAGNOSIS — N39498 Other specified urinary incontinence: Secondary | ICD-10-CM | POA: Diagnosis not present

## 2022-08-23 DIAGNOSIS — I2510 Atherosclerotic heart disease of native coronary artery without angina pectoris: Secondary | ICD-10-CM | POA: Diagnosis not present

## 2022-08-25 DIAGNOSIS — I5030 Unspecified diastolic (congestive) heart failure: Secondary | ICD-10-CM | POA: Diagnosis not present

## 2022-08-25 DIAGNOSIS — I110 Hypertensive heart disease with heart failure: Secondary | ICD-10-CM | POA: Diagnosis not present

## 2022-08-25 DIAGNOSIS — I2510 Atherosclerotic heart disease of native coronary artery without angina pectoris: Secondary | ICD-10-CM | POA: Diagnosis not present

## 2022-08-25 DIAGNOSIS — N39498 Other specified urinary incontinence: Secondary | ICD-10-CM | POA: Diagnosis not present

## 2022-08-25 DIAGNOSIS — N3281 Overactive bladder: Secondary | ICD-10-CM | POA: Diagnosis not present

## 2022-08-25 DIAGNOSIS — N401 Enlarged prostate with lower urinary tract symptoms: Secondary | ICD-10-CM | POA: Diagnosis not present

## 2022-08-25 DIAGNOSIS — M150 Primary generalized (osteo)arthritis: Secondary | ICD-10-CM | POA: Diagnosis not present

## 2022-08-25 DIAGNOSIS — F0150 Vascular dementia without behavioral disturbance: Secondary | ICD-10-CM | POA: Diagnosis not present

## 2022-08-25 DIAGNOSIS — I352 Nonrheumatic aortic (valve) stenosis with insufficiency: Secondary | ICD-10-CM | POA: Diagnosis not present

## 2022-09-01 DIAGNOSIS — I352 Nonrheumatic aortic (valve) stenosis with insufficiency: Secondary | ICD-10-CM | POA: Diagnosis not present

## 2022-09-01 DIAGNOSIS — N401 Enlarged prostate with lower urinary tract symptoms: Secondary | ICD-10-CM | POA: Diagnosis not present

## 2022-09-01 DIAGNOSIS — N3281 Overactive bladder: Secondary | ICD-10-CM | POA: Diagnosis not present

## 2022-09-01 DIAGNOSIS — I2510 Atherosclerotic heart disease of native coronary artery without angina pectoris: Secondary | ICD-10-CM | POA: Diagnosis not present

## 2022-09-01 DIAGNOSIS — F0150 Vascular dementia without behavioral disturbance: Secondary | ICD-10-CM | POA: Diagnosis not present

## 2022-09-01 DIAGNOSIS — M150 Primary generalized (osteo)arthritis: Secondary | ICD-10-CM | POA: Diagnosis not present

## 2022-09-01 DIAGNOSIS — I110 Hypertensive heart disease with heart failure: Secondary | ICD-10-CM | POA: Diagnosis not present

## 2022-09-01 DIAGNOSIS — I5030 Unspecified diastolic (congestive) heart failure: Secondary | ICD-10-CM | POA: Diagnosis not present

## 2022-09-01 DIAGNOSIS — N39498 Other specified urinary incontinence: Secondary | ICD-10-CM | POA: Diagnosis not present

## 2022-10-02 ENCOUNTER — Other Ambulatory Visit: Payer: Self-pay | Admitting: Cardiology

## 2022-10-23 ENCOUNTER — Other Ambulatory Visit: Payer: Self-pay | Admitting: Cardiology

## 2022-11-03 ENCOUNTER — Other Ambulatory Visit: Payer: Self-pay | Admitting: Cardiology

## 2022-11-15 ENCOUNTER — Other Ambulatory Visit: Payer: Self-pay | Admitting: Cardiology

## 2022-11-25 ENCOUNTER — Other Ambulatory Visit: Payer: Self-pay | Admitting: Cardiology

## 2022-12-06 ENCOUNTER — Other Ambulatory Visit: Payer: Self-pay | Admitting: Cardiology

## 2023-01-28 DIAGNOSIS — I2510 Atherosclerotic heart disease of native coronary artery without angina pectoris: Secondary | ICD-10-CM | POA: Diagnosis not present

## 2023-01-28 DIAGNOSIS — K219 Gastro-esophageal reflux disease without esophagitis: Secondary | ICD-10-CM | POA: Diagnosis not present

## 2023-01-28 DIAGNOSIS — E7800 Pure hypercholesterolemia, unspecified: Secondary | ICD-10-CM | POA: Diagnosis not present

## 2023-01-28 DIAGNOSIS — F0150 Vascular dementia without behavioral disturbance: Secondary | ICD-10-CM | POA: Diagnosis not present

## 2023-01-28 DIAGNOSIS — Z0000 Encounter for general adult medical examination without abnormal findings: Secondary | ICD-10-CM | POA: Diagnosis not present

## 2023-01-28 DIAGNOSIS — I110 Hypertensive heart disease with heart failure: Secondary | ICD-10-CM | POA: Diagnosis not present

## 2023-01-28 DIAGNOSIS — I5030 Unspecified diastolic (congestive) heart failure: Secondary | ICD-10-CM | POA: Diagnosis not present

## 2023-01-28 DIAGNOSIS — I7121 Aneurysm of the ascending aorta, without rupture: Secondary | ICD-10-CM | POA: Diagnosis not present

## 2023-01-28 DIAGNOSIS — I700 Atherosclerosis of aorta: Secondary | ICD-10-CM | POA: Diagnosis not present

## 2023-01-28 LAB — LAB REPORT - SCANNED: EGFR: 90

## 2023-01-30 ENCOUNTER — Telehealth: Payer: Self-pay

## 2023-01-30 NOTE — Telephone Encounter (Signed)
Call to patient to discuss lab results. Spoke to [facility] [employer] ([facility]) and advised that patient is very close to LDL goal and Dr. Mayford Knife recommends he continue his medications and work on a low fat diet.Noticed that patient has no f/u scheduled, appears to be overdue for 1 year f/u from 10/2022. Scheduled patient for appt in March as spouse is completed an orthopedic surgery at in early 2025.

## 2023-01-30 NOTE — Telephone Encounter (Signed)
-----   Message from Armanda Magic sent at 01/29/2023 11:17 AM EST ----- LDL 75 and goal < 70 - please work on low fat diet and continue current dose of statin

## 2023-05-07 ENCOUNTER — Ambulatory Visit: Payer: Medicare HMO | Admitting: Cardiology

## 2023-11-06 DIAGNOSIS — R627 Adult failure to thrive: Secondary | ICD-10-CM | POA: Diagnosis not present

## 2023-11-06 DIAGNOSIS — R4182 Altered mental status, unspecified: Secondary | ICD-10-CM | POA: Diagnosis not present

## 2023-11-11 DIAGNOSIS — R531 Weakness: Secondary | ICD-10-CM | POA: Diagnosis not present

## 2023-11-11 DIAGNOSIS — R410 Disorientation, unspecified: Secondary | ICD-10-CM | POA: Diagnosis not present

## 2023-11-11 DIAGNOSIS — Z7401 Bed confinement status: Secondary | ICD-10-CM | POA: Diagnosis not present

## 2024-01-11 DEATH — deceased
